# Patient Record
Sex: Female | Born: 1950
Health system: Southern US, Community
[De-identification: ages and names within clinical notes are randomized; demographics above are authoritative.]

## PROBLEM LIST (undated history)

## (undated) DIAGNOSIS — G8929 Other chronic pain: Secondary | ICD-10-CM

## (undated) DIAGNOSIS — R011 Cardiac murmur, unspecified: Secondary | ICD-10-CM

## (undated) DIAGNOSIS — S32599A Other specified fracture of unspecified pubis, initial encounter for closed fracture: Secondary | ICD-10-CM

## (undated) DIAGNOSIS — M3500 Sicca syndrome, unspecified: Secondary | ICD-10-CM

## (undated) DIAGNOSIS — M545 Low back pain, unspecified: Secondary | ICD-10-CM

## (undated) DIAGNOSIS — F329 Major depressive disorder, single episode, unspecified: Secondary | ICD-10-CM

## (undated) DIAGNOSIS — I499 Cardiac arrhythmia, unspecified: Secondary | ICD-10-CM

## (undated) DIAGNOSIS — F32A Depression, unspecified: Secondary | ICD-10-CM

## (undated) DIAGNOSIS — G2581 Restless legs syndrome: Secondary | ICD-10-CM

## (undated) DIAGNOSIS — R059 Cough, unspecified: Secondary | ICD-10-CM

## (undated) DIAGNOSIS — I10 Essential (primary) hypertension: Secondary | ICD-10-CM

## (undated) DIAGNOSIS — R05 Cough: Secondary | ICD-10-CM

## (undated) DIAGNOSIS — R002 Palpitations: Secondary | ICD-10-CM

## (undated) DIAGNOSIS — R0600 Dyspnea, unspecified: Secondary | ICD-10-CM

## (undated) DIAGNOSIS — M1990 Unspecified osteoarthritis, unspecified site: Secondary | ICD-10-CM

## (undated) DIAGNOSIS — H2012 Chronic iridocyclitis, left eye: Secondary | ICD-10-CM

## (undated) DIAGNOSIS — K219 Gastro-esophageal reflux disease without esophagitis: Secondary | ICD-10-CM

## (undated) DIAGNOSIS — B029 Zoster without complications: Secondary | ICD-10-CM

## (undated) DIAGNOSIS — F0390 Unspecified dementia without behavioral disturbance: Secondary | ICD-10-CM

## (undated) DIAGNOSIS — F419 Anxiety disorder, unspecified: Secondary | ICD-10-CM

## (undated) DIAGNOSIS — J45909 Unspecified asthma, uncomplicated: Secondary | ICD-10-CM

## (undated) DIAGNOSIS — J449 Chronic obstructive pulmonary disease, unspecified: Secondary | ICD-10-CM

## (undated) DIAGNOSIS — N182 Chronic kidney disease, stage 2 (mild): Secondary | ICD-10-CM

## (undated) DIAGNOSIS — IMO0001 Reserved for inherently not codable concepts without codable children: Secondary | ICD-10-CM

## (undated) HISTORY — DX: Essential (primary) hypertension: I10

## (undated) HISTORY — DX: Cough, unspecified: R05.9

## (undated) HISTORY — PX: CARDIAC CATHETERIZATION: SHX172

## (undated) HISTORY — DX: Unspecified osteoarthritis, unspecified site: M19.90

## (undated) HISTORY — DX: Chronic iridocyclitis, left eye: H20.12

## (undated) HISTORY — DX: Low back pain, unspecified: M54.50

## (undated) HISTORY — DX: Palpitations: R00.2

## (undated) HISTORY — DX: Zoster without complications: B02.9

## (undated) HISTORY — DX: Low back pain: M54.5

## (undated) HISTORY — DX: Depression, unspecified: F32.A

## (undated) HISTORY — DX: Gastro-esophageal reflux disease without esophagitis: K21.9

## (undated) HISTORY — DX: Other chronic pain: G89.29

## (undated) HISTORY — DX: Unspecified dementia without behavioral disturbance: F03.90

## (undated) HISTORY — DX: Anxiety disorder, unspecified: F41.9

## (undated) HISTORY — DX: Unspecified asthma, uncomplicated: J45.909

## (undated) HISTORY — PX: BACK SURGERY: SHX140

## (undated) HISTORY — DX: Major depressive disorder, single episode, unspecified: F32.9

## (undated) HISTORY — DX: Chronic obstructive pulmonary disease, unspecified: J44.9

## (undated) HISTORY — DX: Restless legs syndrome: G25.81

## (undated) HISTORY — DX: Cough: R05

## (undated) HISTORY — PX: TUBAL LIGATION: SHX77

## (undated) HISTORY — DX: Sjogren syndrome, unspecified: M35.00

---

## 1999-06-04 ENCOUNTER — Encounter: Payer: Self-pay | Admitting: Family Medicine

## 1999-06-04 ENCOUNTER — Encounter: Admission: RE | Admit: 1999-06-04 | Discharge: 1999-06-04 | Payer: Self-pay | Admitting: Family Medicine

## 1999-06-24 ENCOUNTER — Encounter: Payer: Self-pay | Admitting: Neurosurgery

## 1999-06-24 ENCOUNTER — Ambulatory Visit (HOSPITAL_COMMUNITY): Admission: RE | Admit: 1999-06-24 | Discharge: 1999-06-24 | Payer: Self-pay | Admitting: Neurosurgery

## 1999-07-08 ENCOUNTER — Ambulatory Visit (HOSPITAL_COMMUNITY): Admission: RE | Admit: 1999-07-08 | Discharge: 1999-07-08 | Payer: Self-pay | Admitting: Neurosurgery

## 1999-07-08 ENCOUNTER — Encounter: Payer: Self-pay | Admitting: Neurosurgery

## 1999-07-28 ENCOUNTER — Ambulatory Visit (HOSPITAL_COMMUNITY): Admission: RE | Admit: 1999-07-28 | Discharge: 1999-07-28 | Payer: Self-pay | Admitting: Neurosurgery

## 1999-07-28 ENCOUNTER — Encounter: Payer: Self-pay | Admitting: Neurosurgery

## 1999-08-09 ENCOUNTER — Encounter: Payer: Self-pay | Admitting: Neurosurgery

## 1999-08-11 ENCOUNTER — Inpatient Hospital Stay (HOSPITAL_COMMUNITY): Admission: RE | Admit: 1999-08-11 | Discharge: 1999-08-15 | Payer: Self-pay | Admitting: Neurosurgery

## 1999-08-11 ENCOUNTER — Encounter: Payer: Self-pay | Admitting: Neurosurgery

## 1999-08-20 ENCOUNTER — Encounter: Admission: RE | Admit: 1999-08-20 | Discharge: 1999-08-20 | Payer: Self-pay | Admitting: Neurosurgery

## 1999-08-20 ENCOUNTER — Encounter: Payer: Self-pay | Admitting: Neurosurgery

## 1999-10-04 ENCOUNTER — Encounter: Admission: RE | Admit: 1999-10-04 | Discharge: 1999-10-04 | Payer: Self-pay | Admitting: Neurosurgery

## 1999-10-04 ENCOUNTER — Encounter: Payer: Self-pay | Admitting: Neurosurgery

## 1999-10-06 ENCOUNTER — Other Ambulatory Visit: Admission: RE | Admit: 1999-10-06 | Discharge: 1999-10-06 | Payer: Self-pay | Admitting: *Deleted

## 1999-11-26 ENCOUNTER — Encounter (INDEPENDENT_AMBULATORY_CARE_PROVIDER_SITE_OTHER): Payer: Self-pay

## 1999-11-26 ENCOUNTER — Other Ambulatory Visit: Admission: RE | Admit: 1999-11-26 | Discharge: 1999-11-26 | Payer: Self-pay | Admitting: Obstetrics and Gynecology

## 2000-01-12 ENCOUNTER — Encounter: Payer: Self-pay | Admitting: Neurosurgery

## 2000-01-12 ENCOUNTER — Encounter: Admission: RE | Admit: 2000-01-12 | Discharge: 2000-01-12 | Payer: Self-pay | Admitting: Neurosurgery

## 2000-03-16 ENCOUNTER — Ambulatory Visit (HOSPITAL_COMMUNITY): Admission: RE | Admit: 2000-03-16 | Discharge: 2000-03-16 | Payer: Self-pay | Admitting: Neurosurgery

## 2000-03-16 ENCOUNTER — Encounter: Payer: Self-pay | Admitting: Neurosurgery

## 2000-04-04 ENCOUNTER — Inpatient Hospital Stay (HOSPITAL_COMMUNITY): Admission: RE | Admit: 2000-04-04 | Discharge: 2000-04-08 | Payer: Self-pay | Admitting: Neurosurgery

## 2000-04-04 ENCOUNTER — Encounter: Payer: Self-pay | Admitting: Neurosurgery

## 2000-04-28 ENCOUNTER — Encounter: Admission: RE | Admit: 2000-04-28 | Discharge: 2000-04-28 | Payer: Self-pay | Admitting: Neurosurgery

## 2000-04-28 ENCOUNTER — Encounter: Payer: Self-pay | Admitting: Neurosurgery

## 2000-07-20 ENCOUNTER — Encounter: Payer: Self-pay | Admitting: Neurosurgery

## 2000-07-20 ENCOUNTER — Encounter: Admission: RE | Admit: 2000-07-20 | Discharge: 2000-07-20 | Payer: Self-pay | Admitting: Neurosurgery

## 2000-08-23 ENCOUNTER — Encounter: Payer: Self-pay | Admitting: Neurosurgery

## 2000-08-23 ENCOUNTER — Encounter: Admission: RE | Admit: 2000-08-23 | Discharge: 2000-08-23 | Payer: Self-pay | Admitting: Neurosurgery

## 2001-01-03 ENCOUNTER — Ambulatory Visit (HOSPITAL_COMMUNITY): Admission: RE | Admit: 2001-01-03 | Discharge: 2001-01-03 | Payer: Self-pay | Admitting: Gastroenterology

## 2001-06-29 ENCOUNTER — Other Ambulatory Visit: Admission: RE | Admit: 2001-06-29 | Discharge: 2001-06-29 | Payer: Self-pay | Admitting: Obstetrics and Gynecology

## 2003-01-30 ENCOUNTER — Other Ambulatory Visit: Admission: RE | Admit: 2003-01-30 | Discharge: 2003-01-30 | Payer: Self-pay | Admitting: Obstetrics and Gynecology

## 2003-07-30 ENCOUNTER — Encounter: Admission: RE | Admit: 2003-07-30 | Discharge: 2003-07-30 | Payer: Self-pay | Admitting: Obstetrics and Gynecology

## 2004-06-09 ENCOUNTER — Encounter: Admission: RE | Admit: 2004-06-09 | Discharge: 2004-06-09 | Payer: Self-pay | Admitting: Neurosurgery

## 2004-07-15 ENCOUNTER — Encounter: Admission: RE | Admit: 2004-07-15 | Discharge: 2004-07-15 | Payer: Self-pay | Admitting: Neurosurgery

## 2004-07-28 ENCOUNTER — Encounter: Admission: RE | Admit: 2004-07-28 | Discharge: 2004-07-28 | Payer: Self-pay | Admitting: Neurosurgery

## 2004-09-15 ENCOUNTER — Inpatient Hospital Stay (HOSPITAL_BASED_OUTPATIENT_CLINIC_OR_DEPARTMENT_OTHER): Admission: RE | Admit: 2004-09-15 | Discharge: 2004-09-15 | Payer: Self-pay | Admitting: Cardiovascular Disease

## 2005-04-08 ENCOUNTER — Ambulatory Visit: Payer: Self-pay | Admitting: Pulmonary Disease

## 2005-04-27 ENCOUNTER — Ambulatory Visit: Admission: RE | Admit: 2005-04-27 | Discharge: 2005-04-27 | Payer: Self-pay | Admitting: Pulmonary Disease

## 2005-04-27 LAB — PULMONARY FUNCTION TEST

## 2005-06-24 ENCOUNTER — Ambulatory Visit: Payer: Self-pay | Admitting: Pulmonary Disease

## 2005-09-22 ENCOUNTER — Ambulatory Visit: Payer: Self-pay | Admitting: Internal Medicine

## 2005-10-21 ENCOUNTER — Ambulatory Visit: Payer: Self-pay | Admitting: Internal Medicine

## 2005-11-22 ENCOUNTER — Encounter: Admission: RE | Admit: 2005-11-22 | Discharge: 2005-11-22 | Payer: Self-pay | Admitting: Neurosurgery

## 2006-06-26 ENCOUNTER — Encounter: Admission: RE | Admit: 2006-06-26 | Discharge: 2006-06-26 | Payer: Self-pay | Admitting: Obstetrics and Gynecology

## 2006-08-17 ENCOUNTER — Encounter: Admission: RE | Admit: 2006-08-17 | Discharge: 2006-08-17 | Payer: Self-pay | Admitting: Neurosurgery

## 2006-08-23 ENCOUNTER — Encounter: Admission: RE | Admit: 2006-08-23 | Discharge: 2006-08-23 | Payer: Self-pay | Admitting: Neurosurgery

## 2007-06-27 ENCOUNTER — Encounter: Admission: RE | Admit: 2007-06-27 | Discharge: 2007-06-27 | Payer: Self-pay | Admitting: Neurosurgery

## 2007-07-07 ENCOUNTER — Emergency Department (HOSPITAL_COMMUNITY): Admission: EM | Admit: 2007-07-07 | Discharge: 2007-07-07 | Payer: Self-pay | Admitting: Emergency Medicine

## 2007-07-19 ENCOUNTER — Encounter: Admission: RE | Admit: 2007-07-19 | Discharge: 2007-07-19 | Payer: Self-pay | Admitting: Obstetrics and Gynecology

## 2007-08-02 ENCOUNTER — Telehealth (INDEPENDENT_AMBULATORY_CARE_PROVIDER_SITE_OTHER): Payer: Self-pay | Admitting: *Deleted

## 2007-08-06 ENCOUNTER — Ambulatory Visit: Payer: Self-pay | Admitting: Internal Medicine

## 2007-08-06 DIAGNOSIS — R059 Cough, unspecified: Secondary | ICD-10-CM | POA: Insufficient documentation

## 2007-08-06 DIAGNOSIS — I10 Essential (primary) hypertension: Secondary | ICD-10-CM | POA: Insufficient documentation

## 2007-08-06 DIAGNOSIS — R05 Cough: Secondary | ICD-10-CM

## 2007-08-13 ENCOUNTER — Ambulatory Visit: Payer: Self-pay | Admitting: Internal Medicine

## 2007-10-26 ENCOUNTER — Encounter: Admission: RE | Admit: 2007-10-26 | Discharge: 2007-10-26 | Payer: Self-pay | Admitting: Neurosurgery

## 2009-03-30 ENCOUNTER — Encounter: Admission: RE | Admit: 2009-03-30 | Discharge: 2009-03-30 | Payer: Self-pay | Admitting: Neurosurgery

## 2009-06-08 ENCOUNTER — Encounter: Admission: RE | Admit: 2009-06-08 | Discharge: 2009-06-08 | Payer: Self-pay | Admitting: Family Medicine

## 2009-10-15 DIAGNOSIS — IMO0002 Reserved for concepts with insufficient information to code with codable children: Secondary | ICD-10-CM | POA: Insufficient documentation

## 2009-10-15 DIAGNOSIS — Z981 Arthrodesis status: Secondary | ICD-10-CM | POA: Insufficient documentation

## 2009-10-15 DIAGNOSIS — M4120 Other idiopathic scoliosis, site unspecified: Secondary | ICD-10-CM | POA: Insufficient documentation

## 2010-02-03 ENCOUNTER — Encounter: Admission: RE | Admit: 2010-02-03 | Discharge: 2010-02-03 | Payer: Self-pay | Admitting: Family Medicine

## 2010-05-05 ENCOUNTER — Encounter
Admission: RE | Admit: 2010-05-05 | Discharge: 2010-05-05 | Payer: Self-pay | Source: Home / Self Care | Attending: Neurosurgery | Admitting: Neurosurgery

## 2010-05-14 ENCOUNTER — Encounter
Admission: RE | Admit: 2010-05-14 | Discharge: 2010-05-14 | Payer: Self-pay | Source: Home / Self Care | Attending: Neurosurgery | Admitting: Neurosurgery

## 2010-06-02 ENCOUNTER — Encounter
Admission: RE | Admit: 2010-06-02 | Discharge: 2010-06-02 | Payer: Self-pay | Source: Home / Self Care | Attending: Neurosurgery | Admitting: Neurosurgery

## 2010-06-12 ENCOUNTER — Encounter: Payer: Self-pay | Admitting: Obstetrics and Gynecology

## 2010-06-13 ENCOUNTER — Encounter: Payer: Self-pay | Admitting: Neurosurgery

## 2010-10-08 NOTE — Discharge Summary (Signed)
[place]. [facility]  Patient:    Toni Myers, Toni Myers                         MRN: [id number] Adm. Date:  [id number] Disc. Date: 04/08/00 Attending:  Emeterio Reeve                           Discharge Summary  ADMISSION DIAGNOSES: 1. Chronic back pain. 2. Radiculopathy.  FINAL DIAGNOSES: 1. Chronic back pain. 2. Radiculopathy.  CLINICAL HISTORY:  The patient was admitted by Dr. Trey Sailors because of back and leg pain.  This patient had fusion at the level of 5-1 a year ago. Because of continuation of back pain, it was found that she had a ______ Surgery was advised.  LABORATORY DATA:  Normal.  COURSE IN HOSPITAL:  The patient was taken to surgery, and Dr. Channing Mutters proceeded with a fusion at the level of 5-1 using pedicle screw.  The patient right now is doing much better, although she has some weakness of the left foot. Nevertheless, she is ready to go home.  CONDITION UPON DISCHARGE:  Improvement.  DISCHARGE MEDICATIONS:  Percocet and diazepam.  DIET:  Regular.  ACTIVITY:  She is not to drive.  FOLLOWUP:  She is call Dr. Channing Mutters for followup appointment next week. DD:  04/08/00 TD:  04/08/00 Job: [id number]

## 2010-10-08 NOTE — H&P (Signed)
Toni Myers, Toni Myers                  ACCOUNT NO.:  [id number]   MEDICAL RECORD NO.:  [id number]          PATIENT TYPE:  AMB   LOCATION:                               FACILITY:  [facility]   PHYSICIAN:  Vesta Mixer, M.D. DATE OF BIRTH:  02-Jun-1950   DATE OF ADMISSION:  DATE OF DISCHARGE:                                HISTORY & PHYSICAL   HISTORY OF PRESENT ILLNESS:  Ms.  Myers is a middle aged female with  history of chest pains.  She has had several Cardiolite studies in the past  which have revealed anterior attenuation.  It was thought originally to be  due to breast artifact.  She has continued to have some intermittent  episodes of chest pain, and she is now referred for heart catheterization.   Ms.  Myers has had Cardiolite studies over the past year or so.  She has  had attenuation that has appeared to be primarily a fixed defect.  She  continues to have episodes of palpitations and fatigue.  She has episodes of  chest tightness with any sort of exertion.  She also has been diagnosed as  having the early stages of COPD, although, she has never smoked.  She denies  any syncope or presyncope.  She denies any PND or orthopnea.  She does not  get any regular aerobic exercise.   CURRENT MEDICATIONS:  1.  Fosamax once a week.  2.  Toprol XL 50 mg daily.  3.  Celexa 20 mg daily.  4.  Calcium once daily.  5.  Rhinocort two sprays a day.   ALLERGIES:  None.   PAST MEDICAL HISTORY:  1.  History of chest pains.  2.  Early COPD.  3.  Mild aortic sclerosis.   SOCIAL HISTORY:  The patient does not smoke.  She is retired from [employer]  [employer].  She does not drink alcohol.   FAMILY HISTORY:  Father died at age 82 due to cirrhosis.  Her mother is 36  years old and is relatively healthy.   REVIEW OF SYSTEMS:  Reviewed and is essentially negative.   PHYSICAL EXAMINATION:  GENERAL APPEARANCE:  She is a middle age female in no  acute distress.  She is alert and oriented x3.   Mood and affect are normal.  VITAL SIGNS:  Weight is 145, blood pressure 110/80, heart rate 60.  HEENT:  Carotids 2+.  No bruits or JVD.  No thyromegaly.  LUNGS:  Clear to auscultation.  HEART:  Regular rate, S1, S2.  She has a very soft systolic outflow murmur.  ABDOMEN:  Good bowel sounds and is nontender.  EXTREMITIES:  No cyanosis, clubbing or edema.  NEUROLOGICAL:  Nonfocal.   LABORATORY DATA:  Normal pro time.  Her chem-7 reveals a potassium of 5.2,  creatinine 0.7, otherwise, unremarkable.  CBC reveals hemoglobin of 13.4  with platelets 289,000.   PLAN:  We have discussed the risks, benefits and options of heart  catheterization with Ms. Toni Myers.  She understands and agrees to proceed.       ___________________________________________  Vesta Mixer, M.D.    PJN/MEDQ  D:  09/13/2004  T:  09/13/2004  Job:  [id number]   cc:   Lonie Peak

## 2010-10-08 NOTE — Op Note (Signed)
[place]. [facility]  Patient:    Toni Myers, Toni Myers                         MRN: [id number] Proc. Date: 08/11/99 Adm. Date:  [id number] Disc. Date: [id number] Attending:  Emeterio Reeve                           Operative Report  PREOPERATIVE DIAGNOSIS:  Spondylosis at L5-S1.  POSTOPERATIVE DIAGNOSIS:  Spondylosis with herniated disk at L5-S1.  SURGEON:  Payton Doughty, M.D.  ANESTHESIA:  General endotracheal.  PREP:  Shave and prepped, scrubbed with alcohol wipe.  COMPLICATIONS:  None.  BODY OF TEXT:  This is a 60 year old lady, who has had back and left leg pain ever since a fall.  She was taken to the operating room, smoothly anesthetized, intubated and placed prone on the operating table.  Following shave, prepped and draped in usual sterile fashion.  Skin was infiltrated with 1% lidocaine with 1:400,000 epinephrine.  Skin was incised from the bottom of L4 to the top of S1. The lamina of L5 and S1 and then L5-S1 facet joints were exposed bilaterally in the subperiosteal plane.  Intraoperative x-ray confirmed correctness level.  The pars interarticularis, lamina and inferior facet of L5 and the superior facet of S1 ere removed bilaterally using the high-speed drill and the bone saved for bone grafting.  The ligamentum flavum was removed and the L5 and S1 nerve roots carefully dissected free.  This exposed the 5-1 disk space.  On the left side, there was an obvious disk herniation with disruption of the annulus and fragment protruding, but not completely extruded from the disk space.  The right side, there was some small annular tear, but no extruded disk material.  Diskectomy was carried out completely.  A 14 x 21 mm Ray threaded fusion cages were then placed bilaterally.  Intraoperative x-ray showed good placement of cages and there was a good tight fit upon placement.  They were packed with bone graft harvested from  facet joints and  intraoperative x-ray showed good placement of cages.  After packing, they were capped.  The wound was irrigated and hemostasis assured. The fascia was reapproximated with 0 Vicryl in interrupted fashion, subcutaneous tissue was reapproximated with 0 Vicryl in interrupted fashion, the subcuticular tissues were reapproximated with 0 Vicryl interrupted fashion.  The skin was closed with 3-0 nylon running lock fashion.  Betadine, Telfa dressing was applied and made occlusive with OpSite.  The patient returned recovery room in good condition.  DD:  08/11/99 TD:  08/11/99 Job: [id number]

## 2010-10-08 NOTE — Procedures (Signed)
[facility]  Patient:    Toni Myers, Toni Myers                           MRN: [id number] Proc. Date: 01/03/01 Attending:  Verlin Grills, M.D. CC:         Helene Kelp, M.D.   Procedure Report  PROCEDURE:  Flexible proctosigmoidoscopy.  INDICATION:  [name] (date of birth January 03, 1951) is a 60 year old female who is due for her first surveillance colonoscopy with polypectomy to prevent colon cancer.  Unfortunately, [name] was able to consume only one-half of her Fleets Phospho-Soda colonic prep for the examination.  The colon was inadequately prepped for a complete colonoscopy.  A surveillance flexible proctosigmoidoscopy was performed.  ENDOSCOPIST:  Verlin Grills, M.D.  PREMEDICATION:  Versed 15 mg, Demerol 50 mg.  ENDOSCOPE:  Olympus pediatric colonoscope.  DESCRIPTION OF PROCEDURE:  After obtaining informed consent, [name] was placed in the left lateral decubitus position. Anal inspection was normal.  Digital rectal exam was normal.  The Olympus pediatric colonoscope was introduced into the rectum and advanced to approximately 70 cm from the anal verge.  There was a large amount of solid stool filling the left colon.  Endoscopic appearance of the rectum was normal. Endoscopic appearance of the mucosa which I could visualize in the left colon with the endoscope advanced to 70 cm from the anal verge was normal.  I did not identify any endoscopic evidence of colorectal neoplasia.  ASSESSMENT:  Normal flexible proctosigmoidoscopy performed to 70 cm.  There was a large amount of solid stool filling the left colon.  A complete colonoscopy was not performed. DD:  01/03/01 TD:  01/03/01 Job: [id number]

## 2010-10-08 NOTE — Op Note (Signed)
[place]. [facility]  Patient:    Toni Myers, Toni Myers                         MRN: [id number] Proc. Date: 04/04/00 Adm. Date:  [id number] Attending:  Emeterio Reeve                           Operative Report  PREOPERATIVE DIAGNOSIS:   Nonunion at L5-S1.  POSTOPERATIVE DIAGNOSIS:   Nonunion at L5-S1.  OPERATION PERFORMED:  L5-S1 pedicle screw augmentation and fixation.  ______ neurosurgery.  ANESTHESIA:  General endotracheal.  PREP:  ______ prep with the alcohol wipe.  COMPLICATIONS:  None.  INDICATIONS FOR PROCEDURE:  A 60 year old right handed white lady had an effusion in March and has a nonunion. Taken to the operating room ______ each side intubated, placed prone on the operating table. Following shave, prep and drape in the usual sterile fashion, the skin was infiltrated with 1% lidocaine with 1:400,000 epinephrine. Her old skin incision was reopened and extended approximately a centimeter at each end. The L4 spinous process and the L1 spinous process and the L5 spinous process were identified. The lamina of 5 and the 4-5 facet joint were identified as well as the remains of the superior articular facet of S1. The sacral ______ and transverses processes of L5 were identified. Using the standard landmarks, pedicle screws were placed in the L5 and S1 pedicles. Intraoperative x-rays were taken and the right sided S1 screw had to be redirected and this was accomplished without difficulty. Once good placement of the screws was ascertained, the connecting rods were placed over top of them. Osteoblast bone matrix was then placed laterally across the transverse processes of L5 and S1 following decortication with a high speed drill. The rods were connected and locked down. Final film showed good placement of rod and screws. The wound was irrigated, hemostasis assured. The fascia was reapproximated with #0 Vicryl in an interrupted fashion, the subcutaneous  tissue was reapproximated with #0 Vicryl in an interrupted fashion. The subcuticular tissue was reapproximated with 3-0 Vicryl in interrupted fashion. The skin was closed with 3-0 nylon in a running locked fashion. A Betadine dressing was applied ______ Op-Site. The patient returned to the recovery room in good condition. DD:  04/04/00 TD:  04/04/00 Job: [id number]

## 2010-10-08 NOTE — Discharge Summary (Signed)
. [facility]  Patient:    Toni Myers, Toni Myers                         MRN: [id number] Adm. Date:  [id number] Disc. Date: [id number] Attending:  Emeterio Reeve                           Discharge Summary  ADMISSION DIAGNOSIS:  L5-S1 spondylosis with lumbar radiculopathy.  DISCHARGE DIAGNOSIS:  L5-S1 spondylosis with lumbar radiculopathy.  HISTORY OF PRESENT ILLNESS: Toni Myers is a 60 year old woman who fell in December 2000 and developed burning low back pain and right lower extremity radiculopathy.  She had a central disk herniation at L5-S1.  She had epidural steroid injections without relief of her pain.  She underwent discography at L5-S1 which was concordant and negative at L4-5, and she was admitted to the hospital on a same-day-as-procedure basis by Dr. Trey Sailors for lumbar fusion at L5-S1 for spondylosis.  HOSPITAL COURSE: Toni Myers underwent uncomplicated laminectomy and Ray cage fusion at L5-S1 on August 11, 1999.  She did well with surgery.  She had postoperative urinary retention which required catheterization.  She had full strength in her lower extremities on August 13, 1999.  The Foley catheter was discontinued and the patient was able to void well as of August 14, 1999.  She was slowly mobilized by August 15, 1999 and doing well in terms of low back pain and lumbar radicular complaints.  She had full strength on confrontational testing and her incision appeared to be healing well.  The patient was discharged home on August 15, 1999 in stable and satisfactory condition, having tolerated the operation and hospitalization well.  DISCHARGE MEDICATIONS:  1. Vicodin 5/100 1 to 2 q.4h as-needed for pain.  2. Flexeril 10 mg up to t.i.d. as-needed for spasm.  3. She was instructed to restart her regular home medications, which include:     a. Paxil 10 mg q.d.     b. Premarin 1.25 mg q.d.  4. Use stool softener as-needed.  DISCHARGE INSTRUCTIONS:   She is to wear her brace when up.  No lifting, bending, twisting, or driving.  Okay to shower, not to soak her incision.  FOLLOW-UP:  She is to follow up in one week with Dr. Channing Mutters for suture removal, call Monday for an appointment. DD:  08/15/99 TD:  08/16/99 Job: [id number]

## 2010-10-08 NOTE — Cardiovascular Report (Signed)
Toni Myers, Toni Myers                  ACCOUNT NO.:  [id number]   MEDICAL RECORD NO.:  [id number]          PATIENT TYPE:  OIB   LOCATION:  6501                         FACILITY:  [facility]   PHYSICIAN:  Vesta Mixer, M.D. DATE OF BIRTH:  1951-03-22   DATE OF PROCEDURE:  09/15/2004  DATE OF DISCHARGE:                              CARDIAC CATHETERIZATION   This is a 60 year old female with a history of chest pains. She also has  mild mitral regurgitation. She has had a Cardiolite study in the past which  revealed anterior apical ischemia. She is referred for heart catheterization  because of her further episodes of chest pressure.   PROCEDURE:  Left heart catheterization with coronary angiography.   The right femoral artery was easily cannulated using the modified Seldinger  technique.   HEMODYNAMICS:  LV pressure was 144/23 with an aortic pressure of 146/86.   ANGIOGRAPHY:  Left main is smooth and normal.   The left anterior descending artery is relatively smooth and normal. The  first diagonal vessel is a fairly large vessel and is normal.   The ramus intermediate branch is a moderate to large vessel. It is smooth  and normal throughout its course.   The left circumflex artery is a relatively small vessel because of the large  ramus intermediate branch. It is smooth and normal throughout its course.  The first obtuse marginal artery is normal. The circumflex ended in a small  posterolateral branch which is normal.   The right coronary artery is large and dominant. It is smooth and normal  throughout its course.   The left ventriculogram was performed in a 30 RAO position. It reveals  normal left ventricular systolic function. There is mild mitral  regurgitation. The ejection fraction around 60-65%.   COMPLICATIONS:  None.   CONCLUSION:  1.  Smooth and normal coronary arteries.  2.  Normal left ventricular systolic function. The patient has been told to      take SB  prophylaxis prior to having dental procedures.     PJN/MEDQ  D:  09/15/2004  T:  09/15/2004  Job:  [id number]

## 2010-10-14 ENCOUNTER — Other Ambulatory Visit: Payer: Self-pay | Admitting: Neurosurgery

## 2010-10-14 DIAGNOSIS — M419 Scoliosis, unspecified: Secondary | ICD-10-CM

## 2010-10-16 ENCOUNTER — Ambulatory Visit
Admission: RE | Admit: 2010-10-16 | Discharge: 2010-10-16 | Disposition: A | Discharge status: Home / Self Care | Payer: No Typology Code available for payment source | Source: Ambulatory Visit | Attending: Neurosurgery | Admitting: Neurosurgery

## 2010-10-16 DIAGNOSIS — M419 Scoliosis, unspecified: Secondary | ICD-10-CM

## 2011-01-04 ENCOUNTER — Other Ambulatory Visit: Payer: Self-pay | Admitting: Neurosurgery

## 2011-01-04 DIAGNOSIS — M47812 Spondylosis without myelopathy or radiculopathy, cervical region: Secondary | ICD-10-CM

## 2011-01-13 ENCOUNTER — Other Ambulatory Visit: Payer: No Typology Code available for payment source

## 2011-01-15 ENCOUNTER — Other Ambulatory Visit: Payer: No Typology Code available for payment source

## 2011-01-17 ENCOUNTER — Ambulatory Visit
Admission: RE | Admit: 2011-01-17 | Discharge: 2011-01-17 | Disposition: A | Discharge status: Home / Self Care | Payer: PRIVATE HEALTH INSURANCE | Source: Ambulatory Visit | Attending: Neurosurgery | Admitting: Neurosurgery

## 2011-01-17 DIAGNOSIS — M47812 Spondylosis without myelopathy or radiculopathy, cervical region: Secondary | ICD-10-CM

## 2011-07-12 ENCOUNTER — Other Ambulatory Visit: Payer: Self-pay | Admitting: Obstetrics and Gynecology

## 2011-07-12 DIAGNOSIS — N644 Mastodynia: Secondary | ICD-10-CM

## 2011-07-20 ENCOUNTER — Ambulatory Visit
Admission: RE | Admit: 2011-07-20 | Discharge: 2011-07-20 | Disposition: A | Discharge status: Home / Self Care | Payer: Medicare Other | Source: Ambulatory Visit | Attending: Obstetrics and Gynecology | Admitting: Obstetrics and Gynecology

## 2011-07-20 DIAGNOSIS — N644 Mastodynia: Secondary | ICD-10-CM

## 2011-07-28 ENCOUNTER — Other Ambulatory Visit: Payer: Self-pay | Admitting: Neurosurgery

## 2011-07-28 DIAGNOSIS — M545 Low back pain: Secondary | ICD-10-CM

## 2011-07-30 ENCOUNTER — Ambulatory Visit
Admission: RE | Admit: 2011-07-30 | Discharge: 2011-07-30 | Disposition: A | Discharge status: Home / Self Care | Payer: Medicare Other | Source: Ambulatory Visit | Attending: Neurosurgery | Admitting: Neurosurgery

## 2011-07-30 DIAGNOSIS — M545 Low back pain: Secondary | ICD-10-CM

## 2011-08-11 ENCOUNTER — Other Ambulatory Visit: Payer: Self-pay | Admitting: Neurosurgery

## 2011-08-11 DIAGNOSIS — R102 Pelvic and perineal pain: Secondary | ICD-10-CM

## 2011-08-14 ENCOUNTER — Ambulatory Visit
Admission: RE | Admit: 2011-08-14 | Discharge: 2011-08-14 | Disposition: A | Discharge status: Home / Self Care | Payer: Medicare Other | Source: Ambulatory Visit | Attending: Neurosurgery | Admitting: Neurosurgery

## 2011-08-14 DIAGNOSIS — R102 Pelvic and perineal pain: Secondary | ICD-10-CM

## 2011-08-31 ENCOUNTER — Telehealth: Payer: Self-pay | Admitting: Obstetrics and Gynecology

## 2011-08-31 ENCOUNTER — Encounter: Payer: Self-pay | Admitting: Emergency Medicine

## 2011-08-31 ENCOUNTER — Encounter: Payer: Self-pay | Admitting: *Deleted

## 2011-08-31 DIAGNOSIS — J45901 Unspecified asthma with (acute) exacerbation: Secondary | ICD-10-CM | POA: Insufficient documentation

## 2011-08-31 DIAGNOSIS — M3500 Sicca syndrome, unspecified: Secondary | ICD-10-CM | POA: Insufficient documentation

## 2011-08-31 DIAGNOSIS — F329 Major depressive disorder, single episode, unspecified: Secondary | ICD-10-CM | POA: Insufficient documentation

## 2011-08-31 DIAGNOSIS — R002 Palpitations: Secondary | ICD-10-CM | POA: Insufficient documentation

## 2011-08-31 DIAGNOSIS — G2581 Restless legs syndrome: Secondary | ICD-10-CM | POA: Insufficient documentation

## 2011-08-31 DIAGNOSIS — F32A Depression, unspecified: Secondary | ICD-10-CM | POA: Insufficient documentation

## 2011-09-01 ENCOUNTER — Ambulatory Visit (INDEPENDENT_AMBULATORY_CARE_PROVIDER_SITE_OTHER): Payer: Medicare Other | Admitting: Emergency Medicine

## 2011-09-01 ENCOUNTER — Encounter: Payer: Self-pay | Admitting: Emergency Medicine

## 2011-09-01 VITALS — BP 102/78 | HR 71 | Temp 97.9°F | Ht 65.0 in | Wt 157.0 lb

## 2011-09-01 DIAGNOSIS — R05 Cough: Secondary | ICD-10-CM

## 2011-09-01 DIAGNOSIS — R059 Cough, unspecified: Secondary | ICD-10-CM

## 2011-09-01 MED ORDER — ESOMEPRAZOLE MAGNESIUM 40 MG PO CPDR: 40.0000 mg | DELAYED_RELEASE_CAPSULE | Freq: Two times a day (BID) | ORAL | Status: DC

## 2011-09-01 MED ORDER — FLUTICASONE PROPIONATE 50 MCG/ACT NA SUSP: 2.0000 | Freq: Two times a day (BID) | NASAL | Status: DC

## 2011-09-01 MED ORDER — LORATADINE 10 MG PO TABS: 10.0000 mg | ORAL_TABLET | Freq: Every day | ORAL | Status: DC

## 2011-09-01 NOTE — Patient Instructions (Signed)
Please increase your nexium to twice a day for the next 2 weeks, then go back to once daily Start loratadine 10mg  daily Start fluticasone nasal spray, 2 sprays each nostril twice a day Follow the cyclical cough recommendations Stop Spiriva and Symbicort Follow with Dr Delton Coombes in 3 -4 weeks to review.

## 2011-09-01 NOTE — Assessment & Plan Note (Signed)
Will treat GERD, allergies, stop inhaled meds, cyclical cough program. If unsuccessful then consider PFT and FOB. ROV 3-4 weeks

## 2011-09-01 NOTE — Progress Notes (Signed)
Subjective:    Patient ID: Toni Myers, female    DOB: August 24, 1950, 61 y.o.   MRN: [id number]  HPI 61 yo woman, never smoker, hx of Sjogren's, HTN, depression. Has a hx of recurrent cough usually worst in Fall/Spring. She was well until about 5 months ago, then after URI about 5 months ago she has had persistent cough, rarely productive. No real nasal gtt or congestion. She was started on Symbicort 71yrs ago, only using prn, then was started on Spiriva more recently - has only used a few times. Has used ProAir prn.    Review of Systems  Constitutional: Negative.  Negative for fever and unexpected weight change.  HENT: Negative.  Negative for ear pain, nosebleeds, congestion, sore throat, rhinorrhea, sneezing, trouble swallowing, dental problem, postnasal drip and sinus pressure.   Eyes: Negative.  Negative for redness and itching.  Respiratory: Positive for cough and shortness of breath. Negative for chest tightness and wheezing.   Cardiovascular: Negative for palpitations and leg swelling.  Gastrointestinal: Negative.  Negative for nausea and vomiting.  Genitourinary: Negative.  Negative for dysuria.  Musculoskeletal: Positive for arthralgias. Negative for joint swelling.  Skin: Negative.  Negative for rash.  Neurological: Negative.  Negative for headaches.  Hematological: Negative.  Does not bruise/bleed easily.  Psychiatric/Behavioral: Negative for dysphoric mood. The patient is nervous/anxious.    Past Medical History  Diagnosis Date  . Hypertension   . Cough   . Depression   . COPD (chronic obstructive pulmonary disease)   . RLS (restless legs syndrome)   . Chronic low back pain   . Migraine   . Palpitations   . Sjogrens syndrome   . Anxiety      Family History  Problem Relation Age of Onset  . Arthritis Mother      History   Social History  . Marital Status: Divorced    Spouse Name: N/A    Number of Children: N/A  . Years of Education: N/A   Occupational History    . Not on file.   Social History Main Topics  . Smoking status: Never Smoker   . Smokeless tobacco: Not on file  . Alcohol Use: Yes     2 glasses monthly  . Drug Use: No  . Sexually Active: Not on file   Other Topics Concern  . Not on file   Social History Narrative  . No narrative on file     No Known Allergies   Outpatient Prescriptions Prior to Visit  Medication Sig Dispense Refill  . esomeprazole (NEXIUM) 40 MG capsule Take 40 mg by mouth daily before breakfast.      . oxyCODONE-acetaminophen (PERCOCET) 5-325 MG per tablet Take 1 tablet by mouth every 4 (four) hours as needed.      Marland Kitchen albuterol (PROVENTIL HFA;VENTOLIN HFA) 108 (90 BASE) MCG/ACT inhaler Inhale 2 puffs into the lungs every 6 (six) hours as needed.      . budesonide-formoterol (SYMBICORT) 160-4.5 MCG/ACT inhaler Inhale 2 puffs into the lungs 2 (two) times daily.      . clonazePAM (KLONOPIN) 1 MG tablet Take 1 mg by mouth daily as needed.      Marland Kitchen escitalopram (LEXAPRO) 10 MG tablet Take 10 mg by mouth daily.      . metoprolol succinate (TOPROL-XL) 50 MG 24 hr tablet Take 50 mg by mouth daily. Take with or immediately following a meal.      . pregabalin (LYRICA) 75 MG capsule Take 75 mg by mouth  2 (two) times daily.      . temazepam (RESTORIL) 30 MG capsule Take 30 mg by mouth at bedtime as needed.      . traMADol (ULTRAM) 50 MG tablet Take 50 mg by mouth every 6 (six) hours as needed.             Objective:   Physical Exam  Gen: Pleasant, well-nourished, in no distress,  normal affect  ENT: No lesions,  mouth clear,  oropharynx clear, no postnasal drip  Neck: No JVD, no TMG, no carotid bruits  Lungs: No use of accessory muscles, no dullness to percussion, clear without rales or rhonchi  Cardiovascular: RRR, heart sounds normal, no murmur or gallops, no peripheral edema  Musculoskeletal: No deformities, no cyanosis or clubbing  Neuro: alert, non focal  Skin: Warm, no lesions or rashes      Assessment & Plan:  COUGH Will treat GERD, allergies, stop inhaled meds, cyclical cough program. If unsuccessful then consider PFT and FOB. ROV 3-4 weeks

## 2011-09-14 NOTE — Telephone Encounter (Signed)
Routed to laura/for SR to call Dr. Channing Mutters

## 2011-09-15 NOTE — Telephone Encounter (Signed)
LM for pt that Dr Estanislado Pandy has made several attempts to speak with Dr Channing Mutters.  Will try again and get back with her asap.  ld

## 2011-10-03 ENCOUNTER — Encounter: Payer: Self-pay | Admitting: Emergency Medicine

## 2011-10-03 ENCOUNTER — Ambulatory Visit (INDEPENDENT_AMBULATORY_CARE_PROVIDER_SITE_OTHER): Payer: Medicare Other | Admitting: Emergency Medicine

## 2011-10-03 VITALS — BP 126/84 | HR 106 | Temp 98.5°F | Ht 64.0 in | Wt 153.2 lb

## 2011-10-03 DIAGNOSIS — R05 Cough: Secondary | ICD-10-CM

## 2011-10-03 MED ORDER — HYDROCOD POLST-CHLORPHEN POLST 10-8 MG/5ML PO LQCR: 5.0000 mL | Freq: Two times a day (BID) | ORAL | Status: DC | PRN

## 2011-10-03 MED ORDER — PREDNISONE 50 MG PO TABS: 50.0000 mg | ORAL_TABLET | Freq: Every day | ORAL | Status: DC

## 2011-10-03 NOTE — Progress Notes (Signed)
  Subjective:    Patient ID: Toni Myers, female    DOB: 1951/02/02, 61 y.o.   MRN: [id number] HPI 61 yo woman, never smoker, hx of Sjogren's, HTN, depression. Has a hx of recurrent cough usually worst in Fall/Spring. She was well until about 5 months ago, then after URI about 5 months ago she has had persistent cough, rarely productive. No real nasal gtt or congestion. She was started on Symbicort 20yrs ago, only using prn, then was started on Spiriva more recently - has only used a few times. Has used ProAir prn.   ROV 10/03/11 -- Hx cough and allergies, presumed UA syndrome. We stopped BD's, started loratadine + fluticasone, increased nexium to bid, followed cyclical cough protocol. She was initially some better, never cough-free. Then last week started having much more trouble.        Objective:   Physical Exam Filed Vitals:   10/03/11 1617  BP: 126/84  Pulse: 106  Temp: 98.5 F (36.9 C)    Gen: Pleasant, well-nourished, in no distress,  normal affect, very frequent coughing w every breath  ENT: No lesions,  mouth clear,  oropharynx clear, no postnasal drip  Neck: No JVD, no TMG, no carotid bruits  Lungs: No use of accessory muscles, no dullness to percussion, clear without rales or rhonchi  Cardiovascular: RRR, heart sounds normal, no murmur or gallops, no peripheral edema  Musculoskeletal: No deformities, no cyanosis or clubbing  Neuro: alert, non focal  Skin: Warm, no lesions or rashes     Assessment & Plan:  COUGH Severe UA irritation, ? Brought on by an allergic flare (she had an insect bite). Trying to treat allergies, GERD. Will give Pred x 5 days to suppress, give tussionex, practice voice rest. If still coughing may need VCD speech Rx training. Will eval UA with FOB, probable PFT in the future.   Please continue your Nexium twice a day Continue your loratadine and fluticasone nasal spray Take prednisone for 5 days Use Tussionex 5cc every 12 hours for cough Work  hard on establishing a time to practice full voice rest. Refrain from talking, throat clearing.  Follow with Dr Delton Coombes in 3 -4 weeks. If you are still coughing we will consider bronchoscopy, breathing tests and/or possible referral to learn airway relaxation techniques.

## 2011-10-03 NOTE — Patient Instructions (Signed)
Please continue your Nexium twice a day Continue your loratadine and fluticasone nasal spray Take prednisone for 5 days Use Tussionex 5cc every 12 hours for cough Work hard on establishing a time to practice full voice rest. Refrain from talking, throat clearing.  Follow with Dr Delton Coombes in 3 -4 weeks. If you are still coughing we will consider bronchoscopy, breathing tests and/or possible referral to learn airway relaxation techniques.

## 2011-10-03 NOTE — Assessment & Plan Note (Signed)
Severe UA irritation, ? Brought on by an allergic flare (she had an insect bite). Trying to treat allergies, GERD. Will give Pred x 5 days to suppress, give tussionex, practice voice rest. If still coughing may need VCD speech Rx training. Will eval UA with FOB, probable PFT in the future.   Please continue your Nexium twice a day Continue your loratadine and fluticasone nasal spray Take prednisone for 5 days Use Tussionex 5cc every 12 hours for cough Work hard on establishing a time to practice full voice rest. Refrain from talking, throat clearing.  Follow with Dr Delton Coombes in 3 -4 weeks. If you are still coughing we will consider bronchoscopy, breathing tests and/or possible referral to learn airway relaxation techniques.

## 2011-10-06 ENCOUNTER — Telehealth: Payer: Self-pay | Admitting: Emergency Medicine

## 2011-10-06 NOTE — Telephone Encounter (Signed)
Spoke with patient- she is still having severe hoarseness and not getting better. Would like RB to schedule bronch now and follow up afterwards instead of waiting to follow up on 10-14-11 then deciding to schedule bronch. Pt states she will not be home shortly but it is okay to leave a detailed message on her phone or call her tomorrow with decision. Thanks.

## 2011-10-06 NOTE — Telephone Encounter (Signed)
I will need to address tomorrow, will call her then

## 2011-10-07 ENCOUNTER — Ambulatory Visit
Admission: RE | Admit: 2011-10-07 | Discharge: 2011-10-07 | Disposition: A | Discharge status: Home / Self Care | Payer: Medicare Other | Source: Ambulatory Visit | Attending: Family Medicine | Admitting: Family Medicine

## 2011-10-07 ENCOUNTER — Other Ambulatory Visit: Payer: Self-pay | Admitting: Family Medicine

## 2011-10-07 DIAGNOSIS — R05 Cough: Secondary | ICD-10-CM

## 2011-10-07 DIAGNOSIS — R0602 Shortness of breath: Secondary | ICD-10-CM
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
## 2011-10-07 NOTE — Telephone Encounter (Signed)
She has UA obstruction/VCD with several factors irritating her UA. I dont think she needs urgent bronchoscopy, but had planned to schedule a bronch based on her request 5/16. They should eval her, insure she is stable. We are happy to schedule bronchoscopy to eval her UA - this would be diagnostic not therapeutic

## 2011-10-07 NOTE — Telephone Encounter (Signed)
I reviewed the case with Rayfield Citizen, NP, who is taking care of pt at [facility]. She continues to have UA sx but now with greenish sputum, more cough. She apparently had a syncopal episode today, ? In setting paroxysm of cough. CXR was performed today - there was a LLL opacity on the lateral, not clear that this is any different from prior in 2011. She is going to be treated for bronchitis +/- PNA. I will see her on 5/24 as planned, discuss FOB with her at that time.

## 2011-10-07 NOTE — Telephone Encounter (Signed)
We should not do bronchoscopy until she has cleared PNA (if this is what she has). Doxy is fine.
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
## 2011-10-09 ENCOUNTER — Inpatient Hospital Stay (HOSPITAL_COMMUNITY): Payer: Medicare Other

## 2011-10-09 ENCOUNTER — Inpatient Hospital Stay (HOSPITAL_COMMUNITY)
Admission: EM | Admit: 2011-10-09 | Discharge: 2011-10-14 | DRG: 153 | Disposition: A | Discharge status: Home / Self Care | Payer: Medicare Other | Attending: Emergency Medicine | Admitting: Emergency Medicine

## 2011-10-09 ENCOUNTER — Encounter (HOSPITAL_COMMUNITY): Payer: Self-pay | Admitting: *Deleted

## 2011-10-09 DIAGNOSIS — J9819 Other pulmonary collapse: Secondary | ICD-10-CM | POA: Diagnosis present

## 2011-10-09 DIAGNOSIS — J449 Chronic obstructive pulmonary disease, unspecified: Secondary | ICD-10-CM | POA: Diagnosis present

## 2011-10-09 DIAGNOSIS — I10 Essential (primary) hypertension: Secondary | ICD-10-CM

## 2011-10-09 DIAGNOSIS — R002 Palpitations: Secondary | ICD-10-CM

## 2011-10-09 DIAGNOSIS — F411 Generalized anxiety disorder: Secondary | ICD-10-CM | POA: Diagnosis present

## 2011-10-09 DIAGNOSIS — R918 Other nonspecific abnormal finding of lung field: Secondary | ICD-10-CM

## 2011-10-09 DIAGNOSIS — R55 Syncope and collapse: Secondary | ICD-10-CM

## 2011-10-09 DIAGNOSIS — R0600 Dyspnea, unspecified: Secondary | ICD-10-CM

## 2011-10-09 DIAGNOSIS — G8929 Other chronic pain: Secondary | ICD-10-CM | POA: Diagnosis present

## 2011-10-09 DIAGNOSIS — J4489 Other specified chronic obstructive pulmonary disease: Secondary | ICD-10-CM | POA: Diagnosis present

## 2011-10-09 DIAGNOSIS — J189 Pneumonia, unspecified organism: Secondary | ICD-10-CM

## 2011-10-09 DIAGNOSIS — F32A Depression, unspecified: Secondary | ICD-10-CM

## 2011-10-09 DIAGNOSIS — R05 Cough: Secondary | ICD-10-CM

## 2011-10-09 DIAGNOSIS — F329 Major depressive disorder, single episode, unspecified: Secondary | ICD-10-CM

## 2011-10-09 DIAGNOSIS — J329 Chronic sinusitis, unspecified: Principal | ICD-10-CM | POA: Diagnosis present

## 2011-10-09 DIAGNOSIS — M3500 Sicca syndrome, unspecified: Secondary | ICD-10-CM

## 2011-10-09 DIAGNOSIS — F3289 Other specified depressive episodes: Secondary | ICD-10-CM | POA: Diagnosis present

## 2011-10-09 DIAGNOSIS — R059 Cough, unspecified: Secondary | ICD-10-CM

## 2011-10-09 DIAGNOSIS — R0989 Other specified symptoms and signs involving the circulatory and respiratory systems: Secondary | ICD-10-CM

## 2011-10-09 DIAGNOSIS — G43909 Migraine, unspecified, not intractable, without status migrainosus: Secondary | ICD-10-CM | POA: Diagnosis present

## 2011-10-09 DIAGNOSIS — G2581 Restless legs syndrome: Secondary | ICD-10-CM

## 2011-10-09 DIAGNOSIS — M4120 Other idiopathic scoliosis, site unspecified: Secondary | ICD-10-CM | POA: Diagnosis present

## 2011-10-09 LAB — BASIC METABOLIC PANEL
BUN: 10 mg/dL (ref 6–23)
CO2: 26 mEq/L (ref 19–32)
Calcium: 9.9 mg/dL (ref 8.4–10.5)
Chloride: 98 mEq/L (ref 96–112)
Creatinine, Ser: 0.54 mg/dL (ref 0.50–1.10)
GFR calc Af Amer: >90 mL/min (ref >90)
GFR calc non Af Amer: >90 mL/min (ref >90)
Glucose, Bld: 81 mg/dL (ref 70–99)
Potassium: 3.2 mEq/L — ABNORMAL LOW (ref 3.5–5.1)
Sodium: 140 mEq/L (ref 135–145)

## 2011-10-09 LAB — DIFFERENTIAL
Basophils Absolute: 0 10*3/uL (ref 0.0–0.1)
Basophils Relative: 0 % (ref 0–1)
Eosinophils Absolute: 0.2 10*3/uL (ref 0.0–0.7)
Eosinophils Relative: 2 % (ref 0–5)
Lymphocytes Relative: 31 % (ref 12–46)
Lymphs Abs: 2.7 10*3/uL (ref 0.7–4.0)
Monocytes Absolute: 0.9 10*3/uL (ref 0.1–1.0)
Monocytes Relative: 10 % (ref 3–12)
Neutro Abs: 5 10*3/uL (ref 1.7–7.7)
Neutrophils Relative %: 57 % (ref 43–77)

## 2011-10-09 LAB — CBC
HCT: 39.7 % (ref 36.0–46.0)
Hemoglobin: 13.5 g/dL (ref 12.0–15.0)
MCH: 31.1 pg (ref 26.0–34.0)
MCHC: 34 g/dL (ref 30.0–36.0)
MCV: 91.5 fL (ref 78.0–100.0)
Platelets: 444 10*3/uL — ABNORMAL HIGH (ref 150–400)
RBC: 4.34 MIL/uL (ref 3.87–5.11)
RDW: 12.9 % (ref 11.5–15.5)
WBC: 8.7 10*3/uL (ref 4.0–10.5)

## 2011-10-09 MED ORDER — SODIUM CHLORIDE 0.9 % IJ SOLN
3.0000 mL | Freq: Two times a day (BID) | INTRAMUSCULAR | Status: DC
  Administered 2011-10-09 – 2011-10-13 (×9): 3 mL via INTRAVENOUS

## 2011-10-09 MED ORDER — ALBUTEROL SULFATE (5 MG/ML) 0.5% IN NEBU
2.5000 mg | INHALATION_SOLUTION | Freq: Four times a day (QID) | RESPIRATORY_TRACT | Status: DC | PRN
  Administered 2011-10-09: 2.5 mg via RESPIRATORY_TRACT
  Filled 2011-10-09: qty 0.5

## 2011-10-09 MED ORDER — METHYLPREDNISOLONE SODIUM SUCC 125 MG IJ SOLR
125.0000 mg | Freq: Once | INTRAMUSCULAR | Status: AC
  Administered 2011-10-09: 125 mg via INTRAVENOUS
  Filled 2011-10-09 (×2): qty 2

## 2011-10-09 MED ORDER — CITALOPRAM HYDROBROMIDE 40 MG PO TABS
40.0000 mg | ORAL_TABLET | Freq: Every day | ORAL | Status: DC
  Administered 2011-10-09 – 2011-10-14 (×6): 40 mg via ORAL
  Filled 2011-10-09 (×6): qty 1

## 2011-10-09 MED ORDER — ENOXAPARIN SODIUM 40 MG/0.4ML ~~LOC~~ SOLN
40.0000 mg | SUBCUTANEOUS | Status: DC
  Administered 2011-10-09 – 2011-10-13 (×5): 40 mg via SUBCUTANEOUS
  Filled 2011-10-09 (×6): qty 0.4

## 2011-10-09 MED ORDER — HYDROCOD POLST-CHLORPHEN POLST 10-8 MG/5ML PO LQCR
5.0000 mL | Freq: Two times a day (BID) | ORAL | Status: DC
  Administered 2011-10-09 – 2011-10-14 (×9): 5 mL via ORAL
  Filled 2011-10-09 (×9): qty 5

## 2011-10-09 MED ORDER — SODIUM CHLORIDE 0.9 % IJ SOLN: 3.0000 mL | INTRAMUSCULAR | Status: DC | PRN

## 2011-10-09 MED ORDER — SODIUM CHLORIDE 0.9 % IV SOLN
Freq: Once | INTRAVENOUS | Status: AC
  Administered 2011-10-09: 11:00:00 via INTRAVENOUS

## 2011-10-09 MED ORDER — GABAPENTIN 300 MG PO CAPS
300.0000 mg | ORAL_CAPSULE | Freq: Three times a day (TID) | ORAL | Status: DC
  Administered 2011-10-12 – 2011-10-13 (×3): 300 mg via ORAL
  Filled 2011-10-09 (×16): qty 1

## 2011-10-09 MED ORDER — MOXIFLOXACIN HCL IN NACL 400 MG/250ML IV SOLN
400.0000 mg | Freq: Once | INTRAVENOUS | Status: AC
Start: 2011-10-09 — End: 2011-10-09
  Administered 2011-10-09: 400 mg via INTRAVENOUS
  Filled 2011-10-09: qty 250

## 2011-10-09 MED ORDER — MOXIFLOXACIN HCL IN NACL 400 MG/250ML IV SOLN
400.0000 mg | INTRAVENOUS | Status: DC
  Administered 2011-10-10 – 2011-10-13 (×4): 400 mg via INTRAVENOUS
  Filled 2011-10-09 (×4): qty 250

## 2011-10-09 MED ORDER — ALBUTEROL SULFATE (5 MG/ML) 0.5% IN NEBU
5.0000 mg | INHALATION_SOLUTION | Freq: Once | RESPIRATORY_TRACT | Status: AC
  Administered 2011-10-09: 5 mg via RESPIRATORY_TRACT
  Filled 2011-10-09: qty 1

## 2011-10-09 MED ORDER — OXYCODONE-ACETAMINOPHEN 5-325 MG PO TABS: 1.0000 | ORAL_TABLET | Freq: Every day | ORAL | Status: DC | PRN

## 2011-10-09 MED ORDER — ALBUTEROL SULFATE (5 MG/ML) 0.5% IN NEBU
5.0000 mg | INHALATION_SOLUTION | Freq: Once | RESPIRATORY_TRACT | Status: AC
  Administered 2011-10-09: 5 mg via RESPIRATORY_TRACT
  Filled 2011-10-09: qty 1
  Filled 2011-10-09: qty 0.5

## 2011-10-09 MED ORDER — CYCLOBENZAPRINE HCL 10 MG PO TABS
10.0000 mg | ORAL_TABLET | Freq: Every evening | ORAL | Status: DC | PRN
  Filled 2011-10-09: qty 39

## 2011-10-09 MED ORDER — HYDROCOD POLST-CHLORPHEN POLST 10-8 MG/5ML PO LQCR: 5.0000 mL | Freq: Two times a day (BID) | ORAL | Status: DC | PRN

## 2011-10-09 MED ORDER — FLUTICASONE PROPIONATE 50 MCG/ACT NA SUSP
2.0000 | Freq: Two times a day (BID) | NASAL | Status: DC
  Administered 2011-10-09 – 2011-10-14 (×10): 2 via NASAL
  Filled 2011-10-09: qty 16

## 2011-10-09 MED ORDER — PANTOPRAZOLE SODIUM 40 MG PO TBEC
40.0000 mg | DELAYED_RELEASE_TABLET | Freq: Two times a day (BID) | ORAL | Status: DC
  Administered 2011-10-09 – 2011-10-14 (×9): 40 mg via ORAL
  Filled 2011-10-09 (×8): qty 1

## 2011-10-09 MED ORDER — NEBIVOLOL HCL 5 MG PO TABS
5.0000 mg | ORAL_TABLET | Freq: Every day | ORAL | Status: DC
  Administered 2011-10-09 – 2011-10-14 (×6): 5 mg via ORAL
  Filled 2011-10-09 (×6): qty 1

## 2011-10-09 MED ORDER — IOHEXOL 300 MG/ML  SOLN
80.0000 mL | Freq: Once | INTRAMUSCULAR | Status: AC | PRN
  Administered 2011-10-09: 80 mL via INTRAVENOUS

## 2011-10-09 MED ORDER — IPRATROPIUM BROMIDE 0.02 % IN SOLN
0.5000 mg | Freq: Once | RESPIRATORY_TRACT | Status: AC
  Administered 2011-10-09: 0.5 mg via RESPIRATORY_TRACT
  Filled 2011-10-09: qty 2.5

## 2011-10-09 MED ORDER — SODIUM CHLORIDE 0.9 % IV SOLN: 250.0000 mL | INTRAVENOUS | Status: DC | PRN

## 2011-10-09 NOTE — ED Notes (Signed)
Reports being diagnosed with pneumonia on Friday, started on antibiotics with no relief. Having productive cough and sob. Was told by pcp to come here today for possible admission. spo2 99% on room air
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
## 2011-10-09 NOTE — ED Provider Notes (Signed)
History     CSN: [id number]  Arrival date & time 10/09/11  7829   First MD Initiated Contact with Patient 10/09/11 [phone]      Chief Complaint  Patient presents with  . Shortness of Breath    (Consider location/radiation/quality/duration/timing/severity/associated sxs/prior treatment) HPI Comments: Pt with h/o possible COPD, never smoked, seeing Dr. Delton Coombes with pulmonary.  She has had recurring bronchitis and pneumonia since fall of last year.  Most recently, has had fevers, chills, worsening cough and hoarse voice for several days, seen in clinic and had CXR done 3 days ago and started on steroids and doxycycline for pneumonia seen on CXR.  Was told by Dr. Delton Coombes that if no clinical improvement in the next 2 days, to come to the ED.  She doesn't feel any better, has felt lightheaded, faint, poor appetite, SOB with exertion and continues to cough.  No N/V/D.  Is using breathing treatments.    The history is provided by the patient and a relative.    Past Medical History  Diagnosis Date  . Hypertension   . Cough   . Depression   . COPD (chronic obstructive pulmonary disease)   . RLS (restless legs syndrome)   . Chronic low back pain   . Migraine   . Palpitations   . Sjogrens syndrome   . Anxiety     Past Surgical History  Procedure Date  . Back surgery     07/1998 and 03/1999  . Tubal ligation     Family History  Problem Relation Age of Onset  . Arthritis Mother     History  Substance Use Topics  . Smoking status: Never Smoker   . Smokeless tobacco: Never Used  . Alcohol Use: Yes     2 glasses monthly    OB History    Grav Para Term Preterm Abortions TAB SAB Ect Mult Living                  Review of Systems  Constitutional: Positive for fever, chills and fatigue.  HENT: Positive for voice change. Negative for congestion, sore throat and trouble swallowing.   Respiratory: Positive for cough, chest tightness and shortness of breath.   Cardiovascular: Negative  for leg swelling.  Neurological: Positive for weakness and light-headedness. Negative for syncope.  All other systems reviewed and are negative.    Allergies  Review of patient's allergies indicates no known allergies.  Home Medications   Current Outpatient Rx  Name Route Sig Dispense Refill  . ALBUTEROL SULFATE HFA 108 (90 BASE) MCG/ACT IN AERS Inhalation Inhale 2 puffs into the lungs every 6 (six) hours as needed.    Marland Kitchen BYSTOLIC 5 MG PO TABS  1 by mouth daily    . CITALOPRAM HYDROBROMIDE 40 MG PO TABS Oral Take 40 mg by mouth daily. 1 by mouth daily    . CYCLOBENZAPRINE HCL 10 MG PO TABS Oral Take 10 mg by mouth at bedtime as needed. For spasms    . ESOMEPRAZOLE MAGNESIUM 40 MG PO CPDR Oral Take 40 mg by mouth 2 (two) times daily.    Marland Kitchen FLUTICASONE PROPIONATE 50 MCG/ACT NA SUSP Nasal Place 2 sprays into the nose 2 (two) times daily. 16 g 12  . GABAPENTIN 300 MG PO CAPS Oral Take 300 mg by mouth 3 (three) times daily.    Marland Kitchen LORATADINE 10 MG PO TABS Oral Take 1 tablet (10 mg total) by mouth daily. 30 tablet 4  . OXYCODONE-ACETAMINOPHEN 5-325 MG  PO TABS Oral Take 1 tablet by mouth daily as needed. For pain    . EPIDRIN 325-65-100 MG PO CAPS Oral Take 1-2 capsules by mouth 2 (two) times daily as needed. For migraines    . HYDROCOD POLST-CPM POLST ER 10-8 MG/5ML PO LQCR Oral Take 5 mLs by mouth every 12 (twelve) hours as needed (cough). 140 mL 0  . PREDNISONE 50 MG PO TABS Oral Take 1 tablet (50 mg total) by mouth daily. 5 tablet 0    BP 131/81  Pulse 110  Temp(Src) 98.1 F (36.7 C) (Oral)  Resp 22  SpO2 98%  Physical Exam  Nursing note and vitals reviewed. Constitutional: She is oriented to person, place, and time. She appears well-developed and well-nourished. No distress.  HENT:  Head: Normocephalic and atraumatic.  Eyes: Pupils are equal, round, and reactive to light.  Neck: Normal range of motion. Neck supple.  Cardiovascular: Normal rate and regular rhythm.     Pulmonary/Chest: No accessory muscle usage. Tachypnea noted. No respiratory distress. She has no decreased breath sounds. She has wheezes.  Abdominal: Soft. She exhibits no distension. There is no tenderness.  Neurological: She is alert and oriented to person, place, and time.  Skin: Skin is warm and dry. No rash noted. She is not diaphoretic.  Psychiatric: She has a normal mood and affect.    ED Course  Procedures (including critical care time)  Labs Reviewed  CBC - Abnormal; Notable for the following:    Platelets 444 (*)    All other components within normal limits  BASIC METABOLIC PANEL - Abnormal; Notable for the following:    Potassium 3.2 (*)    All other components within normal limits  DIFFERENTIAL   Dg Chest 2 View  10/07/2011  *RADIOLOGY REPORT*  Clinical Data: Cough.  Congestion.  CHEST - 2 VIEW  Comparison: 02/03/2010.  Findings: Cardiopericardial silhouette is probably within normal limits.  There is there is severe dextroconvex thoracic scoliosis.  There is dense consolidation of the left lower lobe on both frontal and lateral views, consistent with pneumonia or aspiration pneumonitis.  This is new compared to the prior exam of 02/03/2010. No effusion is identified.  Follow-up to ensure radiographic clearing recommended.  Clearing is usually observed at 4-6 weeks.  IMPRESSION:  1.  Left lower lobe pneumonia. 2.  Severe dextroconvex thoracic scoliosis.  Original Report Authenticated By: Andreas Newport, M.D.   I reviewed the above myself.    1. Dyspnea   2. Community acquired pneumonia     12:06 PM Will get second neb, try to ambulate afterwards on RA. Will give IV steroids and IV avelox here.       ECG at time 14:12 shows sinus tachy of 101, normal axis, rsr` in lead V1 and V2, non specific anterior t wave abn, similar and slightly improved compared to ECG from 08/09/99.    2:25 PM With ambulation, pt's HR up to 140, RR up to 35, sats ok at 96%.  I spoke to  pulmonologist who will see pt in the ED.  He doesn't think pt needs admission for this but will evaluate and discuss with pt and family.  Pt is tachypeic now, will add some supplemental O2.  ECG shows no new ischemia, no arrythmia  MDM  Pt with low end but normal saturations from 99-93% on RA.  Pt with hoarse voice, but not hypotensive, no N/V, is able to eat and drink, take meds.  Will give nebs, check labs,  IV steroids and discuss with Dr. Delton Coombes.          Gavin Pound. Mckoy Bhakta, MD 10/09/11 1427

## 2011-10-09 NOTE — ED Notes (Signed)
Pt stayed at about 96% while walking but res went up to 33, and hr went up to 140.

## 2011-10-10 DIAGNOSIS — R55 Syncope and collapse: Secondary | ICD-10-CM

## 2011-10-10 MED ORDER — SALINE SPRAY 0.65 % NA SOLN: 1.0000 | NASAL | Status: DC | PRN

## 2011-10-10 MED ORDER — MONTELUKAST SODIUM 10 MG PO TABS
10.0000 mg | ORAL_TABLET | Freq: Every day | ORAL | Status: DC
  Administered 2011-10-10 – 2011-10-13 (×4): 10 mg via ORAL
  Filled 2011-10-10 (×5): qty 1

## 2011-10-10 MED ORDER — SALINE SPRAY 0.65 % NA SOLN
2.0000 | Freq: Three times a day (TID) | NASAL | Status: DC
  Administered 2011-10-10 – 2011-10-14 (×12): 2 via NASAL
  Filled 2011-10-10: qty 44

## 2011-10-10 NOTE — Progress Notes (Signed)
*  PRELIMINARY RESULTS* Vascular Ultrasound Carotid Duplex (Doppler) has been completed.  No evidence of internal carotid artery stenosis bilaterally. Bilateral antegrade vertebral artery flow.  10/10/2011 3:53 PM Elpidio Galea, RDMS, RDCS
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
## 2011-10-10 NOTE — Progress Notes (Signed)
Utilization review completed.  

## 2011-10-11 ENCOUNTER — Telehealth: Payer: Self-pay | Admitting: Emergency Medicine

## 2011-10-11 LAB — EXPECTORATED SPUTUM ASSESSMENT W GRAM STAIN, RFLX TO RESP C

## 2011-10-11 NOTE — Telephone Encounter (Signed)
Spoke with pt's daughter Marcelino Duster. She states that she is calling again for results of doppler and "all other tests" that we ordered. I advised that she needs to speak with the hospitalist about this, and get results from them. She states that she has asked already about this, and the nurses there tell her to call Dr Delton Coombes for these results. I advised that he is off, but the doc that is at [facility] should be able to take care of this for her. I paged MW since he is at [facility] and gave him the heads up. She is aware and states nothing further needed.

## 2011-10-11 NOTE — Telephone Encounter (Signed)
I spoke with Mrs. Henry Russel and she was wanting to know when the doctor was going to round on pt over in the hospital today to give pt her doppler results. I advised her she will need to speak with pt's nurse over in the hospital. She states she did and was told they were not sure. She stated she will try calling the hospital back again later to see if the doctor has rounder on her yet or not. Nothing further was needed
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
## 2011-10-12 ENCOUNTER — Inpatient Hospital Stay (HOSPITAL_COMMUNITY): Payer: Medicare Other

## 2011-10-12 LAB — BASIC METABOLIC PANEL
BUN: 12 mg/dL (ref 6–23)
CO2: 29 mEq/L (ref 19–32)
Calcium: 9.1 mg/dL (ref 8.4–10.5)
Glucose, Bld: 94 mg/dL (ref 70–99)
Sodium: 136 mEq/L (ref 135–145)

## 2011-10-12 LAB — CBC
MCH: 30.9 pg (ref 26.0–34.0)
MCV: 91.7 fL (ref 78.0–100.0)
Platelets: 383 10*3/uL (ref 150–400)
RBC: 3.98 MIL/uL (ref 3.87–5.11)
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
## 2011-10-13 MED ORDER — MOXIFLOXACIN HCL 400 MG PO TABS
400.0000 mg | ORAL_TABLET | Freq: Every day | ORAL | Status: DC
  Administered 2011-10-13 – 2011-10-14 (×2): 400 mg via ORAL
  Filled 2011-10-13 (×2): qty 1
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
## 2011-10-13 NOTE — Consult Note (Signed)
ENT CONSULT:  Reason for Consult:Chronic cough, Hoarseness, Sinusitis Referring Physician: [name] is an 61 y.o. female.  HPI: 5 month hx of cough with syncope. Aggressive tx for Inf., Pulm. isssues and GER. ? Pneumonia vs sinusitis. No sinus sx's x PND and cough. Current adm for LLL consolidation and cough.  Past Medical History  Diagnosis Date  . Hypertension   . Cough   . Depression   . COPD (chronic obstructive pulmonary disease)   . RLS (restless legs syndrome)   . Chronic low back pain   . Migraine   . Palpitations   . Sjogrens syndrome   . Anxiety     Past Surgical History  Procedure Date  . Back surgery     07/1998 and 03/1999  . Tubal ligation     Family History  Problem Relation Age of Onset  . Arthritis Mother     Social History:  reports that she has never smoked. She has never used smokeless tobacco. She reports that she drinks alcohol. She reports that she does not use illicit drugs.  Allergies: No Known Allergies  Medications: I have reviewed the patient's current medications.  Results for orders placed during the hospital encounter of 10/09/11 (from the past 48 hour(s))  BASIC METABOLIC PANEL     Status: Normal   Collection Time   10/12/11  5:05 AM      Component Value Range Comment   Sodium 136  135 - 145 (mEq/L)    Potassium 4.3  3.5 - 5.1 (mEq/L)    Chloride 99  96 - 112 (mEq/L)    CO2 29  19 - 32 (mEq/L)    Glucose, Bld 94  70 - 99 (mg/dL)    BUN 12  6 - 23 (mg/dL)    Creatinine, Ser 1.61  0.50 - 1.10 (mg/dL)    Calcium 9.1  8.4 - 10.5 (mg/dL)    GFR calc non Af Amer >90  >90 (mL/min)    GFR calc Af Amer >90  >90 (mL/min)   CBC     Status: Normal   Collection Time   10/12/11  5:05 AM      Component Value Range Comment   WBC 6.7  4.0 - 10.5 (K/uL)    RBC 3.98  3.87 - 5.11 (MIL/uL)    Hemoglobin 12.3  12.0 - 15.0 (g/dL)    HCT 09.6  04.5 - 40.9 (%)    MCV 91.7  78.0 - 100.0 (fL)    MCH 30.9  26.0 - 34.0 (pg)    MCHC 33.7   30.0 - 36.0 (g/dL)    RDW 81.1  91.4 - 78.2 (%)    Platelets 383  150 - 400 (K/uL)   MAGNESIUM     Status: Normal   Collection Time   10/12/11  5:05 AM      Component Value Range Comment   Magnesium 2.3  1.5 - 2.5 (mg/dL)     Dg Chest 2 View  9/56/2130  *RADIOLOGY REPORT*  Clinical Data: Congestion and atelectasis.  CHEST - 2 VIEW  Comparison: CT scan from 10/09/2011.  Chest x-ray from 10/07/2011.  Findings: Compared the previous chest x-ray, aeration at the left base has improved in the interval.  No edema or focal airspace consolidation on today's exam. Interstitial markings are diffusely coarsened with chronic features. The cardiopericardial silhouette is within normal limits for size. There is some residual peripheral patchy airspace disease on the left, associated with nonacute left-sided  rib fractures.  Prominent convex rightward thoracic scoliosis noted.  IMPRESSION: Improved lung aeration at the left base with some residual probable atelectasis as seen on recent CT scan.  Original Report Authenticated By: ERIC A. MANSELL, M.D.    ROS:@ROS @  Blood pressure 104/69, pulse 67, temperature 97.8 F (36.6 C), temperature source Oral, resp. rate 20, height 5\' 4"  (1.626 m), weight 69.4 kg (153 lb), SpO2 96.00%.  PHYSICAL EXAM: Nose - Left septal dev, no d/c, polyp or infection Mouth - mucous membranes moist, pharynx normal without lesions and No PND Neck - supple, no significant adenopathy  Flex Laryngoscopy: Nl VC's, no lesion or polyp. Mod secretions  Studies Reviewed:CT sinus 5/19 shows muc thickening in Rt sphenoid and Lt Frontal, No AFL or active appearing inf.  Assessment/Plan: Doubt active sinus inf as source of cough and acute sx. Larynx nl, VC's nl. Cont ABx and medical tx. Plan f/u as an Outpt with post tx CT scan of sinuses. Schedule f/u in ~3 wks for recheck.  Toni Myers 10/13/2011, 10:21 AM

## 2011-10-14 ENCOUNTER — Ambulatory Visit: Payer: Medicare Other | Admitting: Emergency Medicine

## 2011-10-14 MED ORDER — MOXIFLOXACIN HCL 400 MG PO TABS: 400.0000 mg | ORAL_TABLET | Freq: Every day | ORAL | Status: AC

## 2011-10-14 MED ORDER — HYDROCOD POLST-CHLORPHEN POLST 10-8 MG/5ML PO LQCR: 5.0000 mL | Freq: Two times a day (BID) | ORAL | Status: DC | PRN

## 2011-10-14 MED ORDER — MONTELUKAST SODIUM 10 MG PO TABS: 10.0000 mg | ORAL_TABLET | Freq: Every day | ORAL | Status: DC
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
## 2011-11-08 ENCOUNTER — Inpatient Hospital Stay: Payer: Medicare Other | Admitting: Adult Health

## 2011-11-15 ENCOUNTER — Ambulatory Visit (INDEPENDENT_AMBULATORY_CARE_PROVIDER_SITE_OTHER)
Admission: RE | Admit: 2011-11-15 | Discharge: 2011-11-15 | Disposition: A | Discharge status: Home / Self Care | Payer: Medicare Other | Source: Ambulatory Visit | Attending: Adult Health | Admitting: Adult Health

## 2011-11-15 ENCOUNTER — Ambulatory Visit (INDEPENDENT_AMBULATORY_CARE_PROVIDER_SITE_OTHER): Payer: Medicare Other | Admitting: Adult Health

## 2011-11-15 ENCOUNTER — Encounter: Payer: Self-pay | Admitting: Adult Health

## 2011-11-15 VITALS — BP 108/68 | HR 68 | Temp 99.1°F | Ht 64.0 in | Wt 150.8 lb

## 2011-11-15 DIAGNOSIS — J449 Chronic obstructive pulmonary disease, unspecified: Secondary | ICD-10-CM

## 2011-11-15 DIAGNOSIS — R05 Cough: Secondary | ICD-10-CM

## 2011-11-15 MED ORDER — HYDROCODONE-HOMATROPINE 5-1.5 MG/5ML PO SYRP: 5.0000 mL | ORAL_SOLUTION | Freq: Four times a day (QID) | ORAL | Status: AC | PRN
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
## 2011-11-15 NOTE — Patient Instructions (Addendum)
Begin Chlorphenaramine 4mg  in am and 2 At bedtime   Delsym 2 tsp Twice daily   Add Pepcid 20mg  At bedtime   GERD Diet  Try to use sips of water and sugarless candy to avoid cough  Avoid MINT products.  Hydromet 1-2 tsp every 4 hr as needed for cough - may make you sleepy.  GOAL IS TO NOT COUGH OR CLEAR THROAT- TRY TO GET AHEAD OF COUGH  follow up Dr. Delton Coombes  In 4 weeks with PFT -lung fxn test .  Please contact office for sooner follow up if symptoms do not improve or worsen or seek emergency care

## 2011-11-15 NOTE — Assessment & Plan Note (Signed)
Upper airway cough syndrome  CT Chest w/ scattered ?atx tx as if possible bronchitis  CT sinus w/out acute process.  Laryngoscopy per ENT  w/ no acute process  Will have her return for PFT  Tx for cyclical cough w/ prevention for Cough/LPR/PND  Follow up cxr for clearance of atx.  follow up Dr. Delton Coombes  In 4 weeks and As needed

## 2011-11-16 NOTE — Progress Notes (Signed)
Quick Note:  Spoke with pt and notified of results. Pt verbalized understanding. ______ 

## 2011-11-28 ENCOUNTER — Ambulatory Visit: Payer: Medicare Other | Admitting: Cardiovascular Disease

## 2012-01-09 ENCOUNTER — Ambulatory Visit: Payer: Medicare Other | Admitting: Cardiovascular Disease

## 2012-01-24 ENCOUNTER — Ambulatory Visit: Payer: Medicare Other | Admitting: Cardiovascular Disease

## 2012-05-30 ENCOUNTER — Encounter: Payer: Self-pay | Admitting: Emergency Medicine

## 2012-05-30 ENCOUNTER — Ambulatory Visit (INDEPENDENT_AMBULATORY_CARE_PROVIDER_SITE_OTHER): Payer: Medicare Other | Admitting: Emergency Medicine

## 2012-05-30 VITALS — BP 124/92 | HR 77 | Temp 98.7°F | Ht 64.0 in | Wt 143.6 lb

## 2012-05-30 DIAGNOSIS — R05 Cough: Secondary | ICD-10-CM

## 2012-05-30 NOTE — Assessment & Plan Note (Addendum)
Continues to have daily cough although fairly well controlled right now.  - continue current nasal regimen - continue nexium bid - get copy of PFT from [place] - no addition of BD's right now - rov in April

## 2012-05-30 NOTE — Patient Instructions (Addendum)
Please continue your medications as you are taking them We will get a copy of your PFT from October Follow with Dr Delton Coombes in April or sooner if you have any problems.
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
## 2012-07-17 ENCOUNTER — Ambulatory Visit: Payer: Self-pay | Admitting: Obstetrics and Gynecology

## 2012-07-18 ENCOUNTER — Ambulatory Visit: Payer: Medicare Other | Admitting: Obstetrics and Gynecology

## 2012-07-18 ENCOUNTER — Encounter: Payer: Self-pay | Admitting: Obstetrics and Gynecology

## 2012-07-18 VITALS — BP 100/64 | Ht 64.0 in | Wt 142.0 lb

## 2012-07-18 DIAGNOSIS — N9089 Other specified noninflammatory disorders of vulva and perineum: Secondary | ICD-10-CM

## 2012-07-18 LAB — POCT WET PREP (WET MOUNT)
Clue Cells Wet Prep Whiff POC: NEGATIVE
Trichomonas Wet Prep HPF POC: NEGATIVE
WBC, Wet Prep HPF POC: NEGATIVE
pH: 4.5

## 2012-07-18 NOTE — Progress Notes (Signed)
Subjective:    Toni Myers is a 62 y.o. female, G2P2000, who presents for vaginal itching x 1 month with no discharge. Pt used Neosporin around the area and it helped some but not much. Pt has been on antibiotics numerous times along with steroids. Itching is localized on the left with no discharge.   The following portions of the patient's history were reviewed and updated as appropriate: allergies, current medications, past family history.  Review of Systems Pertinent items are noted in HPI. Urinary:negative   Objective:    BP 100/64  Ht 5\' 4"  (1.626 m)  Wt 142 lb (64.411 kg)  BMI 24.36 kg/m2    Weight:  Wt Readings from Last 1 Encounters:  07/18/12 142 lb (64.411 kg)          BMI: Body mass index is 24.36 kg/(m^2).  General Appearance: Alert, appropriate appearance for age. No acute distress GYN exam: leukoplakia left  clitoridal  Hood with small ulceration  Wet prep:negative   Assessment:    vulvar leukoplakia    Plan:    Schedule vulvar biopsy Daily Probiotic Recommended Diflucan when taking antibiotic  Leukoplakia Discussed   Silverio Lay MD

## 2012-07-18 NOTE — Addendum Note (Signed)
Addended by: Darien Ramus on: 07/18/2012 01:07 PM   Modules accepted: Orders

## 2012-07-27 ENCOUNTER — Encounter: Payer: Medicare Other | Admitting: Obstetrics and Gynecology

## 2012-08-23 ENCOUNTER — Telehealth: Payer: Self-pay | Admitting: Emergency Medicine

## 2012-08-23 DIAGNOSIS — R05 Cough: Secondary | ICD-10-CM

## 2012-08-23 MED ORDER — LORATADINE 10 MG PO TABS: 10.0000 mg | ORAL_TABLET | Freq: Every day | ORAL | Status: DC

## 2012-08-23 NOTE — Telephone Encounter (Signed)
Let's try stopping singulair, continue loratadine. Have her call if not working.

## 2012-08-23 NOTE — Telephone Encounter (Signed)
I spoke with pt and she stated both Claritin and Singulair is on her list. She only wants a refill on one. She wants RB to decide which one is better for her and which one he prefers her to take. Please advise thanks

## 2012-08-23 NOTE — Telephone Encounter (Signed)
lmomtcb x1 for pt--rx has been sent for the loratadine

## 2012-08-24 NOTE — Telephone Encounter (Signed)
Spoke with patient, pt aware rx has been sent. Nothing further needed at this time.

## 2012-08-29 ENCOUNTER — Other Ambulatory Visit: Payer: Self-pay

## 2012-08-29 DIAGNOSIS — Z1231 Encounter for screening mammogram for malignant neoplasm of breast: Secondary | ICD-10-CM

## 2012-09-06 ENCOUNTER — Encounter: Payer: Self-pay | Admitting: Emergency Medicine

## 2012-09-06 ENCOUNTER — Ambulatory Visit (INDEPENDENT_AMBULATORY_CARE_PROVIDER_SITE_OTHER): Payer: Medicare Other | Admitting: Emergency Medicine

## 2012-09-06 VITALS — BP 120/76 | HR 63 | Temp 97.5°F | Ht 64.0 in | Wt 138.8 lb

## 2012-09-06 DIAGNOSIS — J449 Chronic obstructive pulmonary disease, unspecified: Secondary | ICD-10-CM

## 2012-09-06 DIAGNOSIS — R05 Cough: Secondary | ICD-10-CM

## 2012-09-06 MED ORDER — MONTELUKAST SODIUM 10 MG PO TABS: 10.0000 mg | ORAL_TABLET | Freq: Every day | ORAL | Status: DC

## 2012-09-06 NOTE — Assessment & Plan Note (Signed)
Add back singulair to loratadine continue nexium bid rov 3 mon

## 2012-09-06 NOTE — Patient Instructions (Addendum)
Please restart singulair every evening Continue your nexium, loratadine, symbicort Use Proventil 2 puffs as needed Follow with Dr Delton Coombes in 3 months or sooner if you have any problems.

## 2012-09-06 NOTE — Assessment & Plan Note (Signed)
symbicort 160 bid + SABA prn
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
## 2012-09-13 ENCOUNTER — Ambulatory Visit: Payer: Medicare Other | Admitting: Emergency Medicine

## 2012-10-03 ENCOUNTER — Ambulatory Visit: Payer: Medicare Other

## 2012-10-12 ENCOUNTER — Ambulatory Visit: Payer: Medicare Other

## 2012-11-14 ENCOUNTER — Ambulatory Visit: Payer: Medicare Other

## 2012-11-28 ENCOUNTER — Ambulatory Visit: Payer: Medicare Other

## 2012-12-07 ENCOUNTER — Ambulatory Visit: Payer: Medicare Other | Admitting: Emergency Medicine

## 2013-01-09 ENCOUNTER — Ambulatory Visit: Payer: Medicare Other | Admitting: Emergency Medicine

## 2013-01-11 ENCOUNTER — Encounter: Payer: Self-pay | Admitting: Emergency Medicine

## 2013-02-13 ENCOUNTER — Ambulatory Visit: Payer: Medicare Other | Admitting: Emergency Medicine

## 2013-03-26 ENCOUNTER — Ambulatory Visit: Payer: Medicare Other | Admitting: Emergency Medicine

## 2013-04-24 DIAGNOSIS — H43812 Vitreous degeneration, left eye: Secondary | ICD-10-CM | POA: Insufficient documentation

## 2013-04-24 DIAGNOSIS — H53009 Unspecified amblyopia, unspecified eye: Secondary | ICD-10-CM | POA: Insufficient documentation

## 2013-04-24 DIAGNOSIS — IMO0002 Reserved for concepts with insufficient information to code with codable children: Secondary | ICD-10-CM | POA: Insufficient documentation

## 2013-04-24 DIAGNOSIS — H5210 Myopia, unspecified eye: Secondary | ICD-10-CM | POA: Insufficient documentation

## 2013-04-30 ENCOUNTER — Ambulatory Visit: Payer: Medicare Other | Admitting: Emergency Medicine

## 2013-06-25 ENCOUNTER — Other Ambulatory Visit: Payer: Self-pay | Admitting: Neurosurgery

## 2013-06-25 DIAGNOSIS — M545 Low back pain, unspecified: Secondary | ICD-10-CM

## 2013-06-28 ENCOUNTER — Ambulatory Visit
Admission: RE | Admit: 2013-06-28 | Discharge: 2013-06-28 | Disposition: A | Discharge status: Home / Self Care | Payer: Medicare Other | Source: Ambulatory Visit | Attending: Neurosurgery | Admitting: Neurosurgery

## 2013-06-28 DIAGNOSIS — M545 Low back pain, unspecified: Secondary | ICD-10-CM

## 2013-07-19 ENCOUNTER — Observation Stay (HOSPITAL_COMMUNITY)
Admission: EM | Admit: 2013-07-19 | Discharge: 2013-07-23 | Disposition: A | Discharge status: Skilled Nursing Facility | Payer: Medicare Other | Attending: Internal Medicine | Admitting: Internal Medicine

## 2013-07-19 ENCOUNTER — Emergency Department (HOSPITAL_COMMUNITY): Payer: Medicare Other

## 2013-07-19 ENCOUNTER — Encounter (HOSPITAL_COMMUNITY): Payer: Self-pay | Admitting: Emergency Medicine

## 2013-07-19 ENCOUNTER — Other Ambulatory Visit: Payer: Self-pay | Admitting: Neurosurgery

## 2013-07-19 DIAGNOSIS — M3500 Sicca syndrome, unspecified: Secondary | ICD-10-CM | POA: Diagnosis not present

## 2013-07-19 DIAGNOSIS — S6390XA Sprain of unspecified part of unspecified wrist and hand, initial encounter: Secondary | ICD-10-CM | POA: Insufficient documentation

## 2013-07-19 DIAGNOSIS — M25551 Pain in right hip: Secondary | ICD-10-CM

## 2013-07-19 DIAGNOSIS — H209 Unspecified iridocyclitis: Secondary | ICD-10-CM

## 2013-07-19 DIAGNOSIS — F0390 Unspecified dementia without behavioral disturbance: Secondary | ICD-10-CM | POA: Insufficient documentation

## 2013-07-19 DIAGNOSIS — G2581 Restless legs syndrome: Secondary | ICD-10-CM | POA: Diagnosis not present

## 2013-07-19 DIAGNOSIS — W010XXA Fall on same level from slipping, tripping and stumbling without subsequent striking against object, initial encounter: Secondary | ICD-10-CM | POA: Insufficient documentation

## 2013-07-19 DIAGNOSIS — S32509A Unspecified fracture of unspecified pubis, initial encounter for closed fracture: Principal | ICD-10-CM | POA: Insufficient documentation

## 2013-07-19 DIAGNOSIS — M25539 Pain in unspecified wrist: Secondary | ICD-10-CM | POA: Insufficient documentation

## 2013-07-19 DIAGNOSIS — F3289 Other specified depressive episodes: Secondary | ICD-10-CM | POA: Insufficient documentation

## 2013-07-19 DIAGNOSIS — E876 Hypokalemia: Secondary | ICD-10-CM | POA: Insufficient documentation

## 2013-07-19 DIAGNOSIS — Y92009 Unspecified place in unspecified non-institutional (private) residence as the place of occurrence of the external cause: Secondary | ICD-10-CM | POA: Insufficient documentation

## 2013-07-19 DIAGNOSIS — R059 Cough, unspecified: Secondary | ICD-10-CM

## 2013-07-19 DIAGNOSIS — M545 Low back pain, unspecified: Secondary | ICD-10-CM | POA: Insufficient documentation

## 2013-07-19 DIAGNOSIS — R918 Other nonspecific abnormal finding of lung field: Secondary | ICD-10-CM

## 2013-07-19 DIAGNOSIS — F329 Major depressive disorder, single episode, unspecified: Secondary | ICD-10-CM | POA: Diagnosis not present

## 2013-07-19 DIAGNOSIS — R05 Cough: Secondary | ICD-10-CM

## 2013-07-19 DIAGNOSIS — Z79899 Other long term (current) drug therapy: Secondary | ICD-10-CM | POA: Diagnosis not present

## 2013-07-19 DIAGNOSIS — S32599A Other specified fracture of unspecified pubis, initial encounter for closed fracture: Secondary | ICD-10-CM

## 2013-07-19 DIAGNOSIS — IMO0001 Reserved for inherently not codable concepts without codable children: Secondary | ICD-10-CM

## 2013-07-19 DIAGNOSIS — IMO0002 Reserved for concepts with insufficient information to code with codable children: Secondary | ICD-10-CM

## 2013-07-19 DIAGNOSIS — M069 Rheumatoid arthritis, unspecified: Secondary | ICD-10-CM

## 2013-07-19 DIAGNOSIS — I10 Essential (primary) hypertension: Secondary | ICD-10-CM

## 2013-07-19 DIAGNOSIS — J449 Chronic obstructive pulmonary disease, unspecified: Secondary | ICD-10-CM

## 2013-07-19 DIAGNOSIS — R55 Syncope and collapse: Secondary | ICD-10-CM

## 2013-07-19 DIAGNOSIS — F32A Depression, unspecified: Secondary | ICD-10-CM

## 2013-07-19 DIAGNOSIS — J441 Chronic obstructive pulmonary disease with (acute) exacerbation: Secondary | ICD-10-CM

## 2013-07-19 DIAGNOSIS — R002 Palpitations: Secondary | ICD-10-CM

## 2013-07-19 HISTORY — DX: Other specified fracture of unspecified pubis, initial encounter for closed fracture: S32.599A

## 2013-07-19 HISTORY — DX: Reserved for inherently not codable concepts without codable children: IMO0001

## 2013-07-19 LAB — TYPE AND SCREEN
ABO/RH(D): A POS
Antibody Screen: NEGATIVE

## 2013-07-19 LAB — CBC WITH DIFFERENTIAL/PLATELET
BASOS PCT: 0 % (ref 0–1)
Basophils Absolute: 0 10*3/uL (ref 0.0–0.1)
EOS ABS: 0.1 10*3/uL (ref 0.0–0.7)
EOS PCT: 1 % (ref 0–5)
HCT: 35.2 % — ABNORMAL LOW (ref 36.0–46.0)
HEMOGLOBIN: 12.1 g/dL (ref 12.0–15.0)
Lymphocytes Relative: 10 % — ABNORMAL LOW (ref 12–46)
Lymphs Abs: 0.9 10*3/uL (ref 0.7–4.0)
MCH: 32 pg (ref 26.0–34.0)
MCHC: 34.4 g/dL (ref 30.0–36.0)
MCV: 93.1 fL (ref 78.0–100.0)
MONOS PCT: 5 % (ref 3–12)
Monocytes Absolute: 0.4 10*3/uL (ref 0.1–1.0)
NEUTROS PCT: 84 % — AB (ref 43–77)
Neutro Abs: 7 10*3/uL (ref 1.7–7.7)
PLATELETS: 258 10*3/uL (ref 150–400)
RBC: 3.78 MIL/uL — ABNORMAL LOW (ref 3.87–5.11)
RDW: 14.6 % (ref 11.5–15.5)
WBC: 8.4 10*3/uL (ref 4.0–10.5)

## 2013-07-19 LAB — BASIC METABOLIC PANEL
BUN: 14 mg/dL (ref 6–23)
CALCIUM: 9.5 mg/dL (ref 8.4–10.5)
CO2: 26 meq/L (ref 19–32)
Chloride: 98 mEq/L (ref 96–112)
Creatinine, Ser: 0.66 mg/dL (ref 0.50–1.10)
GFR calc Af Amer: >90 mL/min (ref >90)
GLUCOSE: 120 mg/dL — AB (ref 70–99)
Potassium: 3.6 mEq/L — ABNORMAL LOW (ref 3.7–5.3)
Sodium: 137 mEq/L (ref 137–147)

## 2013-07-19 LAB — PROTIME-INR
INR: 0.99 (ref 0.00–1.49)
Prothrombin Time: 12.9 seconds (ref 11.6–15.2)

## 2013-07-19 MED ORDER — DOCUSATE SODIUM 100 MG PO CAPS: 100.0000 mg | ORAL_CAPSULE | Freq: Every day | ORAL | Status: DC | PRN

## 2013-07-19 MED ORDER — ACETAMINOPHEN 650 MG RE SUPP: 650.0000 mg | Freq: Four times a day (QID) | RECTAL | Status: DC | PRN

## 2013-07-19 MED ORDER — ONDANSETRON HCL 4 MG/2ML IJ SOLN: 4.0000 mg | Freq: Four times a day (QID) | INTRAMUSCULAR | Status: DC | PRN

## 2013-07-19 MED ORDER — ACETAMINOPHEN 325 MG PO TABS: 650.0000 mg | ORAL_TABLET | Freq: Four times a day (QID) | ORAL | Status: DC | PRN

## 2013-07-19 MED ORDER — ONDANSETRON HCL 4 MG/2ML IJ SOLN
4.0000 mg | Freq: Once | INTRAMUSCULAR | Status: AC
  Administered 2013-07-19: 4 mg via INTRAVENOUS
  Filled 2013-07-19: qty 2

## 2013-07-19 MED ORDER — ONDANSETRON HCL 4 MG PO TABS: 4.0000 mg | ORAL_TABLET | Freq: Four times a day (QID) | ORAL | Status: DC | PRN

## 2013-07-19 MED ORDER — FENTANYL CITRATE 0.05 MG/ML IJ SOLN
50.0000 ug | INTRAMUSCULAR | Status: DC | PRN
  Administered 2013-07-19: 50 ug via INTRAVENOUS
  Filled 2013-07-19: qty 2

## 2013-07-19 MED ORDER — FENTANYL CITRATE 0.05 MG/ML IJ SOLN: 12.5000 ug | INTRAMUSCULAR | Status: DC | PRN

## 2013-07-19 MED ORDER — HYDROMORPHONE HCL PF 2 MG/ML IJ SOLN: 2.0000 mg | INTRAMUSCULAR | Status: DC | PRN

## 2013-07-19 MED ORDER — POTASSIUM CHLORIDE CRYS ER 20 MEQ PO TBCR
40.0000 meq | EXTENDED_RELEASE_TABLET | Freq: Once | ORAL | Status: AC
  Administered 2013-07-20: 40 meq via ORAL
  Filled 2013-07-19: qty 2

## 2013-07-19 MED ORDER — METHOCARBAMOL 500 MG PO TABS
500.0000 mg | ORAL_TABLET | Freq: Four times a day (QID) | ORAL | Status: DC | PRN
  Administered 2013-07-20 – 2013-07-23 (×6): 500 mg via ORAL
  Filled 2013-07-19 (×5): qty 1

## 2013-07-19 MED ORDER — KETOROLAC TROMETHAMINE 15 MG/ML IJ SOLN: 15.0000 mg | Freq: Four times a day (QID) | INTRAMUSCULAR | Status: DC | PRN

## 2013-07-19 MED ORDER — SODIUM CHLORIDE 0.9 % IV SOLN
1000.0000 mL | INTRAVENOUS | Status: DC
  Administered 2013-07-19 – 2013-07-20 (×2): 1000 mL via INTRAVENOUS

## 2013-07-19 MED ORDER — ENOXAPARIN SODIUM 40 MG/0.4ML ~~LOC~~ SOLN
40.0000 mg | SUBCUTANEOUS | Status: DC
  Administered 2013-07-20 – 2013-07-23 (×4): 40 mg via SUBCUTANEOUS
  Filled 2013-07-19 (×4): qty 0.4

## 2013-07-19 NOTE — ED Notes (Signed)
Bed: [facility] Expected date:  Expected time:  Means of arrival:  Comments: ems- hip pain, immobilized

## 2013-07-19 NOTE — ED Notes (Signed)
Per EMS, Pt tripped over grandson and fell on her R side. No LOC, no neck pain. Pt c/o back pain but from previous back surgery. C/o pain in R hip, pain 10/10 to palpation and movement. burning sensation with sharp pulses shooting down leg. A&Ox4. NAD noted.

## 2013-07-19 NOTE — ED Provider Notes (Signed)
CSN: [id number]     Arrival date & time 07/19/13  1902 History   First MD Initiated Contact with Patient 07/19/13 1906     Chief Complaint  Patient presents with  . Hip Pain    HPI Patient presents to the emergency room with complaints of pain in her right hip.  Pt was walking and tripped over her grandson.  She landed on her right side.  She has severe pain in her right hip now and was unable to stand.  She also feels a burning sensation running down her leg.    Pt now has pain moving her right hip.  She also has difficulty moving her foot on the right side.  She has chronic back trouble and this does seem to be worse now.  No head injury.  No LOC.  NO neck pain. Past Medical History  Diagnosis Date  . Hypertension   . Cough   . Depression   . COPD (chronic obstructive pulmonary disease)   . RLS (restless legs syndrome)   . Chronic low back pain   . Migraine   . Palpitations   . Sjogrens syndrome   . Anxiety   . Shingles    Past Surgical History  Procedure Laterality Date  . Back surgery      07/1998 and 03/1999  . Tubal ligation     Family History  Problem Relation Age of Onset  . Arthritis Mother    History  Substance Use Topics  . Smoking status: Never Smoker   . Smokeless tobacco: Never Used  . Alcohol Use: Yes     Comment: 2 glasses monthly   OB History   Grav Para Term Preterm Abortions TAB SAB Ect Mult Living   2 2 2             Review of Systems  All other systems reviewed and are negative.      Allergies  Morphine and related  Home Medications   Current Outpatient Rx  Name  Route  Sig  Dispense  Refill  . albuterol (PROAIR HFA) 108 (90 BASE) MCG/ACT inhaler   Inhalation   Inhale 2 puffs into the lungs every 6 (six) hours as needed.         Marland Kitchen APAP-Isometheptene-Dichloral (EPIDRIN) 325-65-100 MG CAPS   Oral   Take 1-2 capsules by mouth 2 (two) times daily as needed. For migraines         . budesonide-formoterol (SYMBICORT) 160-4.5 MCG/ACT  inhaler   Inhalation   Inhale 2 puffs into the lungs 2 (two) times daily.         Marland Kitchen BYSTOLIC 5 MG tablet   Oral   Take 5 mg by mouth daily. 1 by mouth daily         . calcium citrate (CALCITRATE - DOSED IN MG ELEMENTAL CALCIUM) 950 MG tablet   Oral   Take 200 mg of elemental calcium by mouth daily.         . cyclobenzaprine (FLEXERIL) 10 MG tablet   Oral   Take 10 mg by mouth at bedtime as needed. For spasms         . donepezil (ARICEPT) 10 MG tablet   Oral   Take 10 mg by mouth at bedtime.         Marland Kitchen escitalopram (LEXAPRO) 5 MG tablet   Oral   Take 5 mg by mouth daily.         Marland Kitchen esomeprazole (NEXIUM) 40 MG capsule  Oral   Take 40 mg by mouth 2 (two) times daily.         Marland Kitchen FOLIC ACID PO   Oral   Take 1 tablet by mouth daily.         . methotrexate 25 MG/ML injection   Subcutaneous   Inject 50 mg into the skin once a week.          Marland Kitchen oxyCODONE-acetaminophen (PERCOCET/ROXICET) 5-325 MG per tablet   Oral   Take 1 tablet by mouth every 8 (eight) hours as needed for severe pain.          Marland Kitchen PRESCRIPTION MEDICATION   Both Eyes   Place 1 drop into both eyes 4 (four) times daily. Eye drop made from pt's own blood          BP 117/80  Pulse 77  Temp(Src) 98 F (36.7 C) (Oral)  Resp 17  SpO2 96% Physical Exam  Nursing note and vitals reviewed. Constitutional: She appears well-developed and well-nourished. No distress.  HENT:  Head: Normocephalic and atraumatic.  Right Ear: External ear normal.  Left Ear: External ear normal.  Eyes: Conjunctivae are normal. Right eye exhibits no discharge. Left eye exhibits no discharge. No scleral icterus.  Neck: Neck supple. No tracheal deviation present.  Cardiovascular: Normal rate, regular rhythm and intact distal pulses.   Pulmonary/Chest: Effort normal and breath sounds normal. No stridor. No respiratory distress. She has no wheezes. She has no rales.  Abdominal: Soft. Bowel sounds are normal. She exhibits  no distension. There is no tenderness. There is no rebound and no guarding.  Musculoskeletal: She exhibits no edema.       Right hip: She exhibits decreased range of motion, tenderness and bony tenderness. She exhibits no swelling.       Lumbar back: She exhibits tenderness and bony tenderness. She exhibits no swelling and no edema.       Right upper leg: She exhibits no bony tenderness and no swelling.  Pt is more comfortable with her right hip flexed  Neurological: She is alert. She has normal strength. No cranial nerve deficit (no facial droop, extraocular movements intact, no slurred speech) or sensory deficit. She exhibits normal muscle tone. She displays no seizure activity. Coordination normal.  Skin: Skin is warm and dry. No rash noted.  Psychiatric: She has a normal mood and affect.    ED Course  Procedures (including critical care time) Labs Review Labs Reviewed  BASIC METABOLIC PANEL - Abnormal; Notable for the following:    Potassium 3.6 (*)    Glucose, Bld 120 (*)    All other components within normal limits  CBC WITH DIFFERENTIAL - Abnormal; Notable for the following:    RBC 3.78 (*)    HCT 35.2 (*)    Neutrophils Relative % 84 (*)    Lymphocytes Relative 10 (*)    All other components within normal limits  PROTIME-INR  TYPE AND SCREEN   Imaging Review Dg Lumbar Spine 2-3 Views  07/19/2013   CLINICAL DATA:  Fall, lumbar pain  EXAM: LUMBAR SPINE - 2-3 VIEW  COMPARISON:  Lumbar spine CT dated 06/28/2013  FINDINGS: S-shaped thoracolumbar scoliosis.  Postsurgical changes at L5-S1.  No evidence of fracture or dislocation. Vertebral body heights are maintained.  IMPRESSION: No fracture or dislocation is seen.  Postsurgical changes at L5-S1.  S-shaped thoracolumbar scoliosis.   Electronically Signed   By: Julian Hy M.D.   On: 07/19/2013 20:22   Dg Pelvis 1-2  Views  07/19/2013   CLINICAL DATA:  Fall, right femur/lumbar pain, prior lumbar surgery  EXAM: PELVIS - 1-2 VIEW   COMPARISON:  None.  FINDINGS: Minimally displaced fracture involving the right superior pubic ramus.  Suspected nondisplaced fracture involving the right inferior pubic ramus.  Bilateral hip joint spaces are symmetric.  Postsurgical changes at L5-S1.  IMPRESSION: Right superior and inferior pubic rami fractures, as above.   Electronically Signed   By: Julian Hy M.D.   On: 07/19/2013 20:08   Dg Femur Right  07/19/2013   CLINICAL DATA:  Fall, right femur pain  EXAM: RIGHT FEMUR - 2 VIEW  COMPARISON:  None.  FINDINGS: Right pelvic ring fractures, as described on pelvic radiograph.  Right femur appears intact.  Right hip joint space is preserved.  IMPRESSION: No evidence of fracture or dislocation of the right femur.  Right pelvic ring fractures, as described on pelvic radiograph.   Electronically Signed   By: Julian Hy M.D.   On: 07/19/2013 20:09    D/w Dr Maureen Ralphs.  No operative intervention is required.  Weight bearing as tolerated.  MDM   Final diagnoses:  Closed fracture of multiple pubic rami    Patient was given pain medications IV. She is unable to walk still because of the severe pain. Patient may require rehabilitation. I will consult with the medical service for admission for IV pain management and physical therapy.     Kathalene Frames, MD 07/19/13 2117

## 2013-07-19 NOTE — ED Notes (Signed)
Called for report, RN unavailable.

## 2013-07-20 ENCOUNTER — Observation Stay (HOSPITAL_COMMUNITY): Payer: Medicare Other

## 2013-07-20 ENCOUNTER — Encounter (HOSPITAL_COMMUNITY): Payer: Self-pay | Admitting: *Deleted

## 2013-07-20 DIAGNOSIS — J449 Chronic obstructive pulmonary disease, unspecified: Secondary | ICD-10-CM

## 2013-07-20 LAB — ABO/RH: ABO/RH(D): A POS

## 2013-07-20 MED ORDER — ISOMETHEPTENE-APAP-DICHLORAL 65-325-100 MG PO CAPS: 1.0000 | ORAL_CAPSULE | Freq: Two times a day (BID) | ORAL | Status: DC | PRN

## 2013-07-20 MED ORDER — LIP MEDEX EX OINT
TOPICAL_OINTMENT | CUTANEOUS | Status: AC
  Administered 2013-07-21: 01:00:00
  Filled 2013-07-20: qty 7

## 2013-07-20 MED ORDER — ESCITALOPRAM OXALATE 5 MG PO TABS
5.0000 mg | ORAL_TABLET | Freq: Every day | ORAL | Status: DC
  Administered 2013-07-20 – 2013-07-23 (×4): 5 mg via ORAL
  Filled 2013-07-20 (×4): qty 1

## 2013-07-20 MED ORDER — DONEPEZIL HCL 10 MG PO TABS
10.0000 mg | ORAL_TABLET | Freq: Every day | ORAL | Status: DC
  Administered 2013-07-20 – 2013-07-22 (×4): 10 mg via ORAL
  Filled 2013-07-20 (×5): qty 1

## 2013-07-20 MED ORDER — FOLIC ACID 1 MG PO TABS
1.0000 mg | ORAL_TABLET | Freq: Every day | ORAL | Status: DC
  Administered 2013-07-20 – 2013-07-23 (×4): 1 mg via ORAL
  Filled 2013-07-20 (×4): qty 1

## 2013-07-20 MED ORDER — NON FORMULARY: 2.0000 [drp] | Freq: Two times a day (BID) | Status: DC

## 2013-07-20 MED ORDER — BUDESONIDE-FORMOTEROL FUMARATE 160-4.5 MCG/ACT IN AERO
2.0000 | INHALATION_SPRAY | Freq: Two times a day (BID) | RESPIRATORY_TRACT | Status: DC
  Filled 2013-07-20: qty 6

## 2013-07-20 MED ORDER — NEBIVOLOL HCL 5 MG PO TABS
5.0000 mg | ORAL_TABLET | Freq: Every day | ORAL | Status: DC
  Administered 2013-07-20 – 2013-07-23 (×4): 5 mg via ORAL
  Filled 2013-07-20 (×4): qty 1

## 2013-07-20 MED ORDER — NONFORMULARY OR COMPOUNDED ITEM
2.0000 [drp] | Freq: Two times a day (BID) | Status: DC
  Administered 2013-07-20: 1 [drp] via OPHTHALMIC
  Administered 2013-07-21: 2 [drp] via OPHTHALMIC
  Administered 2013-07-21: 1 [drp] via OPHTHALMIC

## 2013-07-20 MED ORDER — OXYCODONE-ACETAMINOPHEN 5-325 MG PO TABS
2.0000 | ORAL_TABLET | ORAL | Status: DC | PRN
  Administered 2013-07-20 – 2013-07-22 (×10): 2 via ORAL
  Filled 2013-07-20 (×11): qty 2

## 2013-07-20 MED ORDER — CALCIUM CITRATE 950 (200 CA) MG PO TABS
200.0000 mg | ORAL_TABLET | Freq: Every day | ORAL | Status: DC
  Administered 2013-07-20 – 2013-07-23 (×4): 200 mg via ORAL
  Filled 2013-07-20 (×5): qty 1

## 2013-07-20 MED ORDER — ALBUTEROL SULFATE (2.5 MG/3ML) 0.083% IN NEBU: 3.0000 mL | INHALATION_SOLUTION | Freq: Four times a day (QID) | RESPIRATORY_TRACT | Status: DC | PRN

## 2013-07-20 NOTE — Progress Notes (Addendum)
[facility] PROGRESS NOTE  Toni Myers:[id number] DOB: 1950-06-01 DOA: 07/19/2013 PCP: Toni Myers  Assessment/Plan  Closed fractures of right superior and inferior pubic rami, numbness of the right leg resolving slowly -  Continue percocet with fentanyl as needed for breakthrough pain -  WBAT per Toni Myers -  PT/OT evals -  Up with assistance  Weakness of right hand grip strength, pain in fourth finger.  I wonder if she has stretched, strained, or torn some of her flexor tendons when trying to catch herself on the counter -  X-ray of right hand, wrist, and fourth finger to rule out occult fracture -  OT -  F/u Hand Surgeon in 1 week, particularly if not improving  Hypertension, BP controlled.  Continue nebivolol  Depression stable.  Continue Cymbalta   Dementia, stable. Continue aricept  COPD (chronic obstructive pulmonary disease), stable. Continue symbicort and albuterol prn  Hypokalemia  Given 1 dose of vitamin K  Uveitis -  Followed by Toni Myers at [place] and needs new eye drop refill.    RA, continue prednisone and MTX  Diet:  Low salt Access:  PIV IVF:  off Proph:  lovenox  Code Status: full Family Communication: patient alone Disposition Plan: pending PT/OT evaluation, likely to rehab   Consultants:  Orthopedics, Toni Myers by phone about hand  Orthopedics, Toni Myers by phone about hip fractures  Procedures:  XR lumbar spine, pelvis, and right femur  XR right hand, wrist, and ring finger pending  Antibiotics:  none   HPI/Subjective:  Still severe pain in right leg, particularly when moving.  Right leg remains numb.  She noticed now that she cannot grip with her right hand and she is right-handed.  Pain in fourth finger right hand.    Objective: Filed Vitals:   07/19/13 2245 07/20/13 0047 07/20/13 0530 07/20/13 1417  BP: 125/75  120/72 129/80  Pulse: 90  76 67  Temp: 98.6 F (37 C)  98.2 F (36.8 C) 98.3  F (36.8 C)  TempSrc: Oral  Oral Oral  Resp: 18  18 16   Height:  5\' 4"  (1.626 m)    Weight:  66.2 kg (145 lb 15.1 oz)    SpO2: 96%  98% 96%    Intake/Output Summary (Last 24 hours) at 07/20/13 1431 Last data filed at 07/20/13 1231  Gross per 24 hour  Intake    240 ml  Output    650 ml  Net   -410 ml   Filed Weights   07/20/13 0047  Weight: 66.2 kg (145 lb 15.1 oz)    Exam:   General:  CF, No acute distress  HEENT:  NCAT, MMM  Cardiovascular:  RRR, nl S1, S2 no mrg, 2+ pulses, warm extremities  Respiratory:  CTAB, no increased WOB  Abdomen:   NABS, soft, NT/ND  MSK:   Normal tone and bulk, no LEE, right leg ROM limited by pain, FROM of left leg, right shoulder, elbow, wrist.    Neuro:  Right shoulder, elbow, and wrist, strength 5/5, FROM.  Normal supination/pronation of right hand, finger extrension and intrinsic muscles appear to be intact, however, when she tries to form a fist or grip, she can only close her fist partway.  Looks like she is holding an object in her right hand.  Sensation diminished over right leg to heel.  Has sensation between first and second toes.    Data Reviewed: Basic Metabolic Panel:  Recent Labs Lab 07/19/13 2000  NA 137  K 3.6*  CL 98  CO2 26  GLUCOSE 120*  BUN 14  CREATININE 0.66  CALCIUM 9.5   Liver Function Tests: No results found for this basename: AST, ALT, ALKPHOS, BILITOT, PROT, ALBUMIN,  in the last 168 hours No results found for this basename: LIPASE, AMYLASE,  in the last 168 hours No results found for this basename: AMMONIA,  in the last 168 hours CBC:  Recent Labs Lab 07/19/13 2000  WBC 8.4  NEUTROABS 7.0  HGB 12.1  HCT 35.2*  MCV 93.1  PLT 258   Cardiac Enzymes: No results found for this basename: CKTOTAL, CKMB, CKMBINDEX, TROPONINI,  in the last 168 hours BNP (last 3 results) No results found for this basename: PROBNP,  in the last 8760 hours CBG: No results found for this basename: GLUCAP,  in the  last 168 hours  No results found for this or any previous visit (from the past 240 hour(s)).   Studies: Dg Lumbar Spine 2-3 Views  07/19/2013   CLINICAL DATA:  Fall, lumbar pain  EXAM: LUMBAR SPINE - 2-3 VIEW  COMPARISON:  Lumbar spine CT dated 06/28/2013  FINDINGS: S-shaped thoracolumbar scoliosis.  Postsurgical changes at L5-S1.  No evidence of fracture or dislocation. Vertebral body heights are maintained.  IMPRESSION: No fracture or dislocation is seen.  Postsurgical changes at L5-S1.  S-shaped thoracolumbar scoliosis.   Electronically Signed   By: Toni Myers M.D.   On: 07/19/2013 20:22   Dg Pelvis 1-2 Views  07/19/2013   CLINICAL DATA:  Fall, right femur/lumbar pain, prior lumbar surgery  EXAM: PELVIS - 1-2 VIEW  COMPARISON:  None.  FINDINGS: Minimally displaced fracture involving the right superior pubic ramus.  Suspected nondisplaced fracture involving the right inferior pubic ramus.  Bilateral hip joint spaces are symmetric.  Postsurgical changes at L5-S1.  IMPRESSION: Right superior and inferior pubic rami fractures, as above.   Electronically Signed   By: Toni Myers M.D.   On: 07/19/2013 20:08   Dg Femur Right  07/19/2013   CLINICAL DATA:  Fall, right femur pain  EXAM: RIGHT FEMUR - 2 VIEW  COMPARISON:  None.  FINDINGS: Right pelvic ring fractures, as described on pelvic radiograph.  Right femur appears intact.  Right hip joint space is preserved.  IMPRESSION: No evidence of fracture or dislocation of the right femur.  Right pelvic ring fractures, as described on pelvic radiograph.   Electronically Signed   By: Toni Myers M.D.   On: 07/19/2013 20:09    Scheduled Meds: . budesonide-formoterol  2 puff Inhalation BID  . calcium citrate  200 mg of elemental calcium Oral Daily  . donepezil  10 mg Oral QHS  . enoxaparin (LOVENOX) injection  40 mg Subcutaneous Q24H  . escitalopram  5 mg Oral Daily  . folic acid  1 mg Oral Daily  . nebivolol  5 mg Oral Daily    Continuous Infusions:   Principal Problem:   Closed fracture of multiple pubic rami Active Problems:   HYPERTENSION   Depression   COPD (chronic obstructive pulmonary disease)    Time spent: 30 min    Toni Myers, [facility] Pager [phone]. If 7PM-7AM, please contact night-coverage at [email or web address], password [facility] 07/20/2013, 2:31 PM  LOS: 1 day

## 2013-07-20 NOTE — H&P (Signed)
[facility] History and Physical  LESSLIE MOSSA UYQ:[id number] DOB: September 16, 1950 DOA: 07/19/2013  Referring physician: Dr. Tomi Bamberger PCP: Cyndi Bender, PA-C   Chief Complaint:  Follow with pain over right hip  HPI:  63 year old female with history of hypertension, asthma, depression who presented to the ED after a mechanical fall when she tripped over her grandson at home and landed on her right hip. She denies sustaining any other injuries or loss of consciousness. Patient denies headache, dizziness, fever, chills, nausea , vomiting, chest pain, palpitations, SOB, abdominal pain, bowel or urinary symptoms. Denies change in weight or appetite.  Course in the ED  patient's vitals were stable. Blood work was unremarkable except for mild hypokalemia. X-ray of the pelvis showed to be right superior and inferior pubic rami which was minimally displaced. Orthopedics was reconsulted by ED recommended nonsurgical management with pain control, PT and weightbearing as tolerated. [facility] to [facility] floor.    Review of Systems:  Constitutional: Denies fever, chills, diaphoresis, appetite change and fatigue.  HEENT: Denies  congestion, sore throat, rhinorrhea,    Respiratory: Denies SOB, DOE, cough, chest tightness,  and wheezing.   Cardiovascular: Denies chest pain, palpitations and leg swelling.  Gastrointestinal: Denies nausea, vomiting, abdominal pain, diarrhea, constipation, blood in stool and abdominal distention.  Genitourinary: Denies dysuria, urgency, frequency, hematuria, flank pain and difficulty urinating.  Musculoskeletal: Denies myalgias, back pain, joint swelling, arthralgias and gait problem.  Skin: Denies pallor, rash and wound.  Neurological: Denies dizziness, seizures, syncope, weakness, light-headedness, numbness and headaches.  Psychiatric/Behavioral: Denies  confusion, nervousness, sleep disturbance and agitation   Past Medical History   Diagnosis Date  . Hypertension   . Cough   . Depression   . COPD (chronic obstructive pulmonary disease)   . RLS (restless legs syndrome)   . Chronic low back pain   . Migraine   . Palpitations   . Sjogrens syndrome   . Anxiety   . Shingles    Past Surgical History  Procedure Laterality Date  . Back surgery      07/1998 and 03/1999  . Tubal ligation     Social History:  reports that she has never smoked. She has never used smokeless tobacco. She reports that she drinks alcohol. She reports that she does not use illicit drugs.  Allergies  Allergen Reactions  . Morphine And Related Nausea And Vomiting    "makes me sick"    Family History  Problem Relation Age of Onset  . Arthritis Mother     Prior to Admission medications   Medication Sig Start Date End Date Taking? Authorizing Provider  albuterol (PROAIR HFA) 108 (90 BASE) MCG/ACT inhaler Inhale 2 puffs into the lungs every 6 (six) hours as needed.   Yes Historical Provider, MD  APAP-Isometheptene-Dichloral (EPIDRIN) 325-65-100 MG CAPS Take 1-2 capsules by mouth 2 (two) times daily as needed. For migraines   Yes Historical Provider, MD  budesonide-formoterol (SYMBICORT) 160-4.5 MCG/ACT inhaler Inhale 2 puffs into the lungs 2 (two) times daily.   Yes Historical Provider, MD  BYSTOLIC 5 MG tablet Take 5 mg by mouth daily. 1 by mouth daily 08/10/11  Yes Historical Provider, MD  calcium citrate (CALCITRATE - DOSED IN MG ELEMENTAL CALCIUM) 950 MG tablet Take 200 mg of elemental calcium by mouth daily.   Yes Historical Provider, MD  cyclobenzaprine (FLEXERIL) 10 MG tablet Take 10 mg by mouth at bedtime as needed. For spasms 08/25/11  Yes Historical Provider, MD  donepezil (  ARICEPT) 10 MG tablet Take 10 mg by mouth at bedtime.   Yes Historical Provider, MD  escitalopram (LEXAPRO) 5 MG tablet Take 5 mg by mouth daily.   Yes Historical Provider, MD  esomeprazole (NEXIUM) 40 MG capsule Take 40 mg by mouth 2 (two) times daily. 09/01/11  07/19/13 Yes Collene Gobble, MD  FOLIC ACID PO Take 1 tablet by mouth daily.   Yes Historical Provider, MD  methotrexate 25 MG/ML injection Inject 50 mg into the skin once a week.  07/05/13  Yes Historical Provider, MD  oxyCODONE-acetaminophen (PERCOCET/ROXICET) 5-325 MG per tablet Take 1 tablet by mouth every 8 (eight) hours as needed for severe pain.  05/27/13  Yes Historical Provider, MD  PRESCRIPTION MEDICATION Place 1 drop into both eyes 4 (four) times daily. Eye drop made from pt's own blood   Yes Historical Provider, MD     Physical Exam:  Filed Vitals:   07/19/13 1907 07/19/13 1917 07/19/13 2153 07/19/13 2245  BP:  117/80 133/74 125/75  Pulse:  77 88 90  Temp:  98 F (36.7 C)  98.6 F (37 C)  TempSrc:  Oral  Oral  Resp:  17 18 18   SpO2: 94% 96% 94% 96%    Constitutional: Vital signs reviewed.  Patient is an elderly female no acute distress. HEENT: no pallor, no icterus, moist oral mucosa, Cardiovascular: RRR, S1 normal, S2 normal, no MRG Chest: CTAB, no wheezes, rales, or rhonchi Abdominal: Soft. Non-tender, non-distended, bowel sounds are normal, no masses, organomegaly, or guarding present.  Ext: warm, tender to palpation over right hip with limited range of motion.  Neurological: A&O x3, non focal  Labs on Admission:  Basic Metabolic Panel:  Recent Labs Lab 07/19/13 2000  NA 137  K 3.6*  CL 98  CO2 26  GLUCOSE 120*  BUN 14  CREATININE 0.66  CALCIUM 9.5   Liver Function Tests: No results found for this basename: AST, ALT, ALKPHOS, BILITOT, PROT, ALBUMIN,  in the last 168 hours No results found for this basename: LIPASE, AMYLASE,  in the last 168 hours No results found for this basename: AMMONIA,  in the last 168 hours CBC:  Recent Labs Lab 07/19/13 2000  WBC 8.4  NEUTROABS 7.0  HGB 12.1  HCT 35.2*  MCV 93.1  PLT 258   Cardiac Enzymes: No results found for this basename: CKTOTAL, CKMB, CKMBINDEX, TROPONINI,  in the last 168 hours BNP: No components  found with this basename: POCBNP,  CBG: No results found for this basename: GLUCAP,  in the last 168 hours  Radiological Exams on Admission: Dg Lumbar Spine 2-3 Views  07/19/2013   CLINICAL DATA:  Fall, lumbar pain  EXAM: LUMBAR SPINE - 2-3 VIEW  COMPARISON:  Lumbar spine CT dated 06/28/2013  FINDINGS: S-shaped thoracolumbar scoliosis.  Postsurgical changes at L5-S1.  No evidence of fracture or dislocation. Vertebral body heights are maintained.  IMPRESSION: No fracture or dislocation is seen.  Postsurgical changes at L5-S1.  S-shaped thoracolumbar scoliosis.   Electronically Signed   By: Julian Hy M.D.   On: 07/19/2013 20:22   Dg Pelvis 1-2 Views  07/19/2013   CLINICAL DATA:  Fall, right femur/lumbar pain, prior lumbar surgery  EXAM: PELVIS - 1-2 VIEW  COMPARISON:  None.  FINDINGS: Minimally displaced fracture involving the right superior pubic ramus.  Suspected nondisplaced fracture involving the right inferior pubic ramus.  Bilateral hip joint spaces are symmetric.  Postsurgical changes at L5-S1.  IMPRESSION: Right superior and inferior pubic  rami fractures, as above.   Electronically Signed   By: Julian Hy M.D.   On: 07/19/2013 20:08   Dg Femur Right  07/19/2013   CLINICAL DATA:  Fall, right femur pain  EXAM: RIGHT FEMUR - 2 VIEW  COMPARISON:  None.  FINDINGS: Right pelvic ring fractures, as described on pelvic radiograph.  Right femur appears intact.  Right hip joint space is preserved.  IMPRESSION: No evidence of fracture or dislocation of the right femur.  Right pelvic ring fractures, as described on pelvic radiograph.   Electronically Signed   By: Julian Hy M.D.   On: 07/19/2013 20:09      Assessment/Plan  Principal Problem:   Closed fracture of multiple pubic rami Admit to MedSurg on observation Pain control with when necessary Percocet every 4 hrs, when necessary Dilaudid every 4 hours for severe pain and IV Robaxin for muscle spasms. Add bowel regimen -PT  evaluation -Orthopedics ( Dr Elmyra Ricks) consulted by ED physician who recommended nonsurgical management, PT and weightbearing as tolerated   Active Problems: Hypertension Resume home medications    Depression Continue Cymbalta    COPD (chronic obstructive pulmonary disease) Stable. Resume home inhalers.   Hypokalemia Replenished  Diet: Low-sodium  DVT prophylaxis: sq lovenox   Code Status:full code Family Communication: discussed with patient Disposition Plan: Pending PT eval  Kodi Steil, [place] [facility] Pager [phone]  Total time spent on admission : 50 minutes  If 7PM-7AM, please contact night-coverage [email or web address] Password [facility] 07/20/2013, 12:02 AM

## 2013-07-21 MED ORDER — POLYETHYLENE GLYCOL 3350 17 G PO PACK
17.0000 g | PACK | Freq: Two times a day (BID) | ORAL | Status: DC | PRN
  Administered 2013-07-21: 17 g via ORAL

## 2013-07-21 NOTE — Care Management Utilization Note (Signed)
UR complete    Graelyn Bihl,MSN,RN [phone] 

## 2013-07-21 NOTE — Progress Notes (Signed)
[facility] PROGRESS NOTE  Toni Myers QIW:[id number] DOB: 12/10/1950 DOA: 07/19/2013 PCP: Toni Myers  Assessment/Plan  Closed fractures of right superior and inferior pubic rami, numbness of the right leg resolving slowly -  Continue percocet with fentanyl as needed for breakthrough pain -  WBAT per Dr. Wynelle Link -  PT/OT evals:  Recommend CIR -  Up with assistance  Weakness of right hand grip strength, pain in fourth finger.  I wonder if she has stretched, strained, or torn some of her flexor tendons when trying to catch herself on the counter -  X-ray of right hand, wrist, and fourth finger to rule out occult fracture:  neg -  OT pending -  F/u Hand Surgeon in 1 week, particularly if not improving  Hypertension, BP controlled.  Continue nebivolol  Depression stable.  Continue Cymbalta   Dementia, stable. Continue aricept  COPD (chronic obstructive pulmonary disease), stable. Continue symbicort and albuterol prn  Hypokalemia  Given 1 dose of vitamin K  Uveitis -  Followed by Drs. Toni Myers and Toni Myers at [place] and needs new eye drop refill.    RA, continue prednisone and MTX  Diet:  Low salt Access:  PIV IVF:  off Proph:  lovenox  Code Status: full Family Communication: patient alone Disposition Plan: pending PT/OT evaluation, to rehab on Monday   Consultants:  Orthopedics, Dr. Amedeo Plenty by phone about hand  Orthopedics, Dr. Wynelle Link by phone about hip fractures  Procedures:  XR lumbar spine, pelvis, and right femur  XR right hand, wrist, and ring finger pending  Antibiotics:  none   HPI/Subjective:  Decreased pain and numbness right leg.  Able to move right hand fingers more today  Objective: Filed Vitals:   07/20/13 1417 07/20/13 2145 07/21/13 0530 07/21/13 1400  BP: 129/80 136/85 113/73 104/72  Pulse: 67 97 64 70  Temp: 98.3 F (36.8 C) 98.6 F (37 C) 98.2 F (36.8 C) 98.6 F (37 C)  TempSrc: Oral Oral Oral Oral  Resp: 16 16 16 16    Height:      Weight:      SpO2: 96% 91% 93% 93%    Intake/Output Summary (Last 24 hours) at 07/21/13 2018 Last data filed at 07/21/13 2000  Gross per 24 hour  Intake   1062 ml  Output   1650 ml  Net   -588 ml   Filed Weights   07/20/13 0047  Weight: 66.2 kg (145 lb 15.1 oz)    Exam:   General:  CF, No acute distress  HEENT:  NCAT, MMM  Cardiovascular:  RRR, nl S1, S2 no mrg, 2+ pulses, warm extremities  Respiratory:  CTAB, no increased WOB  Abdomen:   NABS, soft, NT/ND  MSK:   Normal tone and bulk, no LEE, right leg ROM limited by pain, FROM of left leg, right shoulder, elbow, wrist.    Neuro:  Right shoulder, elbow, and wrist, strength 5/5, FROM.  Normal supination/pronation of right hand, finger extrension and intrinsic muscles appear to be intact, however, when she tries to form a fist or grip, she can only close her fist partway.      Data Reviewed: Basic Metabolic Panel:  Recent Labs Lab 07/19/13 2000  NA 137  K 3.6*  CL 98  CO2 26  GLUCOSE 120*  BUN 14  CREATININE 0.66  CALCIUM 9.5   Liver Function Tests: No results found for this basename: AST, ALT, ALKPHOS, BILITOT, PROT, ALBUMIN,  in the last 168 hours No results found  for this basename: LIPASE, AMYLASE,  in the last 168 hours No results found for this basename: AMMONIA,  in the last 168 hours CBC:  Recent Labs Lab 07/19/13 2000  WBC 8.4  NEUTROABS 7.0  HGB 12.1  HCT 35.2*  MCV 93.1  PLT 258   Cardiac Enzymes: No results found for this basename: CKTOTAL, CKMB, CKMBINDEX, TROPONINI,  in the last 168 hours BNP (last 3 results) No results found for this basename: PROBNP,  in the last 8760 hours CBG: No results found for this basename: GLUCAP,  in the last 168 hours  No results found for this or any previous visit (from the past 240 hour(s)).   Studies: Dg Wrist Complete Right  07/20/2013   CLINICAL DATA:  [place] yesterday.  Right wrist and hand pain.  EXAM: RIGHT WRIST - COMPLETE 3+  VIEW  COMPARISON:  None.  FINDINGS: No fracture or dislocation. There are degenerative changes at the scaphoid, trapezium and trapezoid articulation. There is a well corticated bone fragment adjacent to the ulnar styloid which may be an accessory ossification center or an old fracture  The bones are diffusely demineralized. The soft tissues are unremarkable.  IMPRESSION: No acute fracture or dislocation.   Electronically Signed   By: Lajean Manes M.D.   On: 07/20/2013 15:36   Dg Hand Complete Right  07/20/2013   CLINICAL DATA:  [place] yesterday.  Hand and wrist pain.  EXAM: RIGHT HAND - COMPLETE 3+ VIEW  COMPARISON:  None.  FINDINGS: No fracture. No dislocation. There are osteoarthritic changes involving multiple interphalangeal joints most prominent at the index finger DIP joint. Bones are demineralized. Soft tissues are unremarkable.  IMPRESSION: No acute fracture or dislocation.   Electronically Signed   By: Lajean Manes M.D.   On: 07/20/2013 15:40    Scheduled Meds: . Autologous Serum Eye Drops  2 drop Both Eyes BID  . budesonide-formoterol  2 puff Inhalation BID  . calcium citrate  200 mg of elemental calcium Oral Daily  . donepezil  10 mg Oral QHS  . enoxaparin (LOVENOX) injection  40 mg Subcutaneous Q24H  . escitalopram  5 mg Oral Daily  . folic acid  1 mg Oral Daily  . nebivolol  5 mg Oral Daily   Continuous Infusions:   Principal Problem:   Closed fracture of multiple pubic rami Active Problems:   HYPERTENSION   Depression   COPD (chronic obstructive pulmonary disease)    Time spent: 30 min    [name], [facility] Pager [phone]. If 7PM-7AM, please contact night-coverage at [email or web address], password [facility] 07/21/2013, 8:18 PM  LOS: 2 days

## 2013-07-21 NOTE — Progress Notes (Signed)
07/21/13 1500  PT Visit Information  Last PT Received On 07/21/13  Assistance Needed +1  History of Present Illness pt sustained fall at home after tripping over her grandson;X-ray of the pelvis showed to be right superior and inferior pubic rami which was minimally displaced. Orthopedics was reconsulted by ED recommended nonsurgical management with pain control, PT and weightbearing as tolerated. [facility] for [facility] to medical floor.  PT Time Calculation  PT Start Time 1531  PT Stop Time 1545  PT Time Calculation (min) 14 min  Subjective Data  Patient Stated Goal return to PLOF  Precautions  Precautions Fall  Restrictions  Other Position/Activity Restrictions WBAT  Cognition  Arousal/Alertness Awake/alert  Behavior During Therapy WFL for tasks assessed/performed  Overall Cognitive Status Within Functional Limits for tasks assessed  Bed Mobility  Overal bed mobility +2 for physical assistance;Needs Assistance  Bed Mobility Sit to Supine  Sit to supine +2 for physical assistance;Max assist  General bed mobility comments bed  pad used to scoot laterally, assist with UB and LEs  Transfers  Overall transfer level Needs assistance  Equipment used Rolling walker (2 wheeled)  Transfers Sit to/from [employer] Transfers  Sit to Stand +2 physical assistance;Mod assist  Stand pivot transfers +2 physical assistance;Max assist  General transfer comment +2 for wt shift, assist with RLE  Encouraged ankle pumps; still with c/o some numbness RLE, ankle movement intact  PT - End of Session  Equipment Utilized During Treatment Gait belt  Activity Tolerance Patient limited by pain  Patient left in bed;with call bell/phone within reach;with family/visitor present  Nurse Communication Mobility status  PT - Assessment/Plan  PT Plan Current plan remains appropriate  PT Frequency Min 4X/week  Follow Up Recommendations SNF (vs CIR pt request)  PT equipment Rolling  walker with 5" wheels  PT Goal Progression  Progress towards PT goals Progressing toward goals  Acute Rehab PT Goals  PT Goal Formulation With patient  Time For Goal Achievement 07/28/13  Potential to Achieve Goals Good  PT General Charges  $$ ACUTE PT VISIT 1 Procedure  PT Treatments  $Therapeutic Activity 8-22 mins

## 2013-07-21 NOTE — Evaluation (Signed)
Physical Therapy Evaluation Patient Details Name: Toni Myers MRN: [id number] DOB: 1951-04-08 Today's Date: 07/21/2013 Time: 1220-1252 PT Time Calculation (min): 32 min  PT Assessment / Plan / Recommendation History of Present Illness   pt sustained fall at home after tripping over her grandson;X-ray of the pelvis showed to be right superior and inferior pubic rami which was minimally displaced. Orthopedics was reconsulted by ED recommended nonsurgical management with pain control, PT and weightbearing as tolerated. [facility] for [facility] to medical floor.  Clinical Impression  Pt will benefit from PT to address deficits below; Pt is requesting CIR, have briefly discussed the process, pt may need longer term rehab at SNF level    PT Assessment  Patient needs continued PT services    Follow Up Recommendations  SNF (pt is requesting CIR)    Does the patient have the potential to tolerate intense rehabilitation      Barriers to Discharge        Equipment Recommendations  Rolling walker with 5" wheels    Recommendations for Other Services     Frequency Min 4X/week    Precautions / Restrictions Precautions Precautions: Fall Restrictions Other Position/Activity Restrictions: WBAT   Pertinent Vitals/Pain 10/10 pain      Mobility  Bed Mobility Overal bed mobility: +2 for physical assistance;Needs Assistance Bed Mobility: Supine to Sit Supine to sit: Total assist;+2 for physical assistance General bed mobility comments: bed  pad used to scoot laterally, assist with UB and LEs Transfers Overall transfer level: Needs assistance Equipment used: Rolling walker (2 wheeled) Transfers: Sit to/from [employer] Transfers Sit to Stand: +2 physical assistance;Max assist Stand pivot transfers: +2 physical assistance;Max assist General transfer comment: +2 for wt shift and balance; cues for technique throughout; environment manipulated to allow transfer to chair  due to mobility limited due to pain    Exercises     PT Diagnosis: Difficulty walking;Acute pain  PT Problem List: Decreased activity tolerance;Decreased balance;Decreased mobility;Decreased knowledge of use of DME;Decreased knowledge of precautions;Decreased range of motion;Pain PT Treatment Interventions: DME instruction;Gait training;Functional mobility training;Therapeutic activities;Therapeutic exercise;Patient/family education     PT Goals(Current goals can be found in the care plan section) Acute Rehab PT Goals Patient Stated Goal: return to PLOF PT Goal Formulation: With patient Time For Goal Achievement: 07/28/13 Potential to Achieve Goals: Good  Visit Information  Last PT Received On: 07/21/13 History of Present Illness:  pt sustained fall at home after tripping over her grandson;X-ray of the pelvis showed to be right superior and inferior pubic rami which was minimally displaced. Orthopedics was reconsulted by ED recommended nonsurgical management with pain control, PT and weightbearing as tolerated. [facility] for [facility] to medical floor.       Prior Friend expects to be discharged to:: Inpatient rehab (pt requests)    Cognition  Cognition Arousal/Alertness: Awake/alert Behavior During Therapy: WFL for tasks assessed/performed Overall Cognitive Status: Within Functional Limits for tasks assessed    Extremity/Trunk Assessment Upper Extremity Assessment Upper Extremity Assessment: RUE deficits/detail RUE Deficits / Details: right hand limited AROM, mildly edematous; defer further assessment to OT/PT at later session Lower Extremity Assessment Lower Extremity Assessment: RLE deficits/detail;LLE deficits/detail RLE Deficits / Details: pt reports numbness to light touch distal RLE/heel RLE: Unable to fully assess due to pain LLE Deficits / Details: AAROM WFL LLE: Unable to fully assess due to pain   Balance  Balance Overall balance assessment: History of Falls;Needs assistance Sitting-balance support:  Feet supported;Bilateral upper extremity supported Sitting balance-Leahy Scale: Poor Standing balance-Leahy Scale: Zero  End of Session PT - End of Session Equipment Utilized During Treatment: Gait belt Activity Tolerance: Patient limited by pain Patient left: in chair;with call bell/phone within reach Nurse Communication: Mobility status  GP Functional Assessment Tool Used: clinical judgement Functional Limitation: Mobility: Walking and moving around Mobility: Walking and Moving Around Current Status (B3532): At least 80 percent but less than 100 percent impaired, limited or restricted Mobility: Walking and Moving Around Goal Status [phone]): At least 60 percent but less than 80 percent impaired, limited or restricted   [facility] 07/21/2013, 1:18 PM

## 2013-07-21 NOTE — Progress Notes (Signed)
Clinical Social Work Department BRIEF PSYCHOSOCIAL ASSESSMENT 07/21/2013  Patient:  Toni Myers, Toni Myers     Account Number:  [id number]     Admit date:  07/19/2013  Clinical Social Worker:  Levie Heritage  Date/Time:  07/21/2013 03:03 PM  Referred by:  Physician  Date Referred:  07/21/2013 Referred for  SNF Placement   Other Referral:   Interview type:  Patient Other interview type:    PSYCHOSOCIAL DATA Living Status:  FAMILY Admitted from facility:   Level of care:   Primary support name:  Dolphus Jenny Primary support relationship to patient:  CHILD, ADULT Degree of support available:   strong    CURRENT CONCERNS Current Concerns  Post-Acute Placement   Other Concerns:    SOCIAL WORK ASSESSMENT / PLAN Met with Pt and family at bedside.    Pt stated that she is aware that PT recommended SNF.  Pt is hopeful for CIR and stated that this is her first choice. CSW explained the need for a backup plan, as sometimes CIR isn't an option.  Pt was agreeable and asked that CSW send her information to [facility] and [employer].    CSW provided Pt with a SNF list and answered all SNF questions.    CSW thanked Pt and her family for their time.   Assessment/plan status:  Psychosocial Support/Ongoing Assessment of Needs Other assessment/ plan:   Information/referral to community resources:   SNF list    PATIENT'S/FAMILY'S RESPONSE TO PLAN OF CARE: Pt was calm, cooperative and pleasant.    Pt is agreeable to some form of rehab and really wants CIR. She will settle for SNF if CIR isn't an option.  She stated that she is fine with either place.    Pt coping well with her current medical condition and has good family support.    Pt and family thanked CSW for time and assistance.   Bernita Raisin, [place] [facility] [phone]

## 2013-07-21 NOTE — Progress Notes (Signed)
Clinical Social Work Department CLINICAL SOCIAL WORK PLACEMENT NOTE 07/21/2013  Patient:  Toni Myers, Toni Myers  Account Number:  [id number] Admit date:  07/19/2013  Clinical Social Worker:  Levie Heritage  Date/time:  07/21/2013 03:02 PM  Clinical Social Work is seeking post-discharge placement for this patient at the following level of care:   SKILLED NURSING   (*CSW will update this form in Epic as items are completed)   07/21/2013  Patient/family provided with [place] Department of Clinical Social Work's list of facilities offering this level of care within the geographic area requested by the patient (or if unable, by the patient's family).  07/21/2013  Patient/family informed of their freedom to choose among providers that offer the needed level of care, that participate in Medicare, Medicaid or managed care program needed by the patient, have an available bed and are willing to accept the patient.  07/21/2013  Patient/family informed of [facility]' ownership interest in [facility], as well as of the fact that they are under no obligation to receive care at this facility.  PASARR submitted to EDS on 07/21/2013 PASARR number received from EDS on 07/21/2013  FL2 transmitted to all facilities in geographic area requested by pt/family on  07/21/2013 FL2 transmitted to all facilities within larger geographic area on   Patient informed that his/her managed care company has contracts with or will negotiate with  certain facilities, including the following:     Patient/family informed of bed offers received:   Patient chooses bed at  Physician recommends and patient chooses bed at    Patient to be transferred to  on   Patient to be transferred to facility by   The following physician request were entered in Epic:   Additional Comments:  Bernita Raisin, [place] Work [phone]

## 2013-07-22 DIAGNOSIS — W19XXXA Unspecified fall, initial encounter: Secondary | ICD-10-CM

## 2013-07-22 DIAGNOSIS — H209 Unspecified iridocyclitis: Secondary | ICD-10-CM

## 2013-07-22 DIAGNOSIS — J441 Chronic obstructive pulmonary disease with (acute) exacerbation: Secondary | ICD-10-CM

## 2013-07-22 DIAGNOSIS — S329XXA Fracture of unspecified parts of lumbosacral spine and pelvis, initial encounter for closed fracture: Secondary | ICD-10-CM

## 2013-07-22 DIAGNOSIS — S6390XA Sprain of unspecified part of unspecified wrist and hand, initial encounter: Secondary | ICD-10-CM

## 2013-07-22 DIAGNOSIS — IMO0002 Reserved for concepts with insufficient information to code with codable children: Secondary | ICD-10-CM

## 2013-07-22 DIAGNOSIS — M069 Rheumatoid arthritis, unspecified: Secondary | ICD-10-CM

## 2013-07-22 MED ORDER — CYCLOBENZAPRINE HCL 10 MG PO TABS: 10.0000 mg | ORAL_TABLET | Freq: Every evening | ORAL | Status: DC | PRN

## 2013-07-22 MED ORDER — NONFORMULARY OR COMPOUNDED ITEM
1.0000 [drp] | Freq: Two times a day (BID) | Status: DC
  Administered 2013-07-22 – 2013-07-23 (×3): 1 [drp] via OPHTHALMIC

## 2013-07-22 MED ORDER — DSS 100 MG PO CAPS: 100.0000 mg | ORAL_CAPSULE | Freq: Every day | ORAL | Status: DC | PRN

## 2013-07-22 MED ORDER — NONFORMULARY OR COMPOUNDED ITEM: 1.0000 [drp] | Freq: Two times a day (BID) | Status: DC

## 2013-07-22 MED ORDER — OXYCODONE-ACETAMINOPHEN 5-325 MG PO TABS: 1.0000 | ORAL_TABLET | Freq: Four times a day (QID) | ORAL | Status: DC | PRN

## 2013-07-22 MED ORDER — POLYETHYLENE GLYCOL 3350 17 G PO PACK: 17.0000 g | PACK | Freq: Two times a day (BID) | ORAL | Status: DC | PRN

## 2013-07-22 MED ORDER — PREDNISONE 50 MG PO TABS
60.0000 mg | ORAL_TABLET | Freq: Every day | ORAL | Status: DC
  Administered 2013-07-22 – 2013-07-23 (×2): 60 mg via ORAL
  Filled 2013-07-22 (×3): qty 1

## 2013-07-22 MED ORDER — PREDNISONE 10 MG PO TABS: ORAL_TABLET | ORAL | Status: DC

## 2013-07-22 MED ORDER — DOXYCYCLINE HYCLATE 100 MG PO TABS
100.0000 mg | ORAL_TABLET | Freq: Two times a day (BID) | ORAL | Status: DC
  Administered 2013-07-22 – 2013-07-23 (×3): 100 mg via ORAL
  Filled 2013-07-22 (×4): qty 1

## 2013-07-22 MED ORDER — METHOCARBAMOL 500 MG PO TABS: 500.0000 mg | ORAL_TABLET | Freq: Four times a day (QID) | ORAL | Status: DC | PRN

## 2013-07-22 MED ORDER — METHOTREXATE SODIUM CHEMO INJECTION 25 MG/ML: 12.5000 mg | INTRAMUSCULAR | Status: DC

## 2013-07-22 MED ORDER — IPRATROPIUM-ALBUTEROL 0.5-2.5 (3) MG/3ML IN SOLN
3.0000 mL | Freq: Three times a day (TID) | RESPIRATORY_TRACT | Status: DC
  Administered 2013-07-22 – 2013-07-23 (×4): 3 mL via RESPIRATORY_TRACT
  Filled 2013-07-22 (×4): qty 3

## 2013-07-22 MED ORDER — DOXYCYCLINE HYCLATE 100 MG PO TABS: 100.0000 mg | ORAL_TABLET | Freq: Two times a day (BID) | ORAL | Status: DC

## 2013-07-22 NOTE — Evaluation (Signed)
Occupational Therapy Evaluation Patient Details Name: Toni Myers MRN: [id number] DOB: [birth year] Today's Date: 07/22/2013 Time: 1230-1309 OT Time Calculation (min): 39 min  OT Assessment / Plan / Recommendation History of present illness  pt sustained fall at home after tripping over her grandson;X-ray of the pelvis showed to be right superior and inferior pubic rami which was minimally displaced. Orthopedics was reconsulted by ED recommended nonsurgical management with pain control, PT and weightbearing as tolerated. [facility] to medical floor.   Clinical Impression   Pt presents to OT with decreased I with ADL activity s/p fall . Pt will benefit from skilled OT to increase I with ADL activity and return to PLOF    OT Assessment  Patient needs continued OT Services    Follow Up Recommendations  CIR       Equipment Recommendations  None recommended by OT       Frequency  Min 2X/week    Precautions / Restrictions Precautions Precautions: Fall Restrictions Other Position/Activity Restrictions: WBAT       ADL       OT Diagnosis: Generalized weakness;Acute pain  OT Problem List: Decreased strength;Decreased activity tolerance;Impaired balance (sitting and/or standing) OT Treatment Interventions: Self-care/ADL training;Patient/family education;DME and/or AE instruction   OT Goals(Current goals can be found in the care plan section) Acute Rehab OT Goals Patient Stated Goal: return to PLOF OT Goal Formulation: With patient  Visit Information  Last OT Received On: 07/22/13 Assistance Needed: +2 History of Present Illness:  pt sustained fall at home after tripping over her grandson;X-ray of the pelvis showed to be right superior and inferior pubic rami which was minimally displaced. Orthopedics was reconsulted by ED recommended nonsurgical management with pain control, PT and weightbearing as tolerated. [facility] for admission  to medical floor.       Prior [place] expects to be discharged to:: Inpatient rehab (pt requests)         Vision/Perception Vision - History Patient Visual Report: No change from baseline Vision - Assessment Eye Alignment: Within Functional Limits   Cognition  Cognition Arousal/Alertness: Awake/alert Behavior During Therapy: WFL for tasks assessed/performed Overall Cognitive Status: Within Functional Limits for tasks assessed    Extremity/Trunk Assessment Upper Extremity Assessment Upper Extremity Assessment: Overall WFL for tasks assessed     Mobility Bed Mobility Overal bed mobility: +2 for physical assistance;Needs Assistance Bed Mobility: Supine to Sit Supine to sit: +2 for physical assistance;Mod assist General bed mobility comments: bed  pad used to scoot laterally, assist with UB and LEs Transfers Overall transfer level: Needs assistance Equipment used: Rolling walker (2 wheeled) Sit to Stand: +2 physical assistance;Mod assist Stand pivot transfers: +2 physical assistance;Max assist General transfer comment: +2 for wt shift, assist with RLE     Exercise General Exercises - Lower Extremity Ankle Circles/Pumps: AROM;Both;10 reps;Supine Quad Sets: AROM;Both;5 reps;Seated Gluteal Sets: AROM;Both;5 reps;Seated   Balance Balance Sitting-balance support: Bilateral upper extremity supported Sitting balance-Leahy Scale: Fair Standing balance support: Bilateral upper extremity supported Standing balance-Leahy Scale: Poor   End of Session OT - End of Session Activity Tolerance: Patient tolerated treatment well Patient left: in chair;with call bell/phone within reach Nurse Communication: Mobility status  GO     Betsy Pries 07/22/2013, 1:48 PM

## 2013-07-22 NOTE — Progress Notes (Signed)
CSW assisting with d/c planning. CIR is working with [facility] for authorization. If [employer] does not approve CIR admission, pt will consider SNF placement. [facility] has been contacted at pt's request. SNF may be able to assist . Awaiting return call. CSW will continue to follow to assist with d/c planning.  Werner Lean LCSW [phone]

## 2013-07-22 NOTE — Progress Notes (Signed)
CSW assisting with d/c planning. [facility] is able to offer [facility] bed on Tuesday. Pt is in agreement with this plan. CSW will assist with d/c planning to SNF in am  Werner Lean LCSW [phone]

## 2013-07-22 NOTE — Progress Notes (Signed)
Physical Therapy Treatment Patient Details Name: Toni Myers MRN: [id number] DOB: January 30, 1951 Today's Date: 07/22/2013 Time: 1000-1017 PT Time Calculation (min): 17 min  PT Assessment / Plan / Recommendation  History of Present Illness  pt sustained fall at home after tripping over her grandson;X-ray of the pelvis showed to be right superior and inferior pubic rami which was minimally displaced. Orthopedics was reconsulted by ED recommended nonsurgical management with pain control, PT and weightbearing as tolerated. [facility] for [facility] to medical floor.   PT Comments   *Good progress with mobility. She walked 10' with assist to advance RLE. Instructed pt in exercises.  **  Follow Up Recommendations  SNF (vs CIR pt request)     Does the patient have the potential to tolerate intense rehabilitation     Barriers to Discharge        Equipment Recommendations  Rolling walker with 5" wheels    Recommendations for Other Services    Frequency Min 4X/week   Progress towards PT Goals Progress towards PT goals: Progressing toward goals  Plan Current plan remains appropriate    Precautions / Restrictions Precautions Precautions: Fall Restrictions Weight Bearing Restrictions: No Other Position/Activity Restrictions: WBAT   Pertinent Vitals/Pain *"98/10" R pelvis with walking Premedicated Ice applied**    Mobility  Bed Mobility Overal bed mobility: +2 for physical assistance;Needs Assistance Bed Mobility: Supine to Sit Supine to sit: +2 for physical assistance;Mod assist General bed mobility comments: bed  pad used to scoot laterally, assist with UB and LEs Transfers Overall transfer level: Needs assistance Equipment used: Rolling walker (2 wheeled) Sit to Stand: +2 physical assistance;Mod assist General transfer comment: +2 for wt shift, assist with RLE Ambulation/Gait Ambulation/Gait assistance: +2 physical assistance;Mod assist Ambulation Distance (Feet):  10 Feet Assistive device: Rolling walker (2 wheeled) Gait Pattern/deviations: Step-to pattern;Decreased step length - right;Decreased step length - left;Decreased stance time - right;Antalgic;Trunk flexed Gait velocity interpretation: Below normal speed for age/gender General Gait Details: Max assist to advance RLE, VCs for sequencing, distance limited by pain/fatigue    Exercises General Exercises - Lower Extremity Ankle Circles/Pumps: AROM;Both;10 reps;Supine Quad Sets: AROM;Both;5 reps;Seated Gluteal Sets: AROM;Both;5 reps;Seated   PT Diagnosis:    PT Problem List:   PT Treatment Interventions:     PT Goals (current goals can now be found in the care plan section) Acute Rehab PT Goals Patient Stated Goal: return to PLOF PT Goal Formulation: With patient Time For Goal Achievement: 07/28/13 Potential to Achieve Goals: Good  Visit Information  Last PT Received On: 07/22/13 Assistance Needed: +2 History of Present Illness:  pt sustained fall at home after tripping over her grandson;X-ray of the pelvis showed to be right superior and inferior pubic rami which was minimally displaced. Orthopedics was reconsulted by ED recommended nonsurgical management with pain control, PT and weightbearing as tolerated. [facility] for [facility] to medical floor.    Subjective Data  Patient Stated Goal: return to PLOF   Cognition  Cognition Arousal/Alertness: Awake/alert Behavior During Therapy: WFL for tasks assessed/performed Overall Cognitive Status: Within Functional Limits for tasks assessed    Balance  Balance Sitting-balance support: Bilateral upper extremity supported Sitting balance-Leahy Scale: Fair Standing balance support: Bilateral upper extremity supported Standing balance-Leahy Scale: Poor  End of Session PT - End of Session Equipment Utilized During Treatment: Gait belt Activity Tolerance: Patient limited by pain Patient left: with call bell/phone within  reach;in chair Nurse Communication: Mobility status   GP  Blondell Reveal Kistler 07/22/2013, 10:23 AM [phone]

## 2013-07-22 NOTE — Progress Notes (Signed)
Rehab Admissions Coordinator Note:  Patient was screened by Retta Diones for appropriateness for an Inpatient Acute Rehab Consult.  At this time, an inpatient rehab consult has already been ordered and is pending completion.  Retta Diones 07/22/2013, 8:42 AM  I can be reached at [phone].

## 2013-07-22 NOTE — Discharge Summary (Signed)
Physician Discharge Summary  Toni Myers [id number] DOB: [birth year] DOA: 07/19/2013  PCP: Fae Pippin  Admit date: 07/19/2013 Discharge date: 07/22/2013  Recommendations for Outpatient Follow-up:  1. Follow up with [facility] Op[facility]mology Dr. Kathlen Mody or Clement Husbands at already scheduled appointment 2. Follow up with Dr. Wynelle Link within 2 weeks of discharge please. 3. If hand function is not returning, please schedule follow up appointment with Dr. Amedeo Plenty 4. Ongoing PT/OT 5. Continue prednisone taper:  5 tabs daily on 3/3, 4 tabs on 3/4, 3 tabs on 3/5, 2 tabs on 3/6, 1 tab on 3/7, then stop.   6. Continue doxycycline through 3/6, then stop 7. Methotrexate 12.5mg  is administered via injection every Wed  Discharge Diagnoses:  Principal Problem:   Closed fracture of multiple pubic rami Active Problems:   HYPERTENSION   Depression   RLS (restless legs syndrome)   Sjogren's disease   Strain of tendons of finger of right hand   COPD with acute exacerbation   Uveitis   Rheumatoid arthritis   Discharge Condition: stable, improved  Diet recommendation: low salt  Wt Readings from Last 3 Encounters:  07/20/13 66.2 kg (145 lb 15.1 oz)  09/06/12 62.959 kg (138 lb 12.8 oz)  07/18/12 64.411 kg (142 lb)    History of present illness:  63 year old female with history of hypertension, asthma, depression who presented to the ED after a mechanical fall when she tripped over her grandson at home and landed on her right hip. She denies sustaining any other injuries or loss of consciousness.  Patient denies headache, dizziness, fever, chills, nausea , vomiting, chest pain, palpitations, SOB, abdominal pain, bowel or urinary symptoms. Denies change in weight or appetite.  Course in the ED  patient's vitals were stable. Blood work was unremarkable except for mild hypokalemia. X-ray of the pelvis showed to be right superior and inferior pubic rami which was minimally displaced. Orthopedics was  reconsulted by ED recommended nonsurgical management with pain control, PT and weightbearing as tolerated. [facility] for [facility] to medical floor.   Hospital Course:   Closed fractures of right superior and inferior pubic rami, numbness of the right leg resolving slowly  - Continue percocet with fentanyl as needed for breakthrough pain  - WBAT per Dr. Wynelle Link  - PT/OT evals: Recommend CIR or SNF as alternative - Up with assistance   Weakness of right hand grip strength, pain in fourth finger. Likely stretched, strained her flexor tendons when trying to catch herself on the counter  - X-ray of right hand, wrist, and fourth finger to rule out occult fracture: neg  - Continue OT - F/u Hand Surgeon in 1 week, particularly if not improving   Hypertension, BP controlled. Continue nebivolol  Depression stable. Continue Cymbalta  Dementia, stable. Continue aricept  COPD (chronic obstructive pulmonary disease) with acute exacerbation, likely due to being sedentary the last few days.  Continued symbicort and started Joey Lierman prednisone taper with doxycycline.  Duonebs prn while inpatient and may use albuterol MDI at discharge. Hypokalemia Given 1 dose of potassium  Uveitis Followed by Drs. Kathlen Mody and [place] at [place] and needs new eye drop refill.  RA, continue MTX  Consultants:  Orthopedics, Dr. Amedeo Plenty by phone about hand  Orthopedics, Dr. Wynelle Link by phone about hip fractures Procedures:  XR lumbar spine, pelvis, and right femur  XR right hand, wrist, and ring finger pending Antibiotics:  Doxycycline 3/2 (5 day course)   Discharge Exam: Filed Vitals:   07/22/13 1339  BP:  110/74  Pulse: 72  Temp: 98.3 F (36.8 C)  Resp: 18   Filed Vitals:   07/21/13 2100 07/22/13 0420 07/22/13 1339 07/22/13 1447  BP: 163/75 107/69 110/74   Pulse: 75 66 72   Temp: 98.7 F (37.1 C) 98.4 F (36.9 C) 98.3 F (36.8 C)   TempSrc: Oral Oral Oral   Resp: 16 20 18    Height:       Weight:      SpO2: 96% 98% 96% 93%    General: CF, No acute distress  HEENT: NCAT, MMM  Cardiovascular: RRR, nl S1, S2 no mrg, 2+ pulses, warm extremities  Respiratory: Rhonchorous, no focal rales, full exp wheeze, no increased WOB  Abdomen: NABS, soft, NT/ND  MSK: Normal tone and bulk, no LEE, right leg ROM limited by pain, FROM of left leg, right shoulder, elbow, wrist.  Neuro: Right shoulder, elbow, and wrist, strength 5/5, FROM. Normal supination/pronation of right hand, finger extrension and intrinsic muscles appear to be intact.  Can form loose grip today    Discharge Instructions      Discharge Orders   Future Orders Complete By Expires   Call MD for:  difficulty breathing, headache or visual disturbances  As directed    Call MD for:  extreme fatigue  As directed    Call MD for:  hives  As directed    Call MD for:  persistant dizziness or light-headedness  As directed    Call MD for:  persistant nausea and vomiting  As directed    Call MD for:  severe uncontrolled pain  As directed    Call MD for:  temperature >100.4  As directed    Diet - low sodium heart healthy  As directed    Increase activity slowly  As directed    Weight bearing as tolerated  As directed    Questions:     Laterality:     Extremity:         Medication List         budesonide-formoterol 160-4.5 MCG/ACT inhaler  Commonly known as:  SYMBICORT  Inhale 2 puffs into the lungs 2 (two) times daily.     BYSTOLIC 5 MG tablet  Generic drug:  nebivolol  Take 5 mg by mouth daily. 1 by mouth daily     calcium citrate 950 MG tablet  Commonly known as:  CALCITRATE - dosed in mg elemental calcium  Take 200 mg of elemental calcium by mouth daily.     cyclobenzaprine 10 MG tablet  Commonly known as:  FLEXERIL  Take 1 tablet (10 mg total) by mouth at bedtime as needed. For spasms     donepezil 10 MG tablet  Commonly known as:  ARICEPT  Take 10 mg by mouth at bedtime.     doxycycline 100 MG tablet   Commonly known as:  VIBRA-TABS  Take 1 tablet (100 mg total) by mouth every 12 (twelve) hours.     DSS 100 MG Caps  Take 100 mg by mouth daily as needed for mild constipation.     EPIDRIN 325-65-100 MG Caps  Take 1-2 capsules by mouth 2 (two) times daily as needed. For migraines     escitalopram 5 MG tablet  Commonly known as:  LEXAPRO  Take 5 mg by mouth daily.     esomeprazole 40 MG capsule  Commonly known as:  NEXIUM  Take 40 mg by mouth 2 (two) times daily.     FOLIC ACID PO  Take 1 tablet by mouth daily.     methocarbamol 500 MG tablet  Commonly known as:  ROBAXIN  Take 1 tablet (500 mg total) by mouth every 6 (six) hours as needed for muscle spasms.     methotrexate 25 MG/ML injection  Inject 0.5 mLs (12.5 mg total) into the skin once a week.     oxyCODONE-acetaminophen 5-325 MG per tablet  Commonly known as:  PERCOCET/ROXICET  Take 1-2 tablets by mouth every 6 (six) hours as needed for severe pain.     polyethylene glycol packet  Commonly known as:  MIRALAX / GLYCOLAX  Take 17 g by mouth 2 (two) times daily as needed for moderate constipation.     predniSONE 10 MG tablet  Commonly known as:  DELTASONE  Take 5 tabs x 1 day, 4 tabs x 1 day, 3 tabs x 1 day, 2 tabs x 1 day, 1 tab x 1 day, then stop.     PRESCRIPTION MEDICATION  Place 1 drop into both eyes 4 (four) times daily. Eye drop made from pt's own blood     PROAIR HFA 108 (90 BASE) MCG/ACT inhaler  Generic drug:  albuterol  Inhale 2 puffs into the lungs every 6 (six) hours as needed.       Follow-up Information   Follow up with Grant Fontana, MD.   Specialty:  Ophthalmology   Contact information:   [address] [place] [address] [phone]       Follow up with Loanne Drilling, MD In 1 month.   Specialty:  Orthopedic Surgery   Contact information:   [address] [place] [address] [phone]       Follow up with Karen Chafe, MD In 1 week.    Specialty:  Orthopedic Surgery   Contact information:   [address] [place] [address] [phone]       The results of significant diagnostics from this hospitalization (including imaging, microbiology, ancillary and laboratory) are listed below for reference.    Significant Diagnostic Studies: Dg Lumbar Spine 2-3 Views  07/19/2013   CLINICAL DATA:  Fall, lumbar pain  EXAM: LUMBAR SPINE - 2-3 VIEW  COMPARISON:  Lumbar spine CT dated 06/28/2013  FINDINGS: S-shaped thoracolumbar scoliosis.  Postsurgical changes at L5-S1.  No evidence of fracture or dislocation. Vertebral body heights are maintained.  IMPRESSION: No fracture or dislocation is seen.  Postsurgical changes at L5-S1.  S-shaped thoracolumbar scoliosis.   Electronically Signed   By: Charline Bills M.D.   On: 07/19/2013 20:22   Dg Pelvis 1-2 Views  07/19/2013   CLINICAL DATA:  Fall, right femur/lumbar pain, prior lumbar surgery  EXAM: PELVIS - 1-2 VIEW  COMPARISON:  None.  FINDINGS: Minimally displaced fracture involving the right superior pubic ramus.  Suspected nondisplaced fracture involving the right inferior pubic ramus.  Bilateral hip joint spaces are symmetric.  Postsurgical changes at L5-S1.  IMPRESSION: Right superior and inferior pubic rami fractures, as above.   Electronically Signed   By: Charline Bills M.D.   On: 07/19/2013 20:08   Dg Wrist Complete Right  07/20/2013   CLINICAL DATA:  [place] yesterday.  Right wrist and hand pain.  EXAM: RIGHT WRIST - COMPLETE 3+ VIEW  COMPARISON:  None.  FINDINGS: No fracture or dislocation. There are degenerative changes at the scaphoid, trapezium and trapezoid articulation. There is a well corticated bone fragment adjacent to the ulnar styloid which may be an accessory ossification center  or an old fracture  The bones are diffusely demineralized. The soft tissues are unremarkable.  IMPRESSION: No acute fracture or dislocation.   Electronically Signed   By: Lajean Manes M.D.   On: 07/20/2013 15:36   Dg Femur Right  07/19/2013   CLINICAL DATA:  Fall, right femur pain  EXAM: RIGHT FEMUR - 2 VIEW  COMPARISON:  None.  FINDINGS: Right pelvic ring fractures, as described on pelvic radiograph.  Right femur appears intact.  Right hip joint space is preserved.  IMPRESSION: No evidence of fracture or dislocation of the right femur.  Right pelvic ring fractures, as described on pelvic radiograph.   Electronically Signed   By: Julian Hy M.D.   On: 07/19/2013 20:09   Ct Lumbar Spine Wo Contrast  06/28/2013   CLINICAL DATA:  Increasing chronic low back pain. Bilateral hip pain and left leg pain.  EXAM: CT LUMBAR SPINE WITHOUT CONTRAST  TECHNIQUE: Multidetector CT imaging of the lumbar spine was performed without intravenous contrast administration. Multiplanar CT image reconstructions were also generated.  COMPARISON:  Lumbar MRI dated 07/30/2011 and radiographs dated 08/24/2010  FINDINGS: The patient has a fairly severe lumbar scoliosis with convexity to the left centered at L3. Prior interbody and posterior fusion at L5-S1.  T12-L1:  Normal discs.  Mild left facet arthritis.  L1-2:  Normal disc.  Mild left facet arthritis.  L2-3:  Normal disc.  Normal facet joints.  L3-4: Small broad-based disc bulge with no neural impingement. Moderate right facet arthritis. No neural impingement.  L4-5: Tiny disc bulge into the left neural foramen with no neural impingement. Moderately severe right and mild left facet arthritis. No neural impingement.  L5-S1: Solid interbody fusion. No loosening of the pedicle screws. Posterior rods are intact.  IMPRESSION: 1. Fairly severe lumbar scoliosis.  This appears stable. 2. Postsurgical changes at R9-X5 with no complicating features. 3. Minimal degenerative disc disease at L3-4 and L4-5, stable. 4. Normal neural impingement.  No spinal or foraminal stenosis.   Electronically Signed   By: Rozetta Nunnery M.D.   On: 06/28/2013 12:53   Dg Hand  Complete Right  07/20/2013   CLINICAL DATA:  [place] yesterday.  Hand and wrist pain.  EXAM: RIGHT HAND - COMPLETE 3+ VIEW  COMPARISON:  None.  FINDINGS: No fracture. No dislocation. There are osteoarthritic changes involving multiple interphalangeal joints most prominent at the index finger DIP joint. Bones are demineralized. Soft tissues are unremarkable.  IMPRESSION: No acute fracture or dislocation.   Electronically Signed   By: Lajean Manes M.D.   On: 07/20/2013 15:40    Microbiology: No results found for this or any previous visit (from the past 240 hour(s)).   Labs: Basic Metabolic Panel:  Recent Labs Lab 07/19/13 2000  NA 137  K 3.6*  CL 98  CO2 26  GLUCOSE 120*  BUN 14  CREATININE 0.66  CALCIUM 9.5   Liver Function Tests: No results found for this basename: AST, ALT, ALKPHOS, BILITOT, PROT, ALBUMIN,  in the last 168 hours No results found for this basename: LIPASE, AMYLASE,  in the last 168 hours No results found for this basename: AMMONIA,  in the last 168 hours CBC:  Recent Labs Lab 07/19/13 2000  WBC 8.4  NEUTROABS 7.0  HGB 12.1  HCT 35.2*  MCV 93.1  PLT 258   Cardiac Enzymes: No results found for this basename: CKTOTAL, CKMB, CKMBINDEX, TROPONINI,  in the last 168 hours BNP: BNP (last 3 results) No  results found for this basename: PROBNP,  in the last 8760 hours CBG: No results found for this basename: GLUCAP,  in the last 168 hours  Time coordinating discharge: 45 minutes  Signed:  Sophiya Morello  [facility] 07/22/2013, 3:19 PM

## 2013-07-22 NOTE — Consult Note (Signed)
Physical Medicine and Rehabilitation Consult  Reason for Consult: Right pelvic fracture and right wrist strain.  Referring Physician: Dr. Alyson Ingles.    HPI: Toni Myers is a 63 y.o. female with history of hypertension, asthma, thoracolumbar scoliosis with chronic LBP, depression who presented to the ED after a mechanical fall when she tripped over her grandson at home and landed on her right hip. She denied sustaining any other injuries or loss of consciousness and X-ray of the pelvis showed to be right superior and inferior pubic rami--minimally displaced. Dr. Maureen Ralphs contacted and recommended WBAT. Patient admitted for pain management. Therapies initiated yesterday and patient limited by pain right hip as well as pain right hand with question of wrist strain. MD, PT, patient requesting CIR.    Review of Systems  Constitutional: Negative for fever and chills.  Eyes: Negative for double vision.  Cardiovascular: Negative for chest pain and palpitations.  Musculoskeletal: Positive for back pain and joint pain.  Neurological: Positive for sensory change and focal weakness. Negative for loss of consciousness.  Endo/Heme/Allergies: Positive for polydipsia.  Psychiatric/Behavioral: Negative for depression and suicidal ideas.    Past Medical History  Diagnosis Date  . Hypertension   . Cough   . Depression   . COPD (chronic obstructive pulmonary disease)   . RLS (restless legs syndrome)   . Chronic low back pain   . Migraine   . Palpitations   . Sjogrens syndrome   . Anxiety   . Shingles    Past Surgical History  Procedure Laterality Date  . Back surgery      07/1998 and 03/1999  . Tubal ligation     Family History  Problem Relation Age of Onset  . Arthritis Mother    Social History:  Lives with son. Per reports that she has never smoked. She has never used smokeless tobacco. Per  reports that she drinks alcohol. She reports that she does not use illicit  drugs.   Allergies  Allergen Reactions  . Morphine And Related Nausea And Vomiting    "makes me sick"    Medications Prior to Admission  Medication Sig Dispense Refill  . albuterol (PROAIR HFA) 108 (90 BASE) MCG/ACT inhaler Inhale 2 puffs into the lungs every 6 (six) hours as needed.      Marland Kitchen APAP-Isometheptene-Dichloral (EPIDRIN) 325-65-100 MG CAPS Take 1-2 capsules by mouth 2 (two) times daily as needed. For migraines      . budesonide-formoterol (SYMBICORT) 160-4.5 MCG/ACT inhaler Inhale 2 puffs into the lungs 2 (two) times daily.      Marland Kitchen BYSTOLIC 5 MG tablet Take 5 mg by mouth daily. 1 by mouth daily      . calcium citrate (CALCITRATE - DOSED IN MG ELEMENTAL CALCIUM) 950 MG tablet Take 200 mg of elemental calcium by mouth daily.      . cyclobenzaprine (FLEXERIL) 10 MG tablet Take 10 mg by mouth at bedtime as needed. For spasms      . donepezil (ARICEPT) 10 MG tablet Take 10 mg by mouth at bedtime.      Marland Kitchen escitalopram (LEXAPRO) 5 MG tablet Take 5 mg by mouth daily.      Marland Kitchen esomeprazole (NEXIUM) 40 MG capsule Take 40 mg by mouth 2 (two) times daily.      Marland Kitchen FOLIC ACID PO Take 1 tablet by mouth daily.      . methotrexate 25 MG/ML injection Inject 50 mg into the skin once a week.       Marland Kitchen  oxyCODONE-acetaminophen (PERCOCET/ROXICET) 5-325 MG per tablet Take 1 tablet by mouth every 8 (eight) hours as needed for severe pain.       Marland Kitchen PRESCRIPTION MEDICATION Place 1 drop into both eyes 4 (four) times daily. Eye drop made from pt's own blood        Home: Home Living Family/patient expects to be discharged to:: Inpatient rehab (pt requests) Living Arrangements: Children (son)  Functional History:   Functional Status:  Mobility:          ADL:    Cognition: Cognition Overall Cognitive Status: Within Functional Limits for tasks assessed Cognition Arousal/Alertness: Awake/alert Behavior During Therapy: WFL for tasks assessed/performed Overall Cognitive Status: Within Functional Limits  for tasks assessed  Blood pressure 107/69, pulse 66, temperature 98.4 F (36.9 C), temperature source Oral, resp. rate 20, height 5\' 4"  (1.626 m), weight 66.2 kg (145 lb 15.1 oz), SpO2 98.00%. Physical Exam  Constitutional: She is oriented to person, place, and time. She appears well-developed and well-nourished.  HENT:  Head: Normocephalic and atraumatic.  Eyes: EOM are normal. Pupils are equal, round, and reactive to light.  Neck: No JVD present. No tracheal deviation present. No thyromegaly present.  Cardiovascular: Normal rate and regular rhythm.   Respiratory: No respiratory distress. She has no wheezes. She has no rales.  Lymphadenopathy:    She has no cervical adenopathy.  Neurological: She is alert and oriented to person, place, and time. She has normal reflexes. No cranial nerve deficit.  UE sstrength 5/5. RLE--could not lift right leg. Knee ext 2+. ADF 4-, APF 2+, LLE grossly 3+ HF, KE, and 3 to 3+/5 at ankle. Decreased sensation over the posterior thigh to the heel on RLE.   Psychiatric: She has a normal mood and affect. Her behavior is normal. Judgment and thought content normal.    No results found for this or any previous visit (from the past 24 hour(s)). Dg Wrist Complete Right  07/20/2013   CLINICAL DATA:  [place] yesterday.  Right wrist and hand pain.  EXAM: RIGHT WRIST - COMPLETE 3+ VIEW  COMPARISON:  None.  FINDINGS: No fracture or dislocation. There are degenerative changes at the scaphoid, trapezium and trapezoid articulation. There is a well corticated bone fragment adjacent to the ulnar styloid which may be an accessory ossification center or an old fracture  The bones are diffusely demineralized. The soft tissues are unremarkable.  IMPRESSION: No acute fracture or dislocation.   Electronically Signed   By: Lajean Manes M.D.   On: 07/20/2013 15:36   Dg Hand Complete Right  07/20/2013   CLINICAL DATA:  [place] yesterday.  Hand and wrist pain.  EXAM: RIGHT HAND - COMPLETE 3+  VIEW  COMPARISON:  None.  FINDINGS: No fracture. No dislocation. There are osteoarthritic changes involving multiple interphalangeal joints most prominent at the index finger DIP joint. Bones are demineralized. Soft tissues are unremarkable.  IMPRESSION: No acute fracture or dislocation.   Electronically Signed   By: Lajean Manes M.D.   On: 07/20/2013 15:40    Assessment/Plan: Diagnosis: Right Sup-Inf- Pubic Rami fx, likely right sciatic nerve injury also with lower ext sensory loss and APF>ADF weakness noted 1. Does the need for close, 24 hr/day medical supervision in concert with the patient's rehab needs make it unreasonable for this patient to be served in a less intensive setting? Yes 2. Co-Morbidities requiring supervision/potential complications: htn, copd, depression, pain mgt 3. Due to bladder management, bowel management, safety, skin/wound care, disease management, medication administration, pain  management and patient education, does the patient require 24 hr/day rehab nursing? Yes 4. Does the patient require coordinated care of a physician, rehab nurse, PT (1-2 hrs/day, 5 days/week) and OT (1-2 hrs/day, 5 days/week) to address physical and functional deficits in the context of the above medical diagnosis(es)? Yes Addressing deficits in the following areas: balance, endurance, locomotion, strength, transferring, bowel/bladder control, bathing, dressing, feeding, grooming and toileting 5. Can the patient actively participate in an intensive therapy program of at least 3 hrs of therapy per day at least 5 days per week? Yes 6. The potential for patient to make measurable gains while on inpatient rehab is excellent 7. Anticipated functional outcomes upon discharge from inpatient rehab are mod I with PT, mod I with OT, n/a with SLP. 8. Estimated rehab length of stay to reach the above functional goals is: 9-12 days 9. Does the patient have adequate social supports to accommodate these discharge  functional goals? Yes 10. Anticipated D/C setting: Home 11. Anticipated post D/C treatments: [place] therapy 12. Overall Rehab/Functional Prognosis: excellent  RECOMMENDATIONS: This patient's condition is appropriate for continued rehabilitative care in the following setting: CIR Patient has agreed to participate in recommended program. Yes Note that insurance prior authorization may be required for reimbursement for recommended care.  Comment: Rehab Admissions Coordinator to follow up.  Thanks,  Meredith Staggers, MD, Mellody Drown     07/22/2013

## 2013-07-22 NOTE — Progress Notes (Signed)
Rehab admissions - Evaluated for possible admission.  I spoke with the on site insurance reviewer for [place].  Based on current diagnosis, we cannot get authorization for acute inpatient rehab admission.  Options will be SNF or HH therapies for follow up.  Call me for questions.  #[phone]

## 2013-07-23 LAB — COMPREHENSIVE METABOLIC PANEL
ALBUMIN: 3.4 g/dL — AB (ref 3.5–5.2)
ALT: 17 U/L (ref 0–35)
AST: 28 U/L (ref 0–37)
Alkaline Phosphatase: 90 U/L (ref 39–117)
BUN: 17 mg/dL (ref 6–23)
CO2: 24 mEq/L (ref 19–32)
Calcium: 9.5 mg/dL (ref 8.4–10.5)
Chloride: 100 mEq/L (ref 96–112)
Creatinine, Ser: 0.51 mg/dL (ref 0.50–1.10)
GFR calc Af Amer: >90 mL/min (ref >90)
GFR calc non Af Amer: >90 mL/min (ref >90)
Glucose, Bld: 103 mg/dL — ABNORMAL HIGH (ref 70–99)
POTASSIUM: 4.3 meq/L (ref 3.7–5.3)
SODIUM: 138 meq/L (ref 137–147)
TOTAL PROTEIN: 7.1 g/dL (ref 6.0–8.3)
Total Bilirubin: 0.7 mg/dL (ref 0.3–1.2)

## 2013-07-23 NOTE — Progress Notes (Addendum)
Patient seen and examined.  No complaints, hand movement improved.  Exam stable.  Right hand with loose fist.  Right leg still limited by pain.  Several toes with erythema/darkening at tips, feet overall warm and well-perfused, 2+ pulses.  Stable for discharge to SNF.  Asked that she speak to her primary care doctor about her toes >> may need ABIs, lower extremity duplex.  No family hx or personal history of vascular disease and pulses 2+.  Continue fungal powder and antibiotics.

## 2013-07-23 NOTE — Progress Notes (Signed)
   CARE MANAGEMENT NOTE 07/23/2013  Patient:  Toni Myers, Toni Myers   Account Number:  [id number]  Date Initiated:  07/21/2013  Documentation initiated by:  DAVIS,TYMEEKA  Subjective/Objective Assessment:   63 yo female admitted with  Closed fracture of multiple pubic rami     Action/Plan:   Anticipated DC Date:  07/23/2013   Anticipated DC Plan:  SKILLED NURSING FACILITY  In-house referral  Clinical Social Worker      DC Planning Services  CM consult      Choice offered to / List presented to:             Status of service:  Completed, signed off Medicare Important Message given?   (If response is "NO", the following Medicare IM given date fields will be blank) Date Medicare IM given:   Date Additional Medicare IM given:    Discharge Disposition:  [facility]  Per UR Regulation:  Reviewed for med. necessity/level of care/duration of stay  If discussed at [place] of Stay Meetings, dates discussed:    Comments:  07/23/2013 1537 No NCM needs identified. Jonnie Finner RN CCM Case Mgmt phone [phone]  07/21/13 [place] [phone] UR complete

## 2013-07-24 ENCOUNTER — Encounter: Payer: Self-pay | Admitting: *Deleted

## 2013-07-24 ENCOUNTER — Encounter: Payer: Self-pay | Admitting: Adult Health

## 2013-07-24 ENCOUNTER — Other Ambulatory Visit: Payer: Self-pay | Admitting: *Deleted

## 2013-07-24 MED ORDER — EPIDRIN 325-65-100 MG PO CAPS: ORAL_CAPSULE | ORAL | Status: DC

## 2013-07-24 NOTE — Telephone Encounter (Signed)
[facility] 

## 2013-07-24 NOTE — Progress Notes (Signed)
Clinical Social Work Department CLINICAL SOCIAL WORK PLACEMENT NOTE 07/24/2013  Patient:  Toni Myers, Toni Myers  Account Number:  [id number] Admit date:  07/19/2013  Clinical Social Worker:  Levie Heritage  Date/time:  07/21/2013 03:02 PM  Clinical Social Work is seeking post-discharge placement for this patient at the following level of care:   SKILLED NURSING   (*CSW will update this form in Epic as items are completed)   07/21/2013  Patient/family provided with [facility] Department of Clinical Social Work's list of facilities offering this level of care within the geographic area requested by the patient (or if unable, by the patient's family).  07/21/2013  Patient/family informed of their freedom to choose among providers that offer the needed level of care, that participate in Medicare, Medicaid or managed care program needed by the patient, have an available bed and are willing to accept the patient.  07/21/2013  Patient/family informed of [facility]' ownership interest in [facility], as well as of the fact that they are under no obligation to receive care at this facility.  PASARR submitted to EDS on 07/21/2013 PASARR number received from EDS on 07/21/2013  FL2 transmitted to all facilities in geographic area requested by pt/family on  07/21/2013 FL2 transmitted to all facilities within larger geographic area on   Patient informed that his/her managed care company has contracts with or will negotiate with  certain facilities, including the following:     Patient/family informed of bed offers received:  07/22/2013 Patient chooses bed at [facility] Physician recommends and patient chooses bed at    Patient to be transferred to [facility] on  07/23/2013 Patient to be transferred to facility by [facility]  The following physician request were entered in Epic:   Additional Comments:  Werner Lean LCSW [phone]

## 2013-07-27 ENCOUNTER — Other Ambulatory Visit: Payer: Medicare Other

## 2013-08-01 ENCOUNTER — Non-Acute Institutional Stay (SKILLED_NURSING_FACILITY): Payer: Medicare Other | Admitting: Internal Medicine

## 2013-08-01 ENCOUNTER — Encounter: Payer: Self-pay | Admitting: Internal Medicine

## 2013-08-01 DIAGNOSIS — S32599A Other specified fracture of unspecified pubis, initial encounter for closed fracture: Secondary | ICD-10-CM

## 2013-08-01 DIAGNOSIS — F3289 Other specified depressive episodes: Secondary | ICD-10-CM

## 2013-08-01 DIAGNOSIS — J4489 Other specified chronic obstructive pulmonary disease: Secondary | ICD-10-CM

## 2013-08-01 DIAGNOSIS — H2012 Chronic iridocyclitis, left eye: Secondary | ICD-10-CM | POA: Insufficient documentation

## 2013-08-01 DIAGNOSIS — S32509A Unspecified fracture of unspecified pubis, initial encounter for closed fracture: Secondary | ICD-10-CM

## 2013-08-01 DIAGNOSIS — F0390 Unspecified dementia without behavioral disturbance: Secondary | ICD-10-CM

## 2013-08-01 DIAGNOSIS — H209 Unspecified iridocyclitis: Secondary | ICD-10-CM

## 2013-08-01 DIAGNOSIS — F32A Depression, unspecified: Secondary | ICD-10-CM

## 2013-08-01 DIAGNOSIS — K219 Gastro-esophageal reflux disease without esophagitis: Secondary | ICD-10-CM

## 2013-08-01 DIAGNOSIS — M069 Rheumatoid arthritis, unspecified: Secondary | ICD-10-CM

## 2013-08-01 DIAGNOSIS — I10 Essential (primary) hypertension: Secondary | ICD-10-CM

## 2013-08-01 DIAGNOSIS — J449 Chronic obstructive pulmonary disease, unspecified: Secondary | ICD-10-CM

## 2013-08-01 DIAGNOSIS — F329 Major depressive disorder, single episode, unspecified: Secondary | ICD-10-CM

## 2013-08-01 NOTE — Assessment & Plan Note (Signed)
With an acute exacerbation in hospital tx with nebs and short prednisone taper and doxycycline

## 2013-08-01 NOTE — Assessment & Plan Note (Signed)
See above;methotrexate weekly

## 2013-08-01 NOTE — Progress Notes (Signed)
MRN: [id number] Name: Toni Myers  Sex: female Age: 63 y.o. DOB: [birth year]  [place] #: [facility] Facility/Room: 509 Level Of Care: SNF Provider: Inocencio Homes D Emergency Contacts: Extended Emergency Contact Information Primary Emergency Contact: Reed,Chris Address: [address]          [place], [place] [address] [place] Phone: [phone] Relation: Son Secondary Emergency Contact: Cain,Michelle Address: [place], [place] Phone: [phone] Relation: Daughter  Code Status: FULL  Allergies: Morphine and related  Chief Complaint  Patient presents with  . nursing home admission    HPI: Patient is 63 y.o. female who had a mechanical fall, fx r inf and sup pubic rami , resulting in R leg problems. Pt here for OT/PT.  Past Medical History  Diagnosis Date  . Hypertension   . Cough   . Depression   . COPD (chronic obstructive pulmonary disease)   . RLS (restless legs syndrome)   . Chronic low back pain   . Migraine   . Palpitations   . Sjogrens syndrome   . Anxiety   . Shingles   . Arthritis     Rheumatoid  . Asthma   . Chronic iritis, left eye   . Dementia without behavioral disturbance   . GERD (gastroesophageal reflux disease)     Past Surgical History  Procedure Laterality Date  . Back surgery      07/1998 and 03/1999  . Tubal ligation        Medication List       This list is accurate as of: 08/01/13 12:27 PM.  Always use your most recent med list.               budesonide-formoterol 160-4.5 MCG/ACT inhaler  Commonly known as:  SYMBICORT  Inhale 2 puffs into the lungs 2 (two) times daily.     BYSTOLIC 5 MG tablet  Generic drug:  nebivolol  Take 5 mg by mouth daily. 1 by mouth daily     calcium citrate 950 MG tablet  Commonly known as:  CALCITRATE - dosed in mg elemental calcium  Take 200 mg of elemental calcium by mouth daily.     cyclobenzaprine 10 MG  tablet  Commonly known as:  FLEXERIL  Take 1 tablet (10 mg total) by mouth at bedtime as needed. For spasms     donepezil 10 MG tablet  Commonly known as:  ARICEPT  Take 10 mg by mouth at bedtime.     doxycycline 100 MG tablet  Commonly known as:  VIBRA-TABS  Take 1 tablet (100 mg total) by mouth every 12 (twelve) hours.     DSS 100 MG Caps  Take 100 mg by mouth daily as needed for mild constipation.     EPIDRIN 325-65-100 MG Caps  Take one capsule by mouth twice daily as needed for mild migraine; Take two capsules by mouth twice daily as needed for severe migraine     escitalopram 5 MG tablet  Commonly known as:  LEXAPRO  Take 5 mg by mouth daily.     esomeprazole 40 MG capsule  Commonly known as:  NEXIUM  Take 40 mg by mouth 2 (two) times daily.     FOLIC ACID PO  Take 1 tablet by mouth daily.     methocarbamol 500 MG tablet  Commonly known as:  ROBAXIN  Take 1 tablet (500 mg  total) by mouth every 6 (six) hours as needed for muscle spasms.     methotrexate 25 MG/ML injection  Inject 0.5 mLs (12.5 mg total) into the skin once a week.     oxyCODONE-acetaminophen 5-325 MG per tablet  Commonly known as:  PERCOCET/ROXICET  Take 1-2 tablets by mouth every 6 (six) hours as needed for severe pain.     polyethylene glycol packet  Commonly known as:  MIRALAX / GLYCOLAX  Take 17 g by mouth 2 (two) times daily as needed for moderate constipation.     predniSONE 10 MG tablet  Commonly known as:  DELTASONE  Take 5 tabs x 1 day, 4 tabs x 1 day, 3 tabs x 1 day, 2 tabs x 1 day, 1 tab x 1 day, then stop.     PRESCRIPTION MEDICATION  Place 1 drop into both eyes 4 (four) times daily. Eye drop made from pt's own blood     PROAIR HFA 108 (90 BASE) MCG/ACT inhaler  Generic drug:  albuterol  Inhale 2 puffs into the lungs every 6 (six) hours as needed.        No orders of the defined types were placed in this encounter.    Immunization History  Administered Date(s) Administered   . Influenza Split 02/27/2012  . Influenza Whole 02/21/2011  . Pneumococcal-Unspecified 06/29/2009    History  Substance Use Topics  . Smoking status: Never Smoker   . Smokeless tobacco: Never Used  . Alcohol Use: Yes     Comment: 2 glasses monthly    Family history is noncontributory    Review of Systems  DATA OBTAINED: from patient GENERAL: Feels well no fevers, fatigue, appetite changes SKIN: No itching, rash or wounds EYES: No eye pain, redness, discharge EARS: No earache, tinnitus, change in hearing NOSE: No congestion, drainage or bleeding  MOUTH/THROAT: No mouth or tooth pain, No sore throat, No difficulty chewing or swallowing  RESPIRATORY: No cough, wheezing, SOB CARDIAC: No chest pain, palpitations, lower extremity edema  GI: No abdominal pain, No N/V/D or constipation, No heartburn or reflux  GU: No dysuria, frequency or urgency, or incontinence  MUSCULOSKELETAL: No unrelieved bone/joint pain NEUROLOGIC: No headache, dizziness; R leg weak since fall and c/o muscles spasms and a burning pain in it PSYCHIATRIC: No overt anxiety or sadness. Sleeps well. No behavior issue.   Filed Vitals:   08/01/13 1203  BP: 113/74  Pulse: 72  Temp: 98 F (36.7 C)  Resp: 20    Physical Exam  GENERAL APPEARANCE: Alert, conversant. Appropriately groomed. No acute distress.  SKIN: No diaphoresis rash, or wounds HEAD: Normocephalic, atraumatic  EYES: Conjunctiva/lids clear. Pupils round, reactive. EOMs intact.  EARS: External exam WNL, canals clear. Hearing grossly normal.  NOSE: No deformity or discharge.  MOUTH/THROAT: Lips w/o lesions.   RESPIRATORY: Breathing is even, unlabored. Lung sounds are clear   CARDIOVASCULAR: Heart RRR no murmurs, rubs or gallops. No peripheral edema.  GASTROINTESTINAL: Abdomen is soft, non-tender, not distended w/ normal bowel sounds GENITOURINARY: Bladder non tender, not distended  MUSCULOSKELETAL: No abnormal joints or  musculature NEUROLOGIC: Oriented X3. Cranial nerves 2-12 grossly intact;RLE weak PSYCHIATRIC: Mood and affect appropriate to situation, no behavioral issues  Patient Active Problem List   Diagnosis Date Noted  . Dementia without behavioral disturbance   . GERD (gastroesophageal reflux disease)   . Strain of tendons of finger of right hand 07/22/2013  . COPD with acute exacerbation 07/22/2013  . Uveitis 07/22/2013  . Rheumatoid arthritis  07/22/2013  . Closed fracture of multiple pubic rami 07/19/2013  . Closed fracture of pelvic rim 07/19/2013  . Syncope 10/09/2011  . Pulmonary infiltrate 10/09/2011  . Depression 08/31/2011  . COPD (chronic obstructive pulmonary disease) 08/31/2011  . RLS (restless legs syndrome) 08/31/2011  . Palpitations 08/31/2011  . Sjogren's disease 08/31/2011  . HYPERTENSION 08/06/2007  . Cough 08/06/2007    CBC    Component Value Date/Time   WBC 8.4 07/19/2013 2000   RBC 3.78* 07/19/2013 2000   HGB 12.1 07/19/2013 2000   HCT 35.2* 07/19/2013 2000   PLT 258 07/19/2013 2000   MCV 93.1 07/19/2013 2000   LYMPHSABS 0.9 07/19/2013 2000   MONOABS 0.4 07/19/2013 2000   EOSABS 0.1 07/19/2013 2000   BASOSABS 0.0 07/19/2013 2000    CMP     Component Value Date/Time   NA 138 07/23/2013 0450   K 4.3 07/23/2013 0450   CL 100 07/23/2013 0450   CO2 24 07/23/2013 0450   GLUCOSE 103* 07/23/2013 0450   BUN 17 07/23/2013 0450   CREATININE 0.51 07/23/2013 0450   CALCIUM 9.5 07/23/2013 0450   PROT 7.1 07/23/2013 0450   ALBUMIN 3.4* 07/23/2013 0450   AST 28 07/23/2013 0450   ALT 17 07/23/2013 0450   ALKPHOS 90 07/23/2013 0450   BILITOT 0.7 07/23/2013 0450   GFRNONAA >90 07/23/2013 0450   GFRAA >90 07/23/2013 0450    Assessment and Plan  Closed fracture of multiple pubic rami R superior and inferior pubic rami with numbness in R leg and difficulty using R leg when standing; Pain meds. Robaxin q4 scheduled. Was q6 prn but pt is having so much trouble with spasms that it was made q4 prn and she  just now asked me if we could schedule is so I will change it to q4 scheduled. Also pt c/o shooting pain in R leg,will start neurontin 300 mg TID  HYPERTENSION Controlled on bystolic  COPD (chronic obstructive pulmonary disease) With an acute exacerbation in hospital tx with nebs and short prednisone taper and doxycycline  Dementia without behavioral disturbance Mild -continue aricept  Rheumatoid arthritis with chronic uveitis;continue methotrexate weekly with weekly LFT's and CBC  Uveitis See above;methotrexate weekly  Depression Continue lexapro  GERD (gastroesophageal reflux disease) Was d/c on nexium which has been changed to omeprazole 20 mg daily    Hennie Duos, MD

## 2013-08-01 NOTE — Assessment & Plan Note (Signed)
R superior and inferior pubic rami with numbness in R leg and difficulty using R leg when standing; Pain meds. Robaxin q4 scheduled. Was q6 prn but pt is having so much trouble with spasms that it was made q4 prn and she just now asked me if we could schedule is so I will change it to q4 scheduled. Also pt c/o shooting pain in R leg,will start neurontin 300 mg TID

## 2013-08-01 NOTE — Assessment & Plan Note (Signed)
Controlled on bystolic

## 2013-08-01 NOTE — Assessment & Plan Note (Signed)
Mild -continue aricept

## 2013-08-01 NOTE — Assessment & Plan Note (Addendum)
with chronic uveitis;continue methotrexate weekly with weekly LFT's and CBC sent to [facility] ophthalmology

## 2013-08-01 NOTE — Assessment & Plan Note (Signed)
Was d/c on nexium which has been changed to omeprazole 20 mg daily

## 2013-08-01 NOTE — Assessment & Plan Note (Signed)
Continue lexapro  ?

## 2013-09-05 ENCOUNTER — Encounter: Payer: Self-pay | Admitting: Adult Health

## 2013-09-05 ENCOUNTER — Non-Acute Institutional Stay (SKILLED_NURSING_FACILITY): Payer: Medicare Other | Admitting: Adult Health

## 2013-09-05 DIAGNOSIS — I10 Essential (primary) hypertension: Secondary | ICD-10-CM

## 2013-09-05 DIAGNOSIS — S32509A Unspecified fracture of unspecified pubis, initial encounter for closed fracture: Secondary | ICD-10-CM

## 2013-09-05 DIAGNOSIS — G2581 Restless legs syndrome: Secondary | ICD-10-CM

## 2013-09-05 DIAGNOSIS — M069 Rheumatoid arthritis, unspecified: Secondary | ICD-10-CM

## 2013-09-05 DIAGNOSIS — F0390 Unspecified dementia without behavioral disturbance: Secondary | ICD-10-CM

## 2013-09-05 DIAGNOSIS — F3289 Other specified depressive episodes: Secondary | ICD-10-CM

## 2013-09-05 DIAGNOSIS — F32A Depression, unspecified: Secondary | ICD-10-CM

## 2013-09-05 DIAGNOSIS — S32599A Other specified fracture of unspecified pubis, initial encounter for closed fracture: Secondary | ICD-10-CM

## 2013-09-05 DIAGNOSIS — J449 Chronic obstructive pulmonary disease, unspecified: Secondary | ICD-10-CM

## 2013-09-05 DIAGNOSIS — H209 Unspecified iridocyclitis: Secondary | ICD-10-CM

## 2013-09-05 DIAGNOSIS — F329 Major depressive disorder, single episode, unspecified: Secondary | ICD-10-CM

## 2013-09-05 NOTE — Progress Notes (Signed)
This encounter was created in error - please disregard.

## 2013-09-05 NOTE — Progress Notes (Signed)
Patient ID: Toni Myers, female   DOB: [birth year], 63 y.o.   MRN: [id number]              PROGRESS NOTE  DATE: 09/05/2013   FACILITY: [facility]  LEVEL OF CARE: SNF (31)  Routine Visit  CHIEF COMPLAINT:  Manage Hypertension, Depression, COPD and Dementia  HISTORY OF PRESENT ILLNESS:   Reassessment of ongoing problem(s):  HTN: Pt 's HTN remains stable.  Denies CP, sob, DOE, pedal edema, headaches, dizziness or visual disturbances.  No complications from the medications currently being used.  Last BP : 116/65  DEPRESSION: The depression remains stable. Patient denies ongoing feelings of sadness, insomnia, anedhonia or lack of appetite. No complications reported from the medications currently being used. Staff do not report behavioral problems.  COPD: the COPD remains stable.  Pt denies sob, cough, wheezing or declining exercise tolerance.  No complications from the medications presently being used.   PAST MEDICAL HISTORY : Reviewed.  No changes.  CURRENT MEDICATIONS: Reviewed per [facility]  REVIEW OF SYSTEMS:  GENERAL: no change in appetite, no fatigue, no weight changes, no fever, chills or weakness RESPIRATORY: no cough, SOB, DOE, wheezing, hemoptysis CARDIAC: no chest pain, edema or palpitations GI: no abdominal pain, diarrhea, constipation, heart burn, nausea or vomiting  PHYSICAL EXAMINATION  GENERAL: no acute distress, normal body habitus EYES: conjunctivae normal, sclerae normal, normal eye lids NECK: supple, trachea midline, no neck masses, no thyroid tenderness, no thyromegaly LYMPHATICS: no LAN in the neck, no supraclavicular LAN RESPIRATORY: breathing is even & unlabored, BS CTAB CARDIAC: RRR, no murmur,no extra heart sounds, no edema GI: abdomen soft, normal BS, no masses, no tenderness, no hepatomegaly, no splenomegaly EXTREMITIES: able to ambulate with a cane PSYCHIATRIC: the patient is alert & oriented to person, affect & behavior  appropriate  LABS/RADIOLOGY: Labs reviewed: Basic Metabolic Panel:  Recent Labs  07/19/13 2000 07/23/13 0450  NA 137 138  K 3.6* 4.3  CL 98 100  CO2 26 24  GLUCOSE 120* 103*  BUN 14 17  CREATININE 0.66 0.51  CALCIUM 9.5 9.5   Liver Function Tests:  Recent Labs  07/23/13 0450  AST 28  ALT 17  ALKPHOS 90  BILITOT 0.7  PROT 7.1  ALBUMIN 3.4*   CBC:  Recent Labs  07/19/13 2000  WBC 8.4  NEUTROABS 7.0  HGB 12.1  HCT 35.2*  MCV 93.1  PLT 258    ASSESSMENT/PLAN:  Closed fracture of multiple pubic rami - non surgical management with pain control; continue rehabilitation Hypertension - well controlled; continue Nebivolol  Depression - continue Lexapro COPD - stable; continue Symbicort Dementia - continue Aricept Uveitis - continue autologous eye gtts Rheumatoid arthritis - continue methotrexate Restless leg syndrome - continue Neurontin   CPT CODE: 21308   Monina Vargas - NP [place] [phone]

## 2013-09-09 ENCOUNTER — Non-Acute Institutional Stay (SKILLED_NURSING_FACILITY): Payer: Medicare Other | Admitting: Adult Health

## 2013-09-09 ENCOUNTER — Encounter: Payer: Self-pay | Admitting: Adult Health

## 2013-09-09 DIAGNOSIS — S32509A Unspecified fracture of unspecified pubis, initial encounter for closed fracture: Secondary | ICD-10-CM

## 2013-09-09 DIAGNOSIS — H209 Unspecified iridocyclitis: Secondary | ICD-10-CM

## 2013-09-09 DIAGNOSIS — F0390 Unspecified dementia without behavioral disturbance: Secondary | ICD-10-CM

## 2013-09-09 DIAGNOSIS — J449 Chronic obstructive pulmonary disease, unspecified: Secondary | ICD-10-CM

## 2013-09-09 DIAGNOSIS — F3289 Other specified depressive episodes: Secondary | ICD-10-CM

## 2013-09-09 DIAGNOSIS — G2581 Restless legs syndrome: Secondary | ICD-10-CM

## 2013-09-09 DIAGNOSIS — M069 Rheumatoid arthritis, unspecified: Secondary | ICD-10-CM

## 2013-09-09 DIAGNOSIS — F32A Depression, unspecified: Secondary | ICD-10-CM

## 2013-09-09 DIAGNOSIS — F329 Major depressive disorder, single episode, unspecified: Secondary | ICD-10-CM

## 2013-09-09 DIAGNOSIS — S32599A Other specified fracture of unspecified pubis, initial encounter for closed fracture: Secondary | ICD-10-CM

## 2013-09-09 DIAGNOSIS — J329 Chronic sinusitis, unspecified: Secondary | ICD-10-CM

## 2013-09-09 DIAGNOSIS — I10 Essential (primary) hypertension: Secondary | ICD-10-CM

## 2013-09-09 NOTE — Progress Notes (Signed)
Patient ID: SYRIAH DELISI, female   DOB: 03/20/1951, 63 y.o.   MRN: [id number]                PROGRESS NOTE  DATE:  09/09/13   FACILITY: [facility]  LEVEL OF CARE: SNF (31)  Acute Visist  CHIEF COMPLAINT:  Discharge Notes  HISTORY OF PRESENT ILLNESS: This is a 63 year old female who is for discharge home with Home health PT. She is currently complaining of maxillary sinus tenderness and nasal congestion. She has been admitted to [facility] on 07/23/13 from [facility]. She fell at home sustaining a Closed fracture of multiple pubic rami. Patient was admitted to this facility for short-term rehabilitation after the patient's recent hospitalization.  Patient has completed SNF rehabilitation and therapy has cleared the patient for discharge.  Reassessment of ongoing problem(s):  HTN: Pt 's HTN remains stable.  Denies CP, sob, DOE, pedal edema, headaches, dizziness or visual disturbances.  No complications from the medications currently being used.  Last BP : 123/78   DEMENTIA: The dementia remaines stable and continues to function adequately in the current living environment with supervision.  The patient has had little changes in behavior. No complications noted from the medications presently being used.  COPD: the COPD remains stable.  Pt denies sob, cough, wheezing or declining exercise tolerance.  No complications from the medications presently being used.   PAST MEDICAL HISTORY : Reviewed.  No changes.  CURRENT MEDICATIONS: Reviewed per [facility]  REVIEW OF SYSTEMS:  GENERAL: no change in appetite, no fatigue, no weight changes, no fever, chills or weakness NOSE: +nasal congestion RESPIRATORY: no cough, SOB, DOE, wheezing, hemoptysis CARDIAC: no chest pain, edema or palpitations GI: no abdominal pain, diarrhea, constipation, heart burn, nausea or vomiting  PHYSICAL EXAMINATION  GENERAL: no acute distress, normal body habitus EYES: conjunctivae normal,  sclerae normal, normal eye lids NOSE: nasal erythema noted NECK: supple, trachea midline, no neck masses, no thyroid tenderness, no thyromegaly RESPIRATORY: breathing is even & unlabored, BS CTAB CARDIAC: RRR, no murmur,no extra heart sounds, no edema GI: abdomen soft, normal BS, no masses, no tenderness, no hepatomegaly, no splenomegaly EXTREMITIES: able to ambulate with a cane PSYCHIATRIC: the patient is alert & oriented to person, affect & behavior appropriate  LABS/RADIOLOGY: Labs reviewed: Basic Metabolic Panel:  Recent Labs  07/19/13 2000 07/23/13 0450  NA 137 138  K 3.6* 4.3  CL 98 100  CO2 26 24  GLUCOSE 120* 103*  BUN 14 17  CREATININE 0.66 0.51  CALCIUM 9.5 9.5   Liver Function Tests:  Recent Labs  07/23/13 0450  AST 28  ALT 17  ALKPHOS 90  BILITOT 0.7  PROT 7.1  ALBUMIN 3.4*   CBC:  Recent Labs  07/19/13 2000  WBC 8.4  NEUTROABS 7.0  HGB 12.1  HCT 35.2*  MCV 93.1  PLT 258    ASSESSMENT/PLAN:  Closed fracture of multiple pubic rami - non surgical management with pain control; continue rehabilitation Hypertension - well controlled; continue Nebivolol  Depression - continue Lexapro COPD - stable; continue Symbicort Dementia - continue Aricept Uveitis - continue autologous eye gtts Rheumatoid arthritis - continue methotrexate Restless leg syndrome - continue Neurontin Rhinosinusitis -  Start Augment 500/125 mg  1 tab PO Q 8 hours x 7 days   I have filled out patient's discharge paperwork and written prescriptions.  Patient will receive home health PT.   Total discharge time: Greater than 30 minutes Discharge time  involved coordination of the discharge process with Education officer, museum, nursing staff and therapy department. Medical justification for home health services verified.  CPT CODE: 94174   Seth Bake - NP [facility] [phone]

## 2013-11-08 ENCOUNTER — Other Ambulatory Visit: Payer: Self-pay | Admitting: Adult Health

## 2013-12-05 ENCOUNTER — Encounter: Payer: Medicare Other | Admitting: Neurology

## 2013-12-23 ENCOUNTER — Ambulatory Visit (INDEPENDENT_AMBULATORY_CARE_PROVIDER_SITE_OTHER): Payer: Self-pay

## 2013-12-23 ENCOUNTER — Ambulatory Visit (INDEPENDENT_AMBULATORY_CARE_PROVIDER_SITE_OTHER): Payer: Medicare Other | Admitting: Neurology

## 2013-12-23 DIAGNOSIS — R209 Unspecified disturbances of skin sensation: Secondary | ICD-10-CM

## 2013-12-23 DIAGNOSIS — G544 Lumbosacral root disorders, not elsewhere classified: Secondary | ICD-10-CM

## 2013-12-23 DIAGNOSIS — Z0289 Encounter for other administrative examinations: Secondary | ICD-10-CM

## 2013-12-23 NOTE — Procedures (Signed)
     HISTORY:  Toni Myers is a 63 year old patient with a history of prior lumbosacral spine surgery on 2 occasions, with the last surgery done 15 years ago. She reports no further pain down the right leg, but she has had chronic issues with a footdrop on the right following surgery, and some problems with numbness in the anterolateral aspect of the right leg down into the foot. She believes that there is some worsening of strength of the right leg, and worsening of balance. She notes some cramping in the muscles in both legs, right greater than left. No weakness or numbness of the left leg is noted. The patient is being evaluated for possible neuropathy or a lumbosacral radiculopathy.  NERVE CONDUCTION STUDIES:  Nerve conduction studies were performed on both lower extremities. The distal motor latencies and motor amplitudes for the peroneal and posterior tibial nerves were within normal limits, with exception that the motor amplitude for the right peroneal nerve was low. The nerve conduction velocities for these nerves were also normal. The H reflex latencies normal. The sensory latencies for the peroneal nerves were within normal limits.   EMG STUDIES:  EMG study was performed on the right lower extremity:  The tibialis anterior muscle reveals 2 to 5K motor units with decreased recruitment. No fibrillations or positive waves were seen. The peroneus tertius muscle reveals 2 to 3K motor units with decreased recruitment. No fibrillations or positive waves were seen. The medial gastrocnemius muscle reveals up to 6K motor units with decreased recruitment. 2+ positive waves were seen. The vastus lateralis muscle reveals 2 to 4K motor units with full recruitment. No fibrillations or positive waves were seen. The iliopsoas muscle reveals 2 to 4K motor units with full recruitment. No fibrillations or positive waves were seen. The biceps femoris muscle (long head) reveals 2 to 4K motor units with full  recruitment. No fibrillations or positive waves were seen. The lumbosacral paraspinal muscles were tested at 3 levels, and revealed 1+ positive waves at the upper and middle levels tested. 2+ fibrillations and positive waves were seen at the lower level. There was good relaxation.   IMPRESSION:  Nerve conduction studies done on both lower extremities were relatively unremarkable with exception that the motor amplitude for the right peroneal nerve was low. This finding correlates with EMG evaluation of the right leg showing evidence of a chronic stable L5 radiculopathy. There appears to be evidence of a mild acute S1 radiculopathy on the right as well.  Jill Alexanders MD 12/23/2013 10:43 AM  [facility] [address] [place], [place] [address]  Phone [phone] Fax [phone]

## 2014-01-14 ENCOUNTER — Inpatient Hospital Stay: Admission: RE | Admit: 2014-01-14 | Payer: Medicare Other | Source: Ambulatory Visit

## 2014-01-14 ENCOUNTER — Other Ambulatory Visit: Payer: Self-pay | Admitting: Family Medicine

## 2014-01-14 DIAGNOSIS — R609 Edema, unspecified: Secondary | ICD-10-CM

## 2014-01-15 ENCOUNTER — Ambulatory Visit
Admission: RE | Admit: 2014-01-15 | Discharge: 2014-01-15 | Disposition: A | Discharge status: Home / Self Care | Payer: Medicare Other | Source: Ambulatory Visit | Attending: Family Medicine | Admitting: Family Medicine

## 2014-01-15 ENCOUNTER — Other Ambulatory Visit: Payer: Medicare Other

## 2014-01-15 DIAGNOSIS — R609 Edema, unspecified: Secondary | ICD-10-CM

## 2014-02-03 ENCOUNTER — Other Ambulatory Visit: Payer: Self-pay | Admitting: Neurosurgery

## 2014-02-03 DIAGNOSIS — M4120 Other idiopathic scoliosis, site unspecified: Secondary | ICD-10-CM

## 2014-02-08 ENCOUNTER — Ambulatory Visit
Admission: RE | Admit: 2014-02-08 | Discharge: 2014-02-08 | Disposition: A | Discharge status: Home / Self Care | Payer: Medicare Other | Source: Ambulatory Visit | Attending: Neurosurgery | Admitting: Neurosurgery

## 2014-02-08 DIAGNOSIS — M4120 Other idiopathic scoliosis, site unspecified: Secondary | ICD-10-CM

## 2014-04-09 ENCOUNTER — Encounter: Payer: Self-pay | Admitting: Neurology

## 2014-04-15 ENCOUNTER — Encounter: Payer: Self-pay | Admitting: Neurology

## 2014-06-10 DIAGNOSIS — Z79899 Other long term (current) drug therapy: Secondary | ICD-10-CM | POA: Diagnosis not present

## 2014-06-19 DIAGNOSIS — B009 Herpesviral infection, unspecified: Secondary | ICD-10-CM | POA: Diagnosis not present

## 2014-06-19 DIAGNOSIS — T691XXA Chilblains, initial encounter: Secondary | ICD-10-CM | POA: Diagnosis not present

## 2014-07-07 ENCOUNTER — Other Ambulatory Visit: Payer: Self-pay | Admitting: Adult Health

## 2014-07-16 DIAGNOSIS — J209 Acute bronchitis, unspecified: Secondary | ICD-10-CM | POA: Diagnosis not present

## 2014-07-21 ENCOUNTER — Other Ambulatory Visit: Payer: Self-pay

## 2014-07-21 DIAGNOSIS — Z1231 Encounter for screening mammogram for malignant neoplasm of breast: Secondary | ICD-10-CM

## 2014-07-31 ENCOUNTER — Other Ambulatory Visit: Payer: Self-pay

## 2014-07-31 ENCOUNTER — Ambulatory Visit
Admission: RE | Admit: 2014-07-31 | Discharge: 2014-07-31 | Disposition: A | Discharge status: Home / Self Care | Payer: Medicare Other | Source: Ambulatory Visit

## 2014-07-31 DIAGNOSIS — Z1231 Encounter for screening mammogram for malignant neoplasm of breast: Secondary | ICD-10-CM | POA: Diagnosis not present

## 2014-07-31 DIAGNOSIS — T691XXD Chilblains, subsequent encounter: Secondary | ICD-10-CM | POA: Diagnosis not present

## 2014-08-01 DIAGNOSIS — J441 Chronic obstructive pulmonary disease with (acute) exacerbation: Secondary | ICD-10-CM | POA: Diagnosis not present

## 2014-08-04 ENCOUNTER — Ambulatory Visit (INDEPENDENT_AMBULATORY_CARE_PROVIDER_SITE_OTHER): Payer: Medicare Other | Admitting: Adult Health

## 2014-08-04 ENCOUNTER — Other Ambulatory Visit: Payer: Self-pay | Admitting: Nurse Practitioner

## 2014-08-04 ENCOUNTER — Other Ambulatory Visit (INDEPENDENT_AMBULATORY_CARE_PROVIDER_SITE_OTHER): Payer: Medicare Other

## 2014-08-04 ENCOUNTER — Ambulatory Visit
Admission: RE | Admit: 2014-08-04 | Discharge: 2014-08-04 | Disposition: A | Discharge status: Home / Self Care | Payer: Medicare Other | Source: Ambulatory Visit | Attending: Nurse Practitioner | Admitting: Nurse Practitioner

## 2014-08-04 ENCOUNTER — Encounter: Payer: Self-pay | Admitting: Adult Health

## 2014-08-04 VITALS — BP 124/74 | HR 86 | Temp 98.4°F | Ht 64.0 in | Wt 139.0 lb

## 2014-08-04 DIAGNOSIS — R0600 Dyspnea, unspecified: Secondary | ICD-10-CM

## 2014-08-04 DIAGNOSIS — R05 Cough: Secondary | ICD-10-CM

## 2014-08-04 DIAGNOSIS — R059 Cough, unspecified: Secondary | ICD-10-CM

## 2014-08-04 DIAGNOSIS — J45901 Unspecified asthma with (acute) exacerbation: Secondary | ICD-10-CM | POA: Diagnosis not present

## 2014-08-04 DIAGNOSIS — J45909 Unspecified asthma, uncomplicated: Secondary | ICD-10-CM | POA: Insufficient documentation

## 2014-08-04 DIAGNOSIS — J449 Chronic obstructive pulmonary disease, unspecified: Secondary | ICD-10-CM

## 2014-08-04 LAB — BASIC METABOLIC PANEL
BUN: 13 mg/dL (ref 6–23)
CO2: 29 mEq/L (ref 19–32)
Calcium: 9.8 mg/dL (ref 8.4–10.5)
Chloride: 105 mEq/L (ref 96–112)
Creatinine, Ser: 0.77 mg/dL (ref 0.40–1.20)
GFR: 79.8 mL/min (ref >60.00)
Glucose, Bld: 101 mg/dL — ABNORMAL HIGH (ref 70–99)
Potassium: 4.7 mEq/L (ref 3.5–5.1)
Sodium: 140 mEq/L (ref 135–145)

## 2014-08-04 LAB — CBC WITH DIFFERENTIAL/PLATELET
Basophils Absolute: 0 10*3/uL (ref 0.0–0.1)
Basophils Relative: 0.5 % (ref 0.0–3.0)
Eosinophils Absolute: 0.1 10*3/uL (ref 0.0–0.7)
Eosinophils Relative: 2.7 % (ref 0.0–5.0)
HCT: 39.3 % (ref 36.0–46.0)
Hemoglobin: 13.3 g/dL (ref 12.0–15.0)
LYMPHS ABS: 1.1 10*3/uL (ref 0.7–4.0)
LYMPHS PCT: 21.4 % (ref 12.0–46.0)
MCHC: 33.9 g/dL (ref 30.0–36.0)
MCV: 95 fl (ref 78.0–100.0)
Monocytes Absolute: 0.5 10*3/uL (ref 0.1–1.0)
Monocytes Relative: 9.6 % (ref 3.0–12.0)
Neutro Abs: 3.4 10*3/uL (ref 1.4–7.7)
Neutrophils Relative %: 65.8 % (ref 43.0–77.0)
PLATELETS: 327 10*3/uL (ref 150.0–400.0)
RBC: 4.14 Mil/uL (ref 3.87–5.11)
RDW: 14.5 % (ref 11.5–15.5)
WBC: 5.2 10*3/uL (ref 4.0–10.5)

## 2014-08-04 LAB — SEDIMENTATION RATE: Sed Rate: 23 mm/hr — ABNORMAL HIGH (ref 0–22)

## 2014-08-04 LAB — BRAIN NATRIURETIC PEPTIDE: Pro B Natriuretic peptide (BNP): 59 pg/mL (ref 0.0–100.0)

## 2014-08-04 MED ORDER — PREDNISONE 10 MG PO TABS: ORAL_TABLET | ORAL | Status: DC

## 2014-08-04 MED ORDER — AMOXICILLIN-POT CLAVULANATE 875-125 MG PO TABS: 1.0000 | ORAL_TABLET | Freq: Two times a day (BID) | ORAL | Status: AC
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
## 2014-08-04 NOTE — Assessment & Plan Note (Signed)
Slow to resolve AEAB - Control cough and triggers rechallenge with broad spectrum abx / and steroid course Pt is on Methotrexate, cxr report neg. Check ESR , if symptoms persist consider CT chest  Check BNP and CBC   Plan  Augmentin 875 mg twice daily, take with food Eat yogurt daily Prednisone taper over the next week Mucinex twice daily as needed for congestion Delsym 2 teaspoons twice daily as needed for cough Labs today Take Symbicort 2 puffs twice daily, rinse after use Follow-up with Dr. Lamonte Sakai in 2-3 weeks and as needed

## 2014-08-04 NOTE — Patient Instructions (Signed)
Augmentin 875 mg twice daily, take with food Eat yogurt daily Prednisone taper over the next week Mucinex twice daily as needed for congestion Delsym 2 teaspoons twice daily as needed for cough Labs today Take Symbicort 2 puffs twice daily, rinse after use Follow-up with Dr. Lamonte Sakai in 2-3 weeks and as needed

## 2014-08-06 DIAGNOSIS — H04123 Dry eye syndrome of bilateral lacrimal glands: Secondary | ICD-10-CM | POA: Insufficient documentation

## 2014-08-06 DIAGNOSIS — H01003 Unspecified blepharitis right eye, unspecified eyelid: Secondary | ICD-10-CM | POA: Insufficient documentation

## 2014-08-26 ENCOUNTER — Encounter: Payer: Self-pay | Admitting: Adult Health

## 2014-08-26 ENCOUNTER — Ambulatory Visit (INDEPENDENT_AMBULATORY_CARE_PROVIDER_SITE_OTHER): Payer: Medicare Other | Admitting: Adult Health

## 2014-08-26 VITALS — BP 128/68 | HR 85 | Temp 97.8°F | Ht 64.0 in | Wt 140.4 lb

## 2014-08-26 DIAGNOSIS — J45901 Unspecified asthma with (acute) exacerbation: Secondary | ICD-10-CM

## 2014-08-26 DIAGNOSIS — R059 Cough, unspecified: Secondary | ICD-10-CM

## 2014-08-26 DIAGNOSIS — M3500 Sicca syndrome, unspecified: Secondary | ICD-10-CM | POA: Diagnosis not present

## 2014-08-26 DIAGNOSIS — R05 Cough: Secondary | ICD-10-CM | POA: Diagnosis not present

## 2014-08-26 DIAGNOSIS — M150 Primary generalized (osteo)arthritis: Secondary | ICD-10-CM | POA: Diagnosis not present

## 2014-08-26 MED ORDER — HYDROCODONE-HOMATROPINE 5-1.5 MG/5ML PO SYRP: 5.0000 mL | ORAL_SOLUTION | Freq: Four times a day (QID) | ORAL | Status: DC | PRN

## 2014-08-26 NOTE — Patient Instructions (Signed)
We are setting you up for a CT chest and Sinus.  Mucinex twice daily as needed for congestion Delsym 2 teaspoons twice daily as needed for cough Hydromet 1-2 tsp every 4hrs as needed for cough-may make you sleepy.  Continue on Symbicort 2 puffs twice daily, rinse after use. Follow-up with Dr. Lamonte Sakai as planned next month with PFT  Please contact office for sooner follow up if symptoms do not improve or worsen or seek emergency care

## 2014-08-26 NOTE — Assessment & Plan Note (Signed)
Cont w/ cough trigger  tx  Plan  We are setting you up for a CT chest and Sinus.  Mucinex twice daily as needed for congestion Delsym 2 teaspoons twice daily as needed for cough Hydromet 1-2 tsp every 4hrs as needed for cough-may make you sleepy.  Continue on Symbicort 2 puffs twice daily, rinse after use. Follow-up with Dr. Lamonte Sakai as planned next month with PFT  Please contact office for sooner follow up if symptoms do not improve or worsen or seek emergency care
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
## 2014-08-26 NOTE — Assessment & Plan Note (Signed)
Slow to resolve flare with refractory cough  Concern that she is now on Methotrexate however nml ESR and CXR  She does have sjogrens and uveitis.   Will proceed with CT chest -HRCT  Check CT sinus as refractory cough flare  Repeat PFT on return ov   Plan  We are setting you up for a CT chest and Sinus.  Mucinex twice daily as needed for congestion Delsym 2 teaspoons twice daily as needed for cough Hydromet 1-2 tsp every 4hrs as needed for cough-may make you sleepy.  Continue on Symbicort 2 puffs twice daily, rinse after use. Follow-up with Dr. Lamonte Sakai as planned next month with PFT  Please contact office for sooner follow up if symptoms do not improve or worsen or seek emergency care

## 2014-09-15 ENCOUNTER — Ambulatory Visit (INDEPENDENT_AMBULATORY_CARE_PROVIDER_SITE_OTHER)
Admission: RE | Admit: 2014-09-15 | Discharge: 2014-09-15 | Disposition: A | Discharge status: Home / Self Care | Payer: Medicare Other | Source: Ambulatory Visit | Attending: Adult Health | Admitting: Adult Health

## 2014-09-15 ENCOUNTER — Other Ambulatory Visit: Payer: Medicare Other

## 2014-09-15 DIAGNOSIS — R059 Cough, unspecified: Secondary | ICD-10-CM

## 2014-09-15 DIAGNOSIS — R05 Cough: Secondary | ICD-10-CM | POA: Diagnosis not present

## 2014-09-17 DIAGNOSIS — M150 Primary generalized (osteo)arthritis: Secondary | ICD-10-CM | POA: Diagnosis not present

## 2014-09-17 DIAGNOSIS — H3093 Unspecified chorioretinal inflammation, bilateral: Secondary | ICD-10-CM | POA: Diagnosis not present

## 2014-09-17 DIAGNOSIS — R05 Cough: Secondary | ICD-10-CM | POA: Diagnosis not present

## 2014-09-17 DIAGNOSIS — M3501 Sicca syndrome with keratoconjunctivitis: Secondary | ICD-10-CM | POA: Diagnosis not present

## 2014-09-19 NOTE — Progress Notes (Signed)
Quick Note:  Patient returned call. Advised of CT results / recs as stated by TP. Pt verbalized understanding and denied any questions. ______

## 2014-09-19 NOTE — Progress Notes (Signed)
Quick Note:  LMOM TCB x1. ______ 

## 2014-09-25 ENCOUNTER — Ambulatory Visit: Payer: Medicare Other | Admitting: Emergency Medicine

## 2014-10-01 ENCOUNTER — Other Ambulatory Visit: Payer: Self-pay | Admitting: Emergency Medicine

## 2014-10-01 DIAGNOSIS — R0600 Dyspnea, unspecified: Secondary | ICD-10-CM

## 2014-10-02 ENCOUNTER — Ambulatory Visit (INDEPENDENT_AMBULATORY_CARE_PROVIDER_SITE_OTHER): Payer: Medicare Other | Admitting: Emergency Medicine

## 2014-10-02 ENCOUNTER — Encounter: Payer: Self-pay | Admitting: Emergency Medicine

## 2014-10-02 VITALS — BP 124/82 | HR 60

## 2014-10-02 DIAGNOSIS — R0600 Dyspnea, unspecified: Secondary | ICD-10-CM

## 2014-10-02 DIAGNOSIS — J383 Other diseases of vocal cords: Secondary | ICD-10-CM

## 2014-10-02 DIAGNOSIS — J4520 Mild intermittent asthma, uncomplicated: Secondary | ICD-10-CM | POA: Diagnosis not present

## 2014-10-02 DIAGNOSIS — M3500 Sicca syndrome, unspecified: Secondary | ICD-10-CM | POA: Diagnosis not present

## 2014-10-02 LAB — PULMONARY FUNCTION TEST
DL/VA % pred: 104 %
DL/VA: 4.87 ml/min/mmHg/L
DLCO unc % pred: 69 %
DLCO unc: 15.88 ml/min/mmHg
FEF 25-75 Post: 1.04 L/sec
FEF 25-75 Pre: 0.91 L/sec
FEF2575-%Change-Post: 13 %
FEF2575-%Pred-Post: 50 %
FEF2575-%Pred-Pre: 44 %
FEV1-%Change-Post: 2 %
FEV1-%Pred-Post: 50 %
FEV1-%Pred-Pre: 49 %
FEV1-Post: 1.17 L
FEV1-Pre: 1.14 L
FEV1FVC-%Change-Post: 0 %
FEV1FVC-%Pred-Pre: 100 %
FEV6-%Change-Post: 2 %
FEV6-%Pred-Post: 51 %
FEV6-%Pred-Pre: 50 %
FEV6-Post: 1.5 L
FEV6-Pre: 1.47 L
FEV6FVC-%Pred-Post: 104 %
FEV6FVC-%Pred-Pre: 104 %
FVC-%Change-Post: 2 %
FVC-%Pred-Post: 49 %
FVC-%Pred-Pre: 48 %
FVC-Post: 1.5 L
FVC-Pre: 1.47 L
Post FEV1/FVC ratio: 78 %
Post FEV6/FVC ratio: 100 %
Pre FEV1/FVC ratio: 77 %
Pre FEV6/FVC Ratio: 100 %
RV % pred: 122 %
RV: 2.52 L
TLC % pred: 84 %
TLC: 4.12 L

## 2014-10-02 NOTE — Patient Instructions (Signed)
Please stop your Symbicort for now Continue to use albuterol 2 puffs if needed for your shortness of breath Recall that some of your acute shortness of breath may be related to throat irritation and throat closure. Work on relaxation techniques to improve this, especially if exposed to smoke, perfume, fumes, etc. Call our office in 2 weeks to let us know if you have tolerated stopping the Symbicort Follow with Dr Lamonte Sakai in 3 months or sooner if you have any problems.
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
## 2014-10-02 NOTE — Assessment & Plan Note (Signed)
Her pulmonary function function testing does support some true asthma. I believe that she also has an upper airway syndrome superimposed on this. I question whether her Symbicort is actually exacerbating her upper airway problems. We will do a trial of stopping the Symbicort to see if she misses it and to see if her upper airway disease benefits

## 2014-10-02 NOTE — Progress Notes (Signed)
PFT done today. 

## 2014-10-02 NOTE — Assessment & Plan Note (Signed)
I do not see any evidence of interstitial lung disease on her CT scan of the chest related to either Sjogren's or to her methotrexate use.Toni Myers Her restrictive lung disease almost certainly due to her scoliosis

## 2014-10-29 DIAGNOSIS — R197 Diarrhea, unspecified: Secondary | ICD-10-CM | POA: Diagnosis not present

## 2014-10-29 DIAGNOSIS — R1010 Upper abdominal pain, unspecified: Secondary | ICD-10-CM | POA: Diagnosis not present

## 2014-10-29 DIAGNOSIS — Z6824 Body mass index (BMI) 24.0-24.9, adult: Secondary | ICD-10-CM | POA: Diagnosis not present

## 2014-10-29 DIAGNOSIS — Z9181 History of falling: Secondary | ICD-10-CM | POA: Diagnosis not present

## 2014-12-16 DIAGNOSIS — M3501 Sicca syndrome with keratoconjunctivitis: Secondary | ICD-10-CM | POA: Diagnosis not present

## 2015-01-19 ENCOUNTER — Other Ambulatory Visit: Payer: Self-pay | Admitting: Physician Assistant

## 2015-01-19 DIAGNOSIS — R1084 Generalized abdominal pain: Secondary | ICD-10-CM

## 2015-01-20 ENCOUNTER — Ambulatory Visit
Admission: RE | Admit: 2015-01-20 | Discharge: 2015-01-20 | Disposition: A | Discharge status: Home / Self Care | Payer: Medicare Other | Source: Ambulatory Visit | Attending: Physician Assistant | Admitting: Physician Assistant

## 2015-01-20 DIAGNOSIS — R1013 Epigastric pain: Secondary | ICD-10-CM | POA: Diagnosis not present

## 2015-01-20 DIAGNOSIS — R1084 Generalized abdominal pain: Secondary | ICD-10-CM

## 2015-01-22 ENCOUNTER — Other Ambulatory Visit (HOSPITAL_COMMUNITY): Payer: Self-pay | Admitting: Physician Assistant

## 2015-01-22 DIAGNOSIS — R1013 Epigastric pain: Secondary | ICD-10-CM | POA: Diagnosis not present

## 2015-01-22 DIAGNOSIS — Z139 Encounter for screening, unspecified: Secondary | ICD-10-CM | POA: Diagnosis not present

## 2015-01-22 DIAGNOSIS — I10 Essential (primary) hypertension: Secondary | ICD-10-CM | POA: Diagnosis not present

## 2015-01-22 DIAGNOSIS — Z0000 Encounter for general adult medical examination without abnormal findings: Secondary | ICD-10-CM | POA: Diagnosis not present

## 2015-01-22 DIAGNOSIS — Z79899 Other long term (current) drug therapy: Secondary | ICD-10-CM | POA: Diagnosis not present

## 2015-01-22 DIAGNOSIS — Z1211 Encounter for screening for malignant neoplasm of colon: Secondary | ICD-10-CM | POA: Diagnosis not present

## 2015-02-02 ENCOUNTER — Ambulatory Visit (HOSPITAL_COMMUNITY)
Admission: RE | Admit: 2015-02-02 | Discharge: 2015-02-02 | Disposition: A | Discharge status: Home / Self Care | Payer: Medicare Other | Source: Ambulatory Visit | Attending: Physician Assistant | Admitting: Physician Assistant

## 2015-02-02 DIAGNOSIS — R1013 Epigastric pain: Secondary | ICD-10-CM | POA: Insufficient documentation

## 2015-02-02 MED ORDER — TECHNETIUM TC 99M MEBROFENIN IV KIT
5.0000 | PACK | Freq: Once | INTRAVENOUS | Status: DC | PRN
  Administered 2015-02-02: 5.23 via INTRAVENOUS
  Filled 2015-02-02: qty 6

## 2015-02-02 MED ORDER — SINCALIDE 5 MCG IJ SOLR
INTRAMUSCULAR | Status: AC
  Administered 2015-02-02: 1.27 ug
  Filled 2015-02-02: qty 5

## 2015-02-05 ENCOUNTER — Other Ambulatory Visit: Payer: Self-pay | Admitting: Gastroenterology

## 2015-02-06 ENCOUNTER — Encounter (HOSPITAL_COMMUNITY): Payer: Self-pay | Admitting: *Deleted

## 2015-02-08 NOTE — Anesthesia Preprocedure Evaluation (Addendum)
Anesthesia Evaluation  Patient identified by MRN, date of birth, ID band Patient awake    Reviewed: Allergy & Precautions, H&P , NPO status , Patient's Chart, lab work & pertinent test results, reviewed documented beta blocker date and time   Airway Mallampati: II  TM Distance: >3 FB Neck ROM: Full    Dental no notable dental hx. (+) Dental Advisory Given, Teeth Intact   Pulmonary asthma , COPD,  COPD inhaler,  Vocal cord dysfunction   Pulmonary exam normal breath sounds clear to auscultation       Cardiovascular hypertension, Pt. on medications Normal cardiovascular exam Rhythm:regular Rate:Normal  Palpitations. syncope   Neuro/Psych Anxiety Depression dementia negative neurological ROS  negative psych ROS   GI/Hepatic negative GI ROS, Neg liver ROS, GERD  Medicated and Controlled,  Endo/Other  negative endocrine ROS  Renal/GU negative Renal ROS  negative genitourinary   Musculoskeletal  (+) Arthritis , Rheumatoid disorders,    Abdominal   Peds  Hematology negative hematology ROS (+)   Anesthesia Other Findings Sjogren's  Reproductive/Obstetrics negative OB ROS                            Anesthesia Physical Anesthesia Plan  ASA: III  Anesthesia Plan: MAC   Post-op Pain Management:    Induction:   Airway Management Planned:   Additional Equipment:   Intra-op Plan:   Post-operative Plan:   Informed Consent: I have reviewed the patients History and Physical, chart, labs and discussed the procedure including the risks, benefits and alternatives for the proposed anesthesia with the patient or authorized representative who has indicated his/her understanding and acceptance.   Dental Advisory Given  Plan Discussed with: CRNA and Surgeon  Anesthesia Plan Comments:         Anesthesia Quick Evaluation

## 2015-02-09 ENCOUNTER — Ambulatory Visit (HOSPITAL_COMMUNITY): Payer: Medicare Other | Admitting: Certified Registered Nurse Anesthetist

## 2015-02-09 ENCOUNTER — Encounter (HOSPITAL_COMMUNITY)
Admission: RE | Disposition: A | Discharge status: Home / Self Care | Payer: Self-pay | Source: Ambulatory Visit | Attending: Gastroenterology

## 2015-02-09 ENCOUNTER — Ambulatory Visit (HOSPITAL_COMMUNITY)
Admission: RE | Admit: 2015-02-09 | Discharge: 2015-02-09 | Disposition: A | Discharge status: Home / Self Care | Payer: Medicare Other | Source: Ambulatory Visit | Attending: Gastroenterology | Admitting: Gastroenterology

## 2015-02-09 ENCOUNTER — Encounter (HOSPITAL_COMMUNITY): Payer: Self-pay | Admitting: *Deleted

## 2015-02-09 DIAGNOSIS — Z79899 Other long term (current) drug therapy: Secondary | ICD-10-CM | POA: Insufficient documentation

## 2015-02-09 DIAGNOSIS — K621 Rectal polyp: Secondary | ICD-10-CM | POA: Diagnosis not present

## 2015-02-09 DIAGNOSIS — M3500 Sicca syndrome, unspecified: Secondary | ICD-10-CM | POA: Diagnosis not present

## 2015-02-09 DIAGNOSIS — M069 Rheumatoid arthritis, unspecified: Secondary | ICD-10-CM | POA: Diagnosis not present

## 2015-02-09 DIAGNOSIS — D128 Benign neoplasm of rectum: Secondary | ICD-10-CM | POA: Insufficient documentation

## 2015-02-09 DIAGNOSIS — R109 Unspecified abdominal pain: Secondary | ICD-10-CM | POA: Insufficient documentation

## 2015-02-09 DIAGNOSIS — K219 Gastro-esophageal reflux disease without esophagitis: Secondary | ICD-10-CM | POA: Insufficient documentation

## 2015-02-09 DIAGNOSIS — R197 Diarrhea, unspecified: Secondary | ICD-10-CM | POA: Insufficient documentation

## 2015-02-09 DIAGNOSIS — J449 Chronic obstructive pulmonary disease, unspecified: Secondary | ICD-10-CM | POA: Diagnosis not present

## 2015-02-09 DIAGNOSIS — I10 Essential (primary) hypertension: Secondary | ICD-10-CM | POA: Insufficient documentation

## 2015-02-09 DIAGNOSIS — M545 Low back pain: Secondary | ICD-10-CM | POA: Insufficient documentation

## 2015-02-09 DIAGNOSIS — K2950 Unspecified chronic gastritis without bleeding: Secondary | ICD-10-CM | POA: Insufficient documentation

## 2015-02-09 DIAGNOSIS — G8929 Other chronic pain: Secondary | ICD-10-CM | POA: Diagnosis not present

## 2015-02-09 DIAGNOSIS — J45909 Unspecified asthma, uncomplicated: Secondary | ICD-10-CM | POA: Diagnosis not present

## 2015-02-09 DIAGNOSIS — R112 Nausea with vomiting, unspecified: Secondary | ICD-10-CM | POA: Diagnosis not present

## 2015-02-09 DIAGNOSIS — G3184 Mild cognitive impairment, so stated: Secondary | ICD-10-CM | POA: Insufficient documentation

## 2015-02-09 DIAGNOSIS — Z1211 Encounter for screening for malignant neoplasm of colon: Secondary | ICD-10-CM | POA: Diagnosis not present

## 2015-02-09 HISTORY — PX: COLONOSCOPY WITH PROPOFOL: SHX5780

## 2015-02-09 HISTORY — PX: ESOPHAGOGASTRODUODENOSCOPY (EGD) WITH PROPOFOL: SHX5813

## 2015-02-09 SURGERY — ESOPHAGOGASTRODUODENOSCOPY (EGD) WITH PROPOFOL
Anesthesia: Monitor Anesthesia Care

## 2015-02-09 MED ORDER — SODIUM CHLORIDE 0.9 % IV SOLN: INTRAVENOUS | Status: DC

## 2015-02-09 MED ORDER — LACTATED RINGERS IV SOLN
INTRAVENOUS | Status: DC
  Administered 2015-02-09: 1000 mL via INTRAVENOUS

## 2015-02-09 MED ORDER — BUTAMBEN-TETRACAINE-BENZOCAINE 2-2-14 % EX AERO
INHALATION_SPRAY | CUTANEOUS | Status: DC | PRN
  Administered 2015-02-09: 1 via TOPICAL

## 2015-02-09 MED ORDER — LIDOCAINE HCL (CARDIAC) 20 MG/ML IV SOLN
INTRAVENOUS | Status: AC
  Filled 2015-02-09: qty 5

## 2015-02-09 MED ORDER — PROPOFOL 10 MG/ML IV BOLUS
INTRAVENOUS | Status: DC | PRN
  Administered 2015-02-09 (×2): 20 mg via INTRAVENOUS
  Administered 2015-02-09: 75 mg via INTRAVENOUS
  Administered 2015-02-09: 25 mg via INTRAVENOUS
  Administered 2015-02-09: 20 mg via INTRAVENOUS
  Administered 2015-02-09: 30 mg via INTRAVENOUS
  Administered 2015-02-09: 20 mg via INTRAVENOUS
  Administered 2015-02-09: 40 mg via INTRAVENOUS

## 2015-02-09 MED ORDER — PROPOFOL 10 MG/ML IV BOLUS
INTRAVENOUS | Status: AC
  Filled 2015-02-09: qty 20

## 2015-02-09 MED ORDER — LIDOCAINE HCL (CARDIAC) 20 MG/ML IV SOLN
INTRAVENOUS | Status: DC | PRN
  Administered 2015-02-09: 50 mg via INTRAVENOUS

## 2015-02-09 SURGICAL SUPPLY — 25 items

## 2015-02-09 NOTE — H&P (Signed)
  Problems: Unexplained abdominal cramps with nausea, vomiting, and watery diarrhea unassociated with weight loss. Screening colonoscopy.  History: The patient is a 64 year old female born April 10, 1951. She has chronic, unexplained intermittent postprandial nausea, vomiting, abdominal cramps, and watery diarrhea unassociated with weight loss, dysphagia, or heartburn. She chronically takes Nexium each morning and does not use non-steroidal anti-inflammatory medication. There is no past history of peptic ulcer disease. Her abdominal ultrasound did not show gallstones. There is a family history of inflammatory bowel disease. She is due for a repeat screening colonoscopy to prevent colon cancer.  The patient is scheduled to undergo diagnostic esophagogastroduodenoscopy followed by screening colonoscopy today.  Past medical history: Severe restrictive lung disease. Sjogren's disease. Memory loss. Restless leg syndrome. Chronic obstructive pulmonary disease. Gastroesophageal reflux. Anxiety with depression. Migraine headache syndrome. Uveitis. Palpitations. Chronic low back pain. Insomnia. Hypertension. Mild cognitive impairment with memory loss. Tubal ligation. Lumbar back surgery.  Medication allergies: Plaquenil causes visual impairment.  Exam: The patient is alert and lying comfortably on the endoscopy stretcher. Abdomen is soft and nontender to palpation. Lungs are clear to auscultation. Cardiac exam reveals a regular rhythm.  Plan: Proceed with diagnostic esophagogastroduodenoscopy. Perform small bowel biopsies to look for villous atrophy and gastric biopsies to look for H. pylori gastritis. Perform screening colonoscopy. Perform random colon biopsies to look for microscopic colitis.

## 2015-02-09 NOTE — Anesthesia Postprocedure Evaluation (Signed)
  Anesthesia Post-op Note  Patient: Toni Myers  Procedure(s) Performed: Procedure(s) (LRB): ESOPHAGOGASTRODUODENOSCOPY (EGD) WITH PROPOFOL (N/A) COLONOSCOPY WITH PROPOFOL (N/A)  Patient Location: PACU  Anesthesia Type: MAC  Level of Consciousness: awake and alert   Airway and Oxygen Therapy: Patient Spontanous Breathing  Post-op Pain: mild  Post-op Assessment: Post-op Vital signs reviewed, Patient's Cardiovascular Status Stable, Respiratory Function Stable, Patent Airway and No signs of Nausea or vomiting  Last Vitals:  Filed Vitals:   02/09/15 0843  BP: 136/95  Temp:   Resp: 15    Post-op Vital Signs: stable   Complications: No apparent anesthesia complications

## 2015-02-09 NOTE — Op Note (Signed)
Problems: Chronic, intermittent postprandial nausea, vomiting, abdominal cramps, and watery diarrhea on Nexium, Aricept, methotrexate, and escitalopram  Endoscopist: Earle Gell  Premedication: Propofol administered by anesthesia  Procedure: Diagnostic esophagogastroduodenoscopy with small bowel biopsies and gastric biopsies The patient was placed in the left lateral decubitus position. The Pentax gastroscope was passed through the posterior hypopharynx into the proximal esophagus without difficulty. The hypopharynx, larynx, and vocal cords appeared normal.  Esophagoscopy: The proximal, mid, and lower segments of the esophageal mucosa appeared completely normal. The squamocolumnar junction was regular in appearance and noted at 42 cm from the incisor teeth. There is no endoscopic evidence for the presence of Barrett's esophagus or erosive esophagitis  Gastroscopy: Retroflex view of the gastric cardia and fundus was normal. The gastric body, antrum, and pylorus appeared normal. Random biopsies were taken from the gastric antrum, incisura, gastric body, and gastric cardia.  Duodenoscopy: The duodenal bulb and descending duodenum appeared normal. Four biopsies were taken from the second portion of the duodenum and a biopsy was taken from the duodenal bulb.  Assessment: Normal esophagogastroduodenoscopy. Small bowel biopsies to look for villous atrophy associated with celiac disease and random gastric biopsies to look for H. pylori gastric pending  Procedure: Screening colonoscopy Anal inspection and digital rectal exam were normal. The Pentax pediatric colonoscope was introduced into the rectum and advanced to the cecum. A normal-appearing ileocecal valve and appendiceal orifice were identified. Colonic preparation exam today was good. Withdrawal time was 12 minutes  Rectum. From the distal rectum, and 8 mm sessile polyp was removed with the hot snare. Retroflexed view of the distal rectum was  otherwise normal.  Sigmoid colon and descending colon. Normal  Splenic flexure. Normal  Transverse colon. Normal  Hepatic flexure. Normal  Ascending colon. Normal  Cecum and ileocecal valve. Normal  Random colon biopsies. Biopsies were taken from the right colon and left colon to look for microscopic colitis associated with the use of Nexium and escitalopram.  Assessment: A small polyp was taken from the distal rectum with the hot snare. Colonoscopy was otherwise normal. Random colon biopsies to look for microscopic colitis pending.

## 2015-02-09 NOTE — Transfer of Care (Signed)
Immediate Anesthesia Transfer of Care Note  Patient: Toni Myers  Procedure(s) Performed: Procedure(s): ESOPHAGOGASTRODUODENOSCOPY (EGD) WITH PROPOFOL (N/A) COLONOSCOPY WITH PROPOFOL (N/A)  Patient Location: PACU  Anesthesia Type:MAC  Level of Consciousness: awake, alert  and oriented  Airway & Oxygen Therapy: Patient Spontanous Breathing and Patient connected to nasal cannula oxygen  Post-op Assessment: Report given to RN and Post -op Vital signs reviewed and stable  Post vital signs: Reviewed and stable  Last Vitals:  Filed Vitals:   02/09/15 0657  BP: 117/82  Temp: 36.8 C  Resp: 18    Complications: No apparent anesthesia complications

## 2015-02-09 NOTE — Discharge Instructions (Signed)
Esophagogastroduodenoscopy °Care After °Refer to this sheet in the next few weeks. These instructions provide you with information on caring for yourself after your procedure. Your caregiver may also give you more specific instructions. Your treatment has been planned according to current medical practices, but problems sometimes occur. Call your caregiver if you have any problems or questions after your procedure.  °HOME CARE INSTRUCTIONS °· Do not eat or drink anything until the numbing medicine (local anesthetic) has worn off and your gag reflex has returned. You will know that the local anesthetic has worn off when you can swallow comfortably. °· Do not drive for 12 hours after the procedure or as directed by your caregiver. °· Only take medicines as directed by your caregiver. °SEEK MEDICAL CARE IF:  °· You cannot stop coughing. °· You are not urinating at all or less than usual. °SEEK IMMEDIATE MEDICAL CARE IF: °· You have difficulty swallowing. °· You cannot eat or drink. °· You have worsening throat or chest pain. °· You have dizziness, lightheadedness, or you faint. °· You have nausea or vomiting. °· You have chills. °· You have a fever. °· You have severe abdominal pain. °· You have black, tarry, or bloody stools. °Document Released: 04/25/2012 Document Reviewed: 04/25/2012 °[employer]® Patient Information ©2015 [employer]. This information is not intended to replace advice given to you by your health care provider. Make sure you discuss any questions you have with your health care provider. ° °Colonoscopy, Care After °Refer to this sheet in the next few weeks. These instructions provide you with information on caring for yourself after your procedure. Your health care provider may also give you more specific instructions. Your treatment has been planned according to current medical practices, but problems sometimes occur. Call your health care provider if you have any problems or questions after your  procedure. °WHAT TO EXPECT AFTER THE PROCEDURE  °After your procedure, it is typical to have the following: °· A small amount of blood in your stool. °· Moderate amounts of gas and mild abdominal cramping or bloating. °HOME CARE INSTRUCTIONS °· Do not drive, operate machinery, or sign important documents for 24 hours. °· You may shower and resume your regular physical activities, but move at a slower pace for the first 24 hours. °· Take frequent rest periods for the first 24 hours. °· Walk around or put a warm pack on your abdomen to help reduce abdominal cramping and bloating. °· Drink enough fluids to keep your urine clear or pale yellow. °· You may resume your normal diet as instructed by your health care provider. Avoid heavy or fried foods that are hard to digest. °· Avoid drinking alcohol for 24 hours or as instructed by your health care provider. °· Only take over-the-counter or prescription medicines as directed by your health care provider. °· If a tissue sample (biopsy) was taken during your procedure: °¨ Do not take aspirin or blood thinners for 7 days, or as instructed by your health care provider. °¨ Do not drink alcohol for 7 days, or as instructed by your health care provider. °¨ Eat soft foods for the first 24 hours. °SEEK MEDICAL CARE IF: °You have persistent spotting of blood in your stool 2-3 days after the procedure. °SEEK IMMEDIATE MEDICAL CARE IF: °· You have more than a small spotting of blood in your stool. °· You pass large blood clots in your stool. °· Your abdomen is swollen (distended). °· You have nausea or vomiting. °· You have a   fever. °· You have increasing abdominal pain that is not relieved with medicine. °Document Released: 12/22/2003 Document Revised: 02/27/2013 Document Reviewed: 01/14/2013 °Exit[employer]® Patient Information ©2015 [employer]. This information is not intended to replace advice given to you by your health care provider. Make sure you discuss any questions you have  with your health care provider. ° ° ° °

## 2015-02-10 ENCOUNTER — Encounter (HOSPITAL_COMMUNITY): Payer: Self-pay | Admitting: Gastroenterology

## 2015-02-23 ENCOUNTER — Other Ambulatory Visit: Payer: Self-pay | Admitting: Gastroenterology

## 2015-02-23 DIAGNOSIS — R112 Nausea with vomiting, unspecified: Secondary | ICD-10-CM

## 2015-03-02 ENCOUNTER — Ambulatory Visit
Admission: RE | Admit: 2015-03-02 | Discharge: 2015-03-02 | Disposition: A | Discharge status: Home / Self Care | Payer: Medicare Other | Source: Ambulatory Visit | Attending: Gastroenterology | Admitting: Gastroenterology

## 2015-03-02 DIAGNOSIS — R112 Nausea with vomiting, unspecified: Secondary | ICD-10-CM

## 2015-03-03 DIAGNOSIS — J441 Chronic obstructive pulmonary disease with (acute) exacerbation: Secondary | ICD-10-CM | POA: Diagnosis not present

## 2015-03-03 DIAGNOSIS — G3184 Mild cognitive impairment, so stated: Secondary | ICD-10-CM | POA: Diagnosis not present

## 2015-03-03 DIAGNOSIS — F329 Major depressive disorder, single episode, unspecified: Secondary | ICD-10-CM | POA: Diagnosis not present

## 2015-03-03 DIAGNOSIS — R197 Diarrhea, unspecified: Secondary | ICD-10-CM | POA: Diagnosis not present

## 2015-03-03 DIAGNOSIS — Z6824 Body mass index (BMI) 24.0-24.9, adult: Secondary | ICD-10-CM | POA: Diagnosis not present

## 2015-03-03 DIAGNOSIS — I10 Essential (primary) hypertension: Secondary | ICD-10-CM | POA: Diagnosis not present

## 2015-03-12 DIAGNOSIS — R112 Nausea with vomiting, unspecified: Secondary | ICD-10-CM | POA: Diagnosis not present

## 2015-03-12 DIAGNOSIS — R1013 Epigastric pain: Secondary | ICD-10-CM | POA: Diagnosis not present

## 2015-03-24 DIAGNOSIS — H43812 Vitreous degeneration, left eye: Secondary | ICD-10-CM | POA: Diagnosis not present

## 2015-03-24 DIAGNOSIS — H53001 Unspecified amblyopia, right eye: Secondary | ICD-10-CM | POA: Diagnosis not present

## 2015-03-24 DIAGNOSIS — H04123 Dry eye syndrome of bilateral lacrimal glands: Secondary | ICD-10-CM | POA: Diagnosis not present

## 2015-03-24 DIAGNOSIS — H2513 Age-related nuclear cataract, bilateral: Secondary | ICD-10-CM | POA: Diagnosis not present

## 2015-03-24 DIAGNOSIS — H1840 Unspecified corneal degeneration: Secondary | ICD-10-CM | POA: Diagnosis not present

## 2015-03-24 DIAGNOSIS — M3500 Sicca syndrome, unspecified: Secondary | ICD-10-CM | POA: Diagnosis not present

## 2015-03-24 DIAGNOSIS — H209 Unspecified iridocyclitis: Secondary | ICD-10-CM | POA: Diagnosis not present

## 2015-03-25 DIAGNOSIS — Z1389 Encounter for screening for other disorder: Secondary | ICD-10-CM | POA: Diagnosis not present

## 2015-03-25 DIAGNOSIS — L03115 Cellulitis of right lower limb: Secondary | ICD-10-CM | POA: Diagnosis not present

## 2015-03-25 DIAGNOSIS — Z23 Encounter for immunization: Secondary | ICD-10-CM | POA: Diagnosis not present

## 2015-03-25 DIAGNOSIS — Z6824 Body mass index (BMI) 24.0-24.9, adult: Secondary | ICD-10-CM | POA: Diagnosis not present

## 2015-03-25 DIAGNOSIS — J441 Chronic obstructive pulmonary disease with (acute) exacerbation: Secondary | ICD-10-CM | POA: Diagnosis not present

## 2015-04-15 DIAGNOSIS — R1013 Epigastric pain: Secondary | ICD-10-CM | POA: Diagnosis not present

## 2015-04-30 DIAGNOSIS — B029 Zoster without complications: Secondary | ICD-10-CM | POA: Diagnosis not present

## 2015-04-30 DIAGNOSIS — M3501 Sicca syndrome with keratoconjunctivitis: Secondary | ICD-10-CM | POA: Diagnosis not present

## 2015-04-30 DIAGNOSIS — M8448XA Pathological fracture, other site, initial encounter for fracture: Secondary | ICD-10-CM | POA: Diagnosis not present

## 2015-04-30 DIAGNOSIS — M419 Scoliosis, unspecified: Secondary | ICD-10-CM | POA: Diagnosis not present

## 2015-04-30 DIAGNOSIS — M810 Age-related osteoporosis without current pathological fracture: Secondary | ICD-10-CM | POA: Diagnosis not present

## 2015-04-30 DIAGNOSIS — G540 Brachial plexus disorders: Secondary | ICD-10-CM | POA: Diagnosis not present

## 2015-05-21 DIAGNOSIS — D2372 Other benign neoplasm of skin of left lower limb, including hip: Secondary | ICD-10-CM | POA: Diagnosis not present

## 2015-05-21 DIAGNOSIS — T691XXA Chilblains, initial encounter: Secondary | ICD-10-CM | POA: Diagnosis not present

## 2015-07-13 DIAGNOSIS — M3500 Sicca syndrome, unspecified: Secondary | ICD-10-CM | POA: Diagnosis not present

## 2015-07-13 DIAGNOSIS — Z6825 Body mass index (BMI) 25.0-25.9, adult: Secondary | ICD-10-CM | POA: Diagnosis not present

## 2015-07-13 DIAGNOSIS — I10 Essential (primary) hypertension: Secondary | ICD-10-CM | POA: Diagnosis not present

## 2015-07-13 DIAGNOSIS — N390 Urinary tract infection, site not specified: Secondary | ICD-10-CM | POA: Diagnosis not present

## 2015-07-13 DIAGNOSIS — T691XXA Chilblains, initial encounter: Secondary | ICD-10-CM | POA: Diagnosis not present

## 2015-07-14 DIAGNOSIS — N390 Urinary tract infection, site not specified: Secondary | ICD-10-CM | POA: Diagnosis not present

## 2015-07-23 DIAGNOSIS — L570 Actinic keratosis: Secondary | ICD-10-CM | POA: Diagnosis not present

## 2015-07-23 DIAGNOSIS — T691XXD Chilblains, subsequent encounter: Secondary | ICD-10-CM | POA: Diagnosis not present

## 2015-07-23 DIAGNOSIS — D485 Neoplasm of uncertain behavior of skin: Secondary | ICD-10-CM | POA: Diagnosis not present

## 2015-07-23 DIAGNOSIS — L438 Other lichen planus: Secondary | ICD-10-CM | POA: Diagnosis not present

## 2015-09-02 DIAGNOSIS — L0109 Other impetigo: Secondary | ICD-10-CM | POA: Diagnosis not present

## 2015-09-02 DIAGNOSIS — T691XXD Chilblains, subsequent encounter: Secondary | ICD-10-CM | POA: Diagnosis not present

## 2015-09-02 DIAGNOSIS — L239 Allergic contact dermatitis, unspecified cause: Secondary | ICD-10-CM | POA: Diagnosis not present

## 2015-09-24 DIAGNOSIS — J449 Chronic obstructive pulmonary disease, unspecified: Secondary | ICD-10-CM | POA: Diagnosis not present

## 2015-09-24 DIAGNOSIS — H53001 Unspecified amblyopia, right eye: Secondary | ICD-10-CM | POA: Diagnosis not present

## 2015-09-24 DIAGNOSIS — Z79899 Other long term (current) drug therapy: Secondary | ICD-10-CM | POA: Diagnosis not present

## 2015-09-24 DIAGNOSIS — H5210 Myopia, unspecified eye: Secondary | ICD-10-CM | POA: Diagnosis not present

## 2015-09-24 DIAGNOSIS — H0289 Other specified disorders of eyelid: Secondary | ICD-10-CM | POA: Diagnosis not present

## 2015-09-24 DIAGNOSIS — H2513 Age-related nuclear cataract, bilateral: Secondary | ICD-10-CM | POA: Diagnosis not present

## 2015-09-24 DIAGNOSIS — I10 Essential (primary) hypertension: Secondary | ICD-10-CM | POA: Diagnosis not present

## 2015-09-24 DIAGNOSIS — H52209 Unspecified astigmatism, unspecified eye: Secondary | ICD-10-CM | POA: Diagnosis not present

## 2015-09-24 DIAGNOSIS — M3501 Sicca syndrome with keratoconjunctivitis: Secondary | ICD-10-CM | POA: Diagnosis not present

## 2015-09-24 DIAGNOSIS — Z885 Allergy status to narcotic agent status: Secondary | ICD-10-CM | POA: Diagnosis not present

## 2015-09-24 DIAGNOSIS — Z7951 Long term (current) use of inhaled steroids: Secondary | ICD-10-CM | POA: Diagnosis not present

## 2015-09-24 DIAGNOSIS — H01006 Unspecified blepharitis left eye, unspecified eyelid: Secondary | ICD-10-CM | POA: Diagnosis not present

## 2015-09-24 DIAGNOSIS — Z0100 Encounter for examination of eyes and vision without abnormal findings: Secondary | ICD-10-CM | POA: Diagnosis not present

## 2015-09-24 DIAGNOSIS — M3500 Sicca syndrome, unspecified: Secondary | ICD-10-CM | POA: Diagnosis not present

## 2015-09-24 DIAGNOSIS — H04129 Dry eye syndrome of unspecified lacrimal gland: Secondary | ICD-10-CM | POA: Diagnosis not present

## 2015-09-24 DIAGNOSIS — H209 Unspecified iridocyclitis: Secondary | ICD-10-CM | POA: Diagnosis not present

## 2015-09-24 DIAGNOSIS — H16149 Punctate keratitis, unspecified eye: Secondary | ICD-10-CM | POA: Diagnosis not present

## 2015-09-24 DIAGNOSIS — H43813 Vitreous degeneration, bilateral: Secondary | ICD-10-CM | POA: Diagnosis not present

## 2015-09-24 DIAGNOSIS — H01003 Unspecified blepharitis right eye, unspecified eyelid: Secondary | ICD-10-CM | POA: Diagnosis not present

## 2015-09-28 ENCOUNTER — Telehealth: Payer: Self-pay | Admitting: Acute Care

## 2015-09-28 ENCOUNTER — Ambulatory Visit (INDEPENDENT_AMBULATORY_CARE_PROVIDER_SITE_OTHER): Payer: PPO | Admitting: Acute Care

## 2015-09-28 ENCOUNTER — Encounter: Payer: Self-pay | Admitting: Acute Care

## 2015-09-28 ENCOUNTER — Ambulatory Visit (INDEPENDENT_AMBULATORY_CARE_PROVIDER_SITE_OTHER)
Admission: RE | Admit: 2015-09-28 | Discharge: 2015-09-28 | Disposition: A | Discharge status: Home / Self Care | Payer: PPO | Source: Ambulatory Visit | Attending: Acute Care | Admitting: Acute Care

## 2015-09-28 VITALS — BP 124/72 | HR 113 | Ht 64.0 in | Wt 137.0 lb

## 2015-09-28 DIAGNOSIS — J45901 Unspecified asthma with (acute) exacerbation: Secondary | ICD-10-CM

## 2015-09-28 DIAGNOSIS — J4520 Mild intermittent asthma, uncomplicated: Secondary | ICD-10-CM | POA: Diagnosis not present

## 2015-09-28 DIAGNOSIS — R0602 Shortness of breath: Secondary | ICD-10-CM | POA: Diagnosis not present

## 2015-09-28 MED ORDER — CLOTRIMAZOLE 10 MG MT TROC: 10.0000 mg | Freq: Every day | OROMUCOSAL | Status: DC

## 2015-09-28 MED ORDER — AZITHROMYCIN 250 MG PO TABS: ORAL_TABLET | ORAL | Status: DC

## 2015-09-28 MED ORDER — PREDNISONE 10 MG PO TABS: ORAL_TABLET | ORAL | Status: DC

## 2015-09-28 MED ORDER — LEVALBUTEROL HCL 0.63 MG/3ML IN NEBU
0.6300 mg | INHALATION_SOLUTION | Freq: Once | RESPIRATORY_TRACT | Status: AC
  Administered 2015-09-28: 0.63 mg via RESPIRATORY_TRACT

## 2015-09-28 MED ORDER — HYDROCODONE-HOMATROPINE 5-1.5 MG/5ML PO SYRP: 5.0000 mL | ORAL_SOLUTION | Freq: Four times a day (QID) | ORAL | Status: DC | PRN

## 2015-09-28 NOTE — Telephone Encounter (Signed)
These results have been called to the patient. I explained that her chest x-ray was negative for pneumonia. She verbalized understanding and had no further questions.

## 2015-09-28 NOTE — Assessment & Plan Note (Signed)
Continue Symbicort Daily Continue Albuterol Neb treatments as needed. Rinse/ brush teeth after use. Please contact office for sooner follow up if symptoms do not improve or worsen or seek emergency care

## 2015-09-28 NOTE — Patient Instructions (Addendum)
It is nice to meet you today. We will give you a neb treatment in the office today. We will check a CXR today. We will call you with the results. Z-Pack, take as directed. We will prescribe a Prednisone taper; 10 mg tablets: 4 tabs x 2 days, 3 tabs x 2 days, 2 tabs x 2 days 1 tab x 2 days then stop. Hydromet cough syrup 5 cc's every 6 hours as needed for cough. Do not drive if sleepy. Mucinex twice daily with a full glass of water. Mycelex Troaches 5 times daily x 1 week. Follow up in 2 weeks  Please contact office for sooner follow up if symptoms do not improve or worsen or seek emergency care

## 2015-09-28 NOTE — Addendum Note (Signed)
Addended by: Parke Poisson E on: 09/28/2015 05:03 PM   Modules accepted: Orders

## 2015-09-28 NOTE — Telephone Encounter (Signed)
Patient calling to get CXR results. CXR results are in EPIC, but have not been resulted out yet.  Sarah, please advise.

## 2015-09-28 NOTE — Progress Notes (Signed)
Subjective:    Patient ID: Toni Myers, female    DOB: 1950-08-09, 65 y.o.   MRN: [id number]  HPI 65 yo woman, never smoker, hx of Sjogren's, HTN, depression. Has a hx of recurrent cough usually worst in Fall/Spring. She was well until about 5 months ago, then after URI about 5 months ago she has had persistent cough, rarely productive. No real nasal gtt or congestion. She was started on Symbicort 53yrs ago, only using prn, then was started on Spiriva more recently - has only used a few times. Has used ProAir prn.   09/28/2015 Office Visit: Pt presents to the office with , cough, wheezing, orthopnea,yellow thick sputum, " maybe" a low grade fever. She mowed her lawn 2 weeks ago and has had cough with purulent secretions since, now having to sleep sitting up.She did not wear a mask.She has recently had 2 dermal staff infections both of which were treated with antibiotics. ( Keflex, and Ampicillin ). She denies chest pain,hemoptysis, leg or calf pain.   Current outpatient prescriptions:  .  albuterol (PROAIR HFA) 108 (90 BASE) MCG/ACT inhaler, Inhale 2 puffs into the lungs every 6 (six) hours as needed for wheezing or shortness of breath. , Disp: , Rfl:  .  albuterol (PROVENTIL) (2.5 MG/3ML) 0.083% nebulizer solution, Take 2.5 mg by nebulization every 4 (four) hours as needed for wheezing or shortness of breath. , Disp: , Rfl:  .  budesonide-formoterol (SYMBICORT) 160-4.5 MCG/ACT inhaler, Inhale 2 puffs into the lungs 2 (two) times daily as needed (SOB). , Disp: , Rfl:  .  calcium citrate (CALCITRATE - DOSED IN MG ELEMENTAL CALCIUM) 950 MG tablet, Take 200 mg of elemental calcium by mouth daily., Disp: , Rfl:  .  cyclobenzaprine (FLEXERIL) 10 MG tablet, Take 1 tablet (10 mg total) by mouth at bedtime as needed. For spasms, Disp: 20 tablet, Rfl: 0 .  escitalopram (LEXAPRO) 5 MG tablet, Take 5 mg by mouth daily., Disp: , Rfl:  .  esomeprazole (NEXIUM 24HR) 20 MG capsule, Take 20 mg by mouth daily at  12 noon., Disp: , Rfl:  .  FOLIC ACID PO, Take 2 tablets by mouth daily. , Disp: , Rfl:  .  Methotrexate, PF, 25 MG/0.5ML SOAJ, Inject 0.6 mLs into the skin once a week. Wednesday, Disp: , Rfl:  .  PRESCRIPTION MEDICATION, Place 1 drop into both eyes 4 (four) times daily. Eye drop made from pt's own blood, Disp: , Rfl:    Past Medical History  Diagnosis Date  . Hypertension   . Cough   . Depression   . RLS (restless legs syndrome)   . Chronic low back pain   . Migraine   . Palpitations   . Sjogrens syndrome ([place])   . Anxiety   . Shingles   . Arthritis     Rheumatoid  . Asthma   . Chronic iritis, left eye   . GERD (gastroesophageal reflux disease)   . COPD (chronic obstructive pulmonary disease) ([place])     no o2, Dr.Robert Byrum   . Dementia without behavioral disturbance     Allergies  Allergen Reactions  . Morphine And Related Nausea And Vomiting    "makes me sick at high doses"    Review of Systems Constitutional:   No  weight loss, night sweats,  Fevers, chills, fatigue, or  lassitude.  HEENT:   No headaches,  Difficulty swallowing,  Tooth/dental problems, or  Sore throat,  No sneezing, itching, ear ache, nasal congestion, post nasal drip,   CV:  No chest pain,  + Orthopnea, PND, swelling in lower extremities, anasarca, dizziness, palpitations, syncope.   GI  No heartburn, indigestion, abdominal pain, nausea, vomiting, diarrhea, change in bowel habits, loss of appetite, bloody stools.   Resp: + shortness of breath with exertion or at rest.  N+excess mucus, + productive cough,  No non-productive cough,  No coughing up of blood.  +change in color of mucus.  + wheezing.  No chest wall deformity  Skin: no rash or lesions.  GU: no dysuria, change in color of urine, no urgency or frequency.  No flank pain, no hematuria   MS:  No joint pain or swelling.  No decreased range of motion.  No back pain.  Psych:  No change in mood or affect. No depression or  anxiety.  No memory loss.        Objective:   Physical Exam BP 124/72 mmHg  Pulse 113  Ht 5\' 4"  (1.626 m)  Wt 137 lb (62.143 kg)  BMI 23.50 kg/m2  SpO2 97%  Physical Exam:  General- No distress,  A&Ox3 ENT: No sinus tenderness, TM clear, pale nasal mucosa, no oral exudate,no post nasal drip, no LAN Cardiac: S1, S2, regular rate and rhythm, no murmur Chest: + Exp. wheeze/ no rales/ dullness; no accessory muscle use, no nasal flaring, no sternal retractions Abd.: Soft Non-tender Ext: No clubbing cyanosis, edema Neuro:  normal strength Skin: No rashes, warm and dry Psych: normal mood and behavior  Magdalen Spatz, AGACNP-BC [facility] 09/28/2015    Assessment & Plan:

## 2015-09-28 NOTE — Assessment & Plan Note (Addendum)
Acute Bronchitis with seasonal allergies as suspected trigger Plan: We will give you a nebulizer treatment in the office today. We will check a CXR today. We will call you with the results. Z-Pack, take as directed. We will prescribe a Prednisone taper; 10 mg tablets: 4 tabs x 2 days, 3 tabs x 2 days, 2 tabs x 2 days 1 tab x 2 days then stop. Hydromet cough syrup 5 cc's every 6 hours as needed for cough. Do not drive if sleepy. Mucinex twice daily with a full glass of water. Mycelex [place] 5 times daily x 1 week. Follow up in 2 weeks  Please contact office for sooner follow up if symptoms do not improve or worsen or seek emergency care

## 2015-09-29 DIAGNOSIS — L820 Inflamed seborrheic keratosis: Secondary | ICD-10-CM | POA: Diagnosis not present

## 2015-09-29 DIAGNOSIS — L218 Other seborrheic dermatitis: Secondary | ICD-10-CM | POA: Diagnosis not present

## 2015-09-29 DIAGNOSIS — M3501 Sicca syndrome with keratoconjunctivitis: Secondary | ICD-10-CM | POA: Diagnosis not present

## 2015-10-01 ENCOUNTER — Ambulatory Visit (INDEPENDENT_AMBULATORY_CARE_PROVIDER_SITE_OTHER): Payer: PPO | Admitting: Adult Health

## 2015-10-01 ENCOUNTER — Encounter: Payer: Self-pay | Admitting: Adult Health

## 2015-10-01 ENCOUNTER — Telehealth: Payer: Self-pay | Admitting: Acute Care

## 2015-10-01 VITALS — BP 104/84 | HR 94 | Temp 98.1°F | Ht 64.0 in | Wt 138.0 lb

## 2015-10-01 DIAGNOSIS — R05 Cough: Secondary | ICD-10-CM | POA: Diagnosis not present

## 2015-10-01 DIAGNOSIS — J4541 Moderate persistent asthma with (acute) exacerbation: Secondary | ICD-10-CM

## 2015-10-01 DIAGNOSIS — R059 Cough, unspecified: Secondary | ICD-10-CM

## 2015-10-01 MED ORDER — PREDNISONE 10 MG PO TABS: ORAL_TABLET | ORAL | Status: DC

## 2015-10-01 MED ORDER — METHYLPREDNISOLONE ACETATE 80 MG/ML IJ SUSP
120.0000 mg | Freq: Once | INTRAMUSCULAR | Status: AC
  Administered 2015-10-01: 120 mg via INTRAMUSCULAR

## 2015-10-01 MED ORDER — LEVALBUTEROL HCL 0.63 MG/3ML IN NEBU
0.6300 mg | INHALATION_SOLUTION | Freq: Once | RESPIRATORY_TRACT | Status: AC
  Administered 2015-10-01: 0.63 mg via RESPIRATORY_TRACT

## 2015-10-01 NOTE — Telephone Encounter (Signed)
Pt scheduled for appt with TP today at 3:15. Nothing further needed.

## 2015-10-01 NOTE — Telephone Encounter (Signed)
Pt states that she is not improving - still coughing up thick yellow/green sticky mucus, SOB, chest tightness and wheezing.  Using inhalers and nebs as directed. Has 1 tablet left of Zpak. Currently on 20mg  dose of Pred taper.  Unable to do any exertional activities without severe SOB. Hycodan is not giving much relief and Mucinex seems to not be working either.  Has appt scheduled for next week 10/12/15. Please advise Judson Roch. Thanks.

## 2015-10-01 NOTE — Telephone Encounter (Signed)
Pt. Needs appointment

## 2015-10-01 NOTE — Patient Instructions (Addendum)
Finish Zithromax.  Depo Medrol Injection today.  Taper Prednisone slowly over next 10 days.  Call back if you change mind on hospital admission  Please contact office for sooner follow up if symptoms do not improve or worsen or seek emergency care  Delsym 2 teaspoons twice daily as needed for cough Hydromet As needed  For cough , may make you sleepy.  Continue on Symbicort 2 puffs twice daily, rinse after use. Follow up in 2 weeks and As needed

## 2015-10-02 ENCOUNTER — Ambulatory Visit: Payer: PPO | Admitting: Adult Health

## 2015-10-02 NOTE — Progress Notes (Signed)
Subjective:    Patient ID: Toni Myers, female    DOB: 03-31-1951, 65 y.o.   MRN: [id number]  HPI 66 yo female never smoker with Sjogren's disease, , scoliosis w/ restriction on PFT , uveitis on methotrexate, chronic cough .   10/01/15 Acute OV : Cough  Pt presents for an acute office visit. Complains of prod cough with yellow/green colored mucus, chest congestion, SOB and wheezing x 1 week. Denies any sinus pressure/drainage, fever, nausea or vomiting. Currently taking zpak, pred taper, and mycelex.. Seen in office by Toni Myers , NP  On 09/28/15 .  CXR showed nad on 09/28/15 .  She has been on 2 abx recently with keflex and ampicillin  For skin infection .  She denies chest pain, orthopnea, edema or fever.     Past Medical History  Diagnosis Date  . Hypertension   . Cough   . Depression   . RLS (restless legs syndrome)   . Chronic low back pain   . Migraine   . Palpitations   . Sjogrens syndrome ([place])   . Anxiety   . Shingles   . Arthritis     Rheumatoid  . Asthma   . Chronic iritis, left eye   . GERD (gastroesophageal reflux disease)   . COPD (chronic obstructive pulmonary disease) ([place])     no o2, Dr.Robert Myers   . Dementia without behavioral disturbance    Current Outpatient Prescriptions on File Prior to Visit  Medication Sig Dispense Refill  . albuterol (PROAIR HFA) 108 (90 BASE) MCG/ACT inhaler Inhale 2 puffs into the lungs every 6 (six) hours as needed for wheezing or shortness of breath.     Marland Kitchen albuterol (PROVENTIL) (2.5 MG/3ML) 0.083% nebulizer solution Take 2.5 mg by nebulization every 4 (four) hours as needed for wheezing or shortness of breath.     Marland Kitchen azithromycin (ZITHROMAX) 250 MG tablet Take 2 today, then 1 daily until gone. 6 tablet 0  . budesonide-formoterol (SYMBICORT) 160-4.5 MCG/ACT inhaler Inhale 2 puffs into the lungs 2 (two) times daily as needed (SOB).     . calcium citrate (CALCITRATE - DOSED IN MG ELEMENTAL CALCIUM) 950 MG tablet Take 200 mg of elemental  calcium by mouth daily.    . clotrimazole (MYCELEX) 10 MG troche Take 1 tablet (10 mg total) by mouth 5 (five) times daily. 35 tablet 0  . cyclobenzaprine (FLEXERIL) 10 MG tablet Take 1 tablet (10 mg total) by mouth at bedtime as needed. For spasms 20 tablet 0  . escitalopram (LEXAPRO) 5 MG tablet Take 5 mg by mouth daily.    Marland Kitchen esomeprazole (NEXIUM 24HR) 20 MG capsule Take 20 mg by mouth daily at 12 noon.    Marland Kitchen FOLIC ACID PO Take 2 tablets by mouth daily.     Marland Kitchen HYDROcodone-homatropine (HYCODAN) 5-1.5 MG/5ML syrup Take 5 mLs by mouth every 6 (six) hours as needed for cough. 120 mL 0  . Methotrexate, PF, 25 MG/0.5ML SOAJ Inject 0.6 mLs into the skin once a week. Wednesday    . predniSONE (DELTASONE) 10 MG tablet 40mg X2 days, 30mg  X2 days, 20mg  X2 days, 10mg X2 days, then stop. 20 tablet 0  . PRESCRIPTION MEDICATION Place 1 drop into both eyes 4 (four) times daily. Eye drop made from pt's own blood     No current facility-administered medications on file prior to visit.       Review of Systems    .Constitutional:   No  weight loss, night sweats,  Fevers,  chills,+ fatigue, or  lassitude.  HEENT:   No headaches,  Difficulty swallowing,  Tooth/dental problems, or  Sore throat,                No sneezing, itching, ear ache,  +nasal congestion, post nasal drip,   CV:  No chest pain,  Orthopnea, PND, swelling in lower extremities, anasarca, dizziness, palpitations, syncope.   GI  No heartburn, indigestion, abdominal pain, nausea, vomiting, diarrhea, change in bowel habits, loss of appetite, bloody stools.   Resp:   No chest wall deformity  Skin: no rash or lesions.  GU: no dysuria, change in color of urine, no urgency or frequency.  No flank pain, no hematuria   MS:  No joint pain or swelling.  No decreased range of motion.  No back pain.  Psych:  No change in mood or affect. No depression or anxiety.  No memory loss.       Objective:   Physical Exam Filed Vitals:   10/01/15 1451    BP: 104/84  Pulse: 94  Temp: 98.1 F (36.7 C)  TempSrc: Oral  Height: 5\' 4"  (1.626 m)  Weight: 138 lb (62.596 kg)  SpO2: 94%   GEN: A/Ox3; pleasant , NAD, well nourished   HEENT:  Toni Myers/AT,  EACs-clear, TMs-wnl, NOSE-clear, THROAT-clear, no lesions, no postnasal drip or exudate noted.   NECK:  Supple w/ fair ROM; no JVD; normal carotid impulses w/o bruits; no thyromegaly or nodules palpated; no lymphadenopathy.no stridor   RESP Exp wheezing , no accessory muscle use, no dullness to percussion  CARD:  RRR, no m/r/g  , no peripheral edema, pulses intact, no cyanosis or clubbing.  GI:   Soft & nt; nml bowel sounds; no organomegaly or masses detected.  Musco: Warm bil, no deformities or joint swelling noted.   Neuro: alert, no focal deficits noted.    Skin: Warm, no lesions or rashes  Toni Matera NP-C  [facility]  10/01/15        Assessment & Plan:

## 2015-10-02 NOTE — Assessment & Plan Note (Signed)
Slow to resolve flare  Advised that she has been on several abx recently and steroids that hospitalization is indicated She declines . Advised of dangers. She says she absolutely will not go to hosptial  xopenex neb and Depo medrol injection given in office   Plan  Finish Zithromax.  Depo Medrol Injection today.  Taper Prednisone slowly over next 10 days.  Call back if you change mind on hospital admission  Please contact office for sooner follow up if symptoms do not improve or worsen or seek emergency care  Delsym 2 teaspoons twice daily as needed for cough Hydromet As needed  For cough , may make you sleepy.  Continue on Symbicort 2 puffs twice daily, rinse after use. Follow up in 2 weeks and As needed

## 2015-10-05 ENCOUNTER — Encounter (HOSPITAL_COMMUNITY): Payer: Self-pay | Admitting: Emergency Medicine

## 2015-10-05 ENCOUNTER — Telehealth: Payer: Self-pay | Admitting: Internal Medicine

## 2015-10-05 DIAGNOSIS — H209 Unspecified iridocyclitis: Secondary | ICD-10-CM | POA: Diagnosis not present

## 2015-10-05 DIAGNOSIS — Z8261 Family history of arthritis: Secondary | ICD-10-CM

## 2015-10-05 DIAGNOSIS — Z7952 Long term (current) use of systemic steroids: Secondary | ICD-10-CM | POA: Diagnosis not present

## 2015-10-05 DIAGNOSIS — J9600 Acute respiratory failure, unspecified whether with hypoxia or hypercapnia: Secondary | ICD-10-CM | POA: Diagnosis not present

## 2015-10-05 DIAGNOSIS — R002 Palpitations: Secondary | ICD-10-CM | POA: Diagnosis not present

## 2015-10-05 DIAGNOSIS — Z79899 Other long term (current) drug therapy: Secondary | ICD-10-CM | POA: Diagnosis not present

## 2015-10-05 DIAGNOSIS — M545 Low back pain: Secondary | ICD-10-CM | POA: Diagnosis present

## 2015-10-05 DIAGNOSIS — R05 Cough: Secondary | ICD-10-CM | POA: Diagnosis not present

## 2015-10-05 DIAGNOSIS — K219 Gastro-esophageal reflux disease without esophagitis: Secondary | ICD-10-CM | POA: Diagnosis present

## 2015-10-05 DIAGNOSIS — J9601 Acute respiratory failure with hypoxia: Secondary | ICD-10-CM | POA: Diagnosis present

## 2015-10-05 DIAGNOSIS — M3502 Sicca syndrome with lung involvement: Secondary | ICD-10-CM | POA: Diagnosis not present

## 2015-10-05 DIAGNOSIS — Z6824 Body mass index (BMI) 24.0-24.9, adult: Secondary | ICD-10-CM | POA: Diagnosis not present

## 2015-10-05 DIAGNOSIS — I272 Other secondary pulmonary hypertension: Secondary | ICD-10-CM | POA: Diagnosis not present

## 2015-10-05 DIAGNOSIS — J45901 Unspecified asthma with (acute) exacerbation: Secondary | ICD-10-CM | POA: Diagnosis not present

## 2015-10-05 DIAGNOSIS — F329 Major depressive disorder, single episode, unspecified: Secondary | ICD-10-CM | POA: Diagnosis not present

## 2015-10-05 DIAGNOSIS — M419 Scoliosis, unspecified: Secondary | ICD-10-CM | POA: Diagnosis not present

## 2015-10-05 DIAGNOSIS — J4521 Mild intermittent asthma with (acute) exacerbation: Secondary | ICD-10-CM | POA: Diagnosis not present

## 2015-10-05 DIAGNOSIS — J441 Chronic obstructive pulmonary disease with (acute) exacerbation: Secondary | ICD-10-CM | POA: Diagnosis present

## 2015-10-05 DIAGNOSIS — I10 Essential (primary) hypertension: Secondary | ICD-10-CM | POA: Diagnosis not present

## 2015-10-05 DIAGNOSIS — F0390 Unspecified dementia without behavioral disturbance: Secondary | ICD-10-CM | POA: Diagnosis present

## 2015-10-05 DIAGNOSIS — G8929 Other chronic pain: Secondary | ICD-10-CM | POA: Diagnosis present

## 2015-10-05 DIAGNOSIS — Z7951 Long term (current) use of inhaled steroids: Secondary | ICD-10-CM

## 2015-10-05 DIAGNOSIS — R062 Wheezing: Secondary | ICD-10-CM | POA: Diagnosis not present

## 2015-10-05 DIAGNOSIS — Z885 Allergy status to narcotic agent status: Secondary | ICD-10-CM

## 2015-10-05 DIAGNOSIS — M3500 Sicca syndrome, unspecified: Secondary | ICD-10-CM | POA: Diagnosis not present

## 2015-10-05 DIAGNOSIS — J209 Acute bronchitis, unspecified: Secondary | ICD-10-CM | POA: Diagnosis present

## 2015-10-05 DIAGNOSIS — J4540 Moderate persistent asthma, uncomplicated: Secondary | ICD-10-CM | POA: Diagnosis not present

## 2015-10-05 DIAGNOSIS — F419 Anxiety disorder, unspecified: Secondary | ICD-10-CM | POA: Diagnosis not present

## 2015-10-05 DIAGNOSIS — N182 Chronic kidney disease, stage 2 (mild): Secondary | ICD-10-CM | POA: Diagnosis not present

## 2015-10-05 DIAGNOSIS — Z8709 Personal history of other diseases of the respiratory system: Secondary | ICD-10-CM | POA: Diagnosis not present

## 2015-10-05 DIAGNOSIS — J9801 Acute bronchospasm: Secondary | ICD-10-CM | POA: Diagnosis not present

## 2015-10-05 DIAGNOSIS — G2581 Restless legs syndrome: Secondary | ICD-10-CM | POA: Diagnosis not present

## 2015-10-05 DIAGNOSIS — M069 Rheumatoid arthritis, unspecified: Secondary | ICD-10-CM | POA: Diagnosis present

## 2015-10-05 DIAGNOSIS — R0789 Other chest pain: Secondary | ICD-10-CM | POA: Diagnosis not present

## 2015-10-05 DIAGNOSIS — R0600 Dyspnea, unspecified: Secondary | ICD-10-CM | POA: Diagnosis not present

## 2015-10-05 DIAGNOSIS — R0989 Other specified symptoms and signs involving the circulatory and respiratory systems: Secondary | ICD-10-CM | POA: Diagnosis not present

## 2015-10-05 DIAGNOSIS — R0602 Shortness of breath: Secondary | ICD-10-CM | POA: Diagnosis not present

## 2015-10-05 DIAGNOSIS — I129 Hypertensive chronic kidney disease with stage 1 through stage 4 chronic kidney disease, or unspecified chronic kidney disease: Secondary | ICD-10-CM | POA: Diagnosis not present

## 2015-10-05 DIAGNOSIS — J4550 Severe persistent asthma, uncomplicated: Secondary | ICD-10-CM | POA: Diagnosis not present

## 2015-10-05 MED ORDER — ALBUTEROL SULFATE (2.5 MG/3ML) 0.083% IN NEBU
INHALATION_SOLUTION | RESPIRATORY_TRACT | Status: AC
  Filled 2015-10-05: qty 6

## 2015-10-05 MED ORDER — ALBUTEROL SULFATE (2.5 MG/3ML) 0.083% IN NEBU
5.0000 mg | INHALATION_SOLUTION | Freq: Once | RESPIRATORY_TRACT | Status: AC
  Administered 2015-10-05: 5 mg via RESPIRATORY_TRACT

## 2015-10-05 NOTE — ED Notes (Signed)
Pt. reports persistent productive cough with chest congestion onset last week , seen by her PCP diagnosed with bronchitis . Denies fever or chills.

## 2015-10-05 NOTE — Telephone Encounter (Signed)
Call from Jaclyn Prime saying she spokle to DrYacoub several hours ago when he was in the  3-11pm elink shift and was advised to go to ER . She is now in ER and is paging. I spoke to Dr Maryan Rued of ER adn advised her to independent eval. Explained to patient who verbalized understanding  Dr. Brand Males, M.D., [facility].C.P Pulmonary and Critical Care Medicine Staff Physician [facility] Pager: [phone], If no answer or between  15:00h - 7:00h: call [phone]  10/05/2015 11:36 PM

## 2015-10-06 ENCOUNTER — Inpatient Hospital Stay (HOSPITAL_COMMUNITY)
Admission: EM | Admit: 2015-10-06 | Discharge: 2015-10-15 | DRG: 202 | Disposition: A | Discharge status: Home / Self Care | Payer: PPO | Attending: Internal Medicine | Admitting: Internal Medicine

## 2015-10-06 ENCOUNTER — Encounter (HOSPITAL_COMMUNITY): Payer: Self-pay | Admitting: Internal Medicine

## 2015-10-06 ENCOUNTER — Emergency Department (HOSPITAL_COMMUNITY): Payer: PPO

## 2015-10-06 DIAGNOSIS — Z7951 Long term (current) use of inhaled steroids: Secondary | ICD-10-CM | POA: Diagnosis not present

## 2015-10-06 DIAGNOSIS — M419 Scoliosis, unspecified: Secondary | ICD-10-CM | POA: Diagnosis not present

## 2015-10-06 DIAGNOSIS — J9601 Acute respiratory failure with hypoxia: Secondary | ICD-10-CM | POA: Diagnosis not present

## 2015-10-06 DIAGNOSIS — Z6824 Body mass index (BMI) 24.0-24.9, adult: Secondary | ICD-10-CM | POA: Diagnosis not present

## 2015-10-06 DIAGNOSIS — I10 Essential (primary) hypertension: Secondary | ICD-10-CM | POA: Diagnosis not present

## 2015-10-06 DIAGNOSIS — R0602 Shortness of breath: Secondary | ICD-10-CM | POA: Diagnosis not present

## 2015-10-06 DIAGNOSIS — J4550 Severe persistent asthma, uncomplicated: Secondary | ICD-10-CM

## 2015-10-06 DIAGNOSIS — J209 Acute bronchitis, unspecified: Secondary | ICD-10-CM | POA: Insufficient documentation

## 2015-10-06 DIAGNOSIS — R0600 Dyspnea, unspecified: Secondary | ICD-10-CM | POA: Diagnosis not present

## 2015-10-06 DIAGNOSIS — R0989 Other specified symptoms and signs involving the circulatory and respiratory systems: Secondary | ICD-10-CM | POA: Insufficient documentation

## 2015-10-06 DIAGNOSIS — J9600 Acute respiratory failure, unspecified whether with hypoxia or hypercapnia: Secondary | ICD-10-CM | POA: Diagnosis not present

## 2015-10-06 DIAGNOSIS — J9801 Acute bronchospasm: Secondary | ICD-10-CM

## 2015-10-06 DIAGNOSIS — H209 Unspecified iridocyclitis: Secondary | ICD-10-CM | POA: Diagnosis not present

## 2015-10-06 DIAGNOSIS — R002 Palpitations: Secondary | ICD-10-CM | POA: Diagnosis present

## 2015-10-06 DIAGNOSIS — R062 Wheezing: Secondary | ICD-10-CM | POA: Diagnosis not present

## 2015-10-06 DIAGNOSIS — R05 Cough: Secondary | ICD-10-CM | POA: Diagnosis present

## 2015-10-06 DIAGNOSIS — I272 Other secondary pulmonary hypertension: Secondary | ICD-10-CM | POA: Diagnosis not present

## 2015-10-06 DIAGNOSIS — J45901 Unspecified asthma with (acute) exacerbation: Secondary | ICD-10-CM | POA: Diagnosis not present

## 2015-10-06 DIAGNOSIS — R059 Cough, unspecified: Secondary | ICD-10-CM | POA: Diagnosis present

## 2015-10-06 DIAGNOSIS — N182 Chronic kidney disease, stage 2 (mild): Secondary | ICD-10-CM | POA: Diagnosis present

## 2015-10-06 DIAGNOSIS — M3500 Sicca syndrome, unspecified: Secondary | ICD-10-CM | POA: Diagnosis not present

## 2015-10-06 DIAGNOSIS — Z885 Allergy status to narcotic agent status: Secondary | ICD-10-CM | POA: Diagnosis not present

## 2015-10-06 DIAGNOSIS — F419 Anxiety disorder, unspecified: Secondary | ICD-10-CM | POA: Diagnosis not present

## 2015-10-06 DIAGNOSIS — M3502 Sicca syndrome with lung involvement: Secondary | ICD-10-CM | POA: Diagnosis not present

## 2015-10-06 DIAGNOSIS — M069 Rheumatoid arthritis, unspecified: Secondary | ICD-10-CM | POA: Diagnosis present

## 2015-10-06 DIAGNOSIS — Z8261 Family history of arthritis: Secondary | ICD-10-CM | POA: Diagnosis not present

## 2015-10-06 DIAGNOSIS — R7989 Other specified abnormal findings of blood chemistry: Secondary | ICD-10-CM | POA: Diagnosis present

## 2015-10-06 DIAGNOSIS — F0390 Unspecified dementia without behavioral disturbance: Secondary | ICD-10-CM | POA: Diagnosis not present

## 2015-10-06 DIAGNOSIS — J4540 Moderate persistent asthma, uncomplicated: Secondary | ICD-10-CM | POA: Diagnosis not present

## 2015-10-06 DIAGNOSIS — G2581 Restless legs syndrome: Secondary | ICD-10-CM | POA: Diagnosis not present

## 2015-10-06 DIAGNOSIS — R0789 Other chest pain: Secondary | ICD-10-CM | POA: Diagnosis not present

## 2015-10-06 DIAGNOSIS — J45909 Unspecified asthma, uncomplicated: Secondary | ICD-10-CM | POA: Diagnosis present

## 2015-10-06 DIAGNOSIS — M545 Low back pain: Secondary | ICD-10-CM | POA: Diagnosis not present

## 2015-10-06 DIAGNOSIS — K219 Gastro-esophageal reflux disease without esophagitis: Secondary | ICD-10-CM | POA: Diagnosis not present

## 2015-10-06 DIAGNOSIS — Z7952 Long term (current) use of systemic steroids: Secondary | ICD-10-CM | POA: Diagnosis not present

## 2015-10-06 DIAGNOSIS — J441 Chronic obstructive pulmonary disease with (acute) exacerbation: Secondary | ICD-10-CM | POA: Diagnosis present

## 2015-10-06 DIAGNOSIS — J4521 Mild intermittent asthma with (acute) exacerbation: Secondary | ICD-10-CM | POA: Diagnosis not present

## 2015-10-06 DIAGNOSIS — Z8709 Personal history of other diseases of the respiratory system: Secondary | ICD-10-CM | POA: Diagnosis not present

## 2015-10-06 DIAGNOSIS — G8929 Other chronic pain: Secondary | ICD-10-CM | POA: Diagnosis not present

## 2015-10-06 DIAGNOSIS — Z79899 Other long term (current) drug therapy: Secondary | ICD-10-CM | POA: Diagnosis not present

## 2015-10-06 DIAGNOSIS — R058 Other specified cough: Secondary | ICD-10-CM

## 2015-10-06 DIAGNOSIS — I129 Hypertensive chronic kidney disease with stage 1 through stage 4 chronic kidney disease, or unspecified chronic kidney disease: Secondary | ICD-10-CM | POA: Diagnosis not present

## 2015-10-06 DIAGNOSIS — F329 Major depressive disorder, single episode, unspecified: Secondary | ICD-10-CM | POA: Diagnosis not present

## 2015-10-06 HISTORY — DX: Cardiac murmur, unspecified: R01.1

## 2015-10-06 HISTORY — DX: Chronic kidney disease, stage 2 (mild): N18.2

## 2015-10-06 HISTORY — DX: Other specified fracture of unspecified pubis, initial encounter for closed fracture: S32.599A

## 2015-10-06 HISTORY — DX: Reserved for inherently not codable concepts without codable children: IMO0001

## 2015-10-06 LAB — BASIC METABOLIC PANEL
Anion gap: 10 (ref 5–15)
BUN: 15 mg/dL (ref 6–20)
CO2: 26 mmol/L (ref 22–32)
CREATININE: 0.83 mg/dL (ref 0.44–1.00)
Calcium: 10.1 mg/dL (ref 8.9–10.3)
Chloride: 100 mmol/L — ABNORMAL LOW (ref 101–111)
GFR calc Af Amer: >60 mL/min (ref >60)
GLUCOSE: 100 mg/dL — AB (ref 65–99)
Potassium: 4.6 mmol/L (ref 3.5–5.1)
SODIUM: 136 mmol/L (ref 135–145)

## 2015-10-06 LAB — COMPREHENSIVE METABOLIC PANEL
ALBUMIN: 4.1 g/dL (ref 3.5–5.0)
ALT: 17 U/L (ref 14–54)
ANION GAP: 13 (ref 5–15)
AST: 19 U/L (ref 15–41)
Alkaline Phosphatase: 77 U/L (ref 38–126)
BILIRUBIN TOTAL: 0.8 mg/dL (ref 0.3–1.2)
BUN: 13 mg/dL (ref 6–20)
CO2: 24 mmol/L (ref 22–32)
Calcium: 9.5 mg/dL (ref 8.9–10.3)
Chloride: 99 mmol/L — ABNORMAL LOW (ref 101–111)
Creatinine, Ser: 0.83 mg/dL (ref 0.44–1.00)
GFR calc Af Amer: >60 mL/min (ref >60)
GFR calc non Af Amer: >60 mL/min (ref >60)
GLUCOSE: 146 mg/dL — AB (ref 65–99)
POTASSIUM: 4.3 mmol/L (ref 3.5–5.1)
SODIUM: 136 mmol/L (ref 135–145)
TOTAL PROTEIN: 7.2 g/dL (ref 6.5–8.1)

## 2015-10-06 LAB — I-STAT VENOUS BLOOD GAS, ED
ACID-BASE DEFICIT: 1 mmol/L (ref 0.0–2.0)
Bicarbonate: 21.6 mEq/L (ref 20.0–24.0)
O2 Saturation: 100 %
PCO2 VEN: 29.2 mmHg — AB (ref 45.0–50.0)
PO2 VEN: 156 mmHg — AB (ref 31.0–45.0)
TCO2: 23 mmol/L (ref 0–100)
pH, Ven: 7.478 — ABNORMAL HIGH (ref 7.250–7.300)

## 2015-10-06 LAB — CBC WITH DIFFERENTIAL/PLATELET
BASOS ABS: 0 10*3/uL (ref 0.0–0.1)
Basophils Relative: 0 %
Eosinophils Absolute: 0 10*3/uL (ref 0.0–0.7)
Eosinophils Relative: 0 %
HEMATOCRIT: 36.4 % (ref 36.0–46.0)
Hemoglobin: 12.5 g/dL (ref 12.0–15.0)
LYMPHS PCT: 5 %
Lymphs Abs: 0.4 10*3/uL — ABNORMAL LOW (ref 0.7–4.0)
MCH: 32.8 pg (ref 26.0–34.0)
MCHC: 34.3 g/dL (ref 30.0–36.0)
MCV: 95.5 fL (ref 78.0–100.0)
MONO ABS: 0.2 10*3/uL (ref 0.1–1.0)
Monocytes Relative: 2 %
NEUTROS ABS: 8.6 10*3/uL — AB (ref 1.7–7.7)
Neutrophils Relative %: 93 %
Platelets: 401 10*3/uL — ABNORMAL HIGH (ref 150–400)
RBC: 3.81 MIL/uL — AB (ref 3.87–5.11)
RDW: 15.4 % (ref 11.5–15.5)
WBC: 9.2 10*3/uL (ref 4.0–10.5)

## 2015-10-06 LAB — CBC
HEMATOCRIT: 39.8 % (ref 36.0–46.0)
Hemoglobin: 13.6 g/dL (ref 12.0–15.0)
MCH: 32.9 pg (ref 26.0–34.0)
MCHC: 34.2 g/dL (ref 30.0–36.0)
MCV: 96.1 fL (ref 78.0–100.0)
PLATELETS: 463 10*3/uL — AB (ref 150–400)
RBC: 4.14 MIL/uL (ref 3.87–5.11)
RDW: 15.4 % (ref 11.5–15.5)
WBC: 10.5 10*3/uL (ref 4.0–10.5)

## 2015-10-06 LAB — TROPONIN I: Troponin I: <0.03 ng/mL (ref <0.031)

## 2015-10-06 LAB — TSH: TSH: 0.114 u[IU]/mL — ABNORMAL LOW (ref 0.350–4.500)

## 2015-10-06 MED ORDER — ALBUTEROL SULFATE (2.5 MG/3ML) 0.083% IN NEBU
2.5000 mg | INHALATION_SOLUTION | Freq: Three times a day (TID) | RESPIRATORY_TRACT | Status: DC
  Administered 2015-10-06 – 2015-10-15 (×26): 2.5 mg via RESPIRATORY_TRACT
  Filled 2015-10-06 (×26): qty 3

## 2015-10-06 MED ORDER — METHYLPREDNISOLONE SODIUM SUCC 125 MG IJ SOLR
60.0000 mg | Freq: Once | INTRAMUSCULAR | Status: AC
  Administered 2015-10-06: 60 mg via INTRAVENOUS
  Filled 2015-10-06: qty 2

## 2015-10-06 MED ORDER — PANTOPRAZOLE SODIUM 40 MG PO TBEC: 40.0000 mg | DELAYED_RELEASE_TABLET | Freq: Every day | ORAL | Status: DC

## 2015-10-06 MED ORDER — CALCIUM CITRATE 950 (200 CA) MG PO TABS
200.0000 mg | ORAL_TABLET | Freq: Every day | ORAL | Status: DC
  Administered 2015-10-06 – 2015-10-15 (×9): 200 mg via ORAL
  Filled 2015-10-06 (×12): qty 1

## 2015-10-06 MED ORDER — AMLODIPINE BESYLATE 5 MG PO TABS: 5.0000 mg | ORAL_TABLET | Freq: Every day | ORAL | Status: DC

## 2015-10-06 MED ORDER — ACETAMINOPHEN 325 MG PO TABS: 650.0000 mg | ORAL_TABLET | Freq: Four times a day (QID) | ORAL | Status: DC | PRN

## 2015-10-06 MED ORDER — IPRATROPIUM BROMIDE 0.02 % IN SOLN
1.0000 mg | Freq: Once | RESPIRATORY_TRACT | Status: AC
Start: 2015-10-06 — End: 2015-10-06
  Administered 2015-10-06: 1 mg via RESPIRATORY_TRACT
  Filled 2015-10-06: qty 5

## 2015-10-06 MED ORDER — CYCLOBENZAPRINE HCL 10 MG PO TABS: 10.0000 mg | ORAL_TABLET | Freq: Every evening | ORAL | Status: DC | PRN

## 2015-10-06 MED ORDER — MAGNESIUM SULFATE 2 GM/50ML IV SOLN
2.0000 g | Freq: Once | INTRAVENOUS | Status: AC
  Administered 2015-10-06: 2 g via INTRAVENOUS
  Filled 2015-10-06: qty 50

## 2015-10-06 MED ORDER — CYCLOSPORINE 0.05 % OP EMUL
2.0000 [drp] | Freq: Every day | OPHTHALMIC | Status: DC
  Administered 2015-10-10 – 2015-10-14 (×2): 2 [drp] via OPHTHALMIC
  Filled 2015-10-06 (×10): qty 1

## 2015-10-06 MED ORDER — METHOTREXATE (PF) 25 MG/0.5ML ~~LOC~~ SOAJ: 0.6000 mL | SUBCUTANEOUS | Status: DC

## 2015-10-06 MED ORDER — ONDANSETRON HCL 4 MG/2ML IJ SOLN: 4.0000 mg | Freq: Four times a day (QID) | INTRAMUSCULAR | Status: DC | PRN

## 2015-10-06 MED ORDER — LEVOFLOXACIN IN D5W 750 MG/150ML IV SOLN
750.0000 mg | INTRAVENOUS | Status: DC
  Administered 2015-10-06 – 2015-10-09 (×4): 750 mg via INTRAVENOUS
  Filled 2015-10-06 (×5): qty 150

## 2015-10-06 MED ORDER — BUDESONIDE 0.5 MG/2ML IN SUSP
0.2500 mg | Freq: Two times a day (BID) | RESPIRATORY_TRACT | Status: DC
  Administered 2015-10-06 – 2015-10-08 (×4): 0.25 mg via RESPIRATORY_TRACT
  Filled 2015-10-06 (×6): qty 2

## 2015-10-06 MED ORDER — METHOTREXATE SODIUM CHEMO INJECTION 50 MG/2ML
15.0000 mg | INTRAMUSCULAR | Status: DC
  Administered 2015-10-07 – 2015-10-14 (×2): 15 mg via SUBCUTANEOUS

## 2015-10-06 MED ORDER — ALBUTEROL SULFATE (2.5 MG/3ML) 0.083% IN NEBU
2.5000 mg | INHALATION_SOLUTION | RESPIRATORY_TRACT | Status: DC | PRN
  Filled 2015-10-06 (×2): qty 3

## 2015-10-06 MED ORDER — ONDANSETRON HCL 4 MG PO TABS: 4.0000 mg | ORAL_TABLET | Freq: Four times a day (QID) | ORAL | Status: DC | PRN

## 2015-10-06 MED ORDER — ESCITALOPRAM OXALATE 10 MG PO TABS
5.0000 mg | ORAL_TABLET | Freq: Every day | ORAL | Status: DC
Start: 2015-10-06 — End: 2015-10-15
  Administered 2015-10-06 – 2015-10-15 (×10): 5 mg via ORAL
  Filled 2015-10-06 (×10): qty 1

## 2015-10-06 MED ORDER — METHYLPREDNISOLONE SODIUM SUCC 40 MG IJ SOLR
40.0000 mg | Freq: Two times a day (BID) | INTRAMUSCULAR | Status: DC
  Administered 2015-10-06 – 2015-10-09 (×6): 40 mg via INTRAVENOUS
  Filled 2015-10-06 (×6): qty 1

## 2015-10-06 MED ORDER — ALBUTEROL SULFATE (2.5 MG/3ML) 0.083% IN NEBU
2.5000 mg | INHALATION_SOLUTION | RESPIRATORY_TRACT | Status: DC
  Administered 2015-10-06 (×2): 2.5 mg via RESPIRATORY_TRACT
  Filled 2015-10-06 (×2): qty 3

## 2015-10-06 MED ORDER — GUAIFENESIN 100 MG/5ML PO SOLN
5.0000 mL | ORAL | Status: DC | PRN
  Administered 2015-10-06 – 2015-10-13 (×6): 100 mg via ORAL
  Filled 2015-10-06 (×6): qty 5

## 2015-10-06 MED ORDER — FOLIC ACID 1 MG PO TABS
1.0000 mg | ORAL_TABLET | Freq: Every day | ORAL | Status: DC
  Administered 2015-10-07: 1 mg via ORAL
  Filled 2015-10-06 (×2): qty 1

## 2015-10-06 MED ORDER — ENOXAPARIN SODIUM 40 MG/0.4ML ~~LOC~~ SOLN
40.0000 mg | SUBCUTANEOUS | Status: DC
  Administered 2015-10-06 – 2015-10-12 (×7): 40 mg via SUBCUTANEOUS
  Filled 2015-10-06 (×8): qty 0.4

## 2015-10-06 MED ORDER — HYDROCODONE-HOMATROPINE 5-1.5 MG/5ML PO SYRP
5.0000 mL | ORAL_SOLUTION | Freq: Four times a day (QID) | ORAL | Status: DC | PRN
Start: 2015-10-06 — End: 2015-10-09
  Administered 2015-10-06 – 2015-10-09 (×4): 5 mL via ORAL
  Filled 2015-10-06 (×4): qty 5

## 2015-10-06 MED ORDER — AMLODIPINE BESYLATE 5 MG PO TABS
5.0000 mg | ORAL_TABLET | Freq: Every day | ORAL | Status: DC
  Administered 2015-10-06 – 2015-10-14 (×9): 5 mg via ORAL
  Filled 2015-10-06 (×9): qty 1

## 2015-10-06 MED ORDER — ESCITALOPRAM OXALATE 10 MG PO TABS: 5.0000 mg | ORAL_TABLET | Freq: Every day | ORAL | Status: DC

## 2015-10-06 MED ORDER — PANTOPRAZOLE SODIUM 40 MG PO TBEC
40.0000 mg | DELAYED_RELEASE_TABLET | Freq: Every day | ORAL | Status: DC
  Administered 2015-10-06 – 2015-10-15 (×10): 40 mg via ORAL
  Filled 2015-10-06 (×10): qty 1

## 2015-10-06 MED ORDER — ACETAMINOPHEN 650 MG RE SUPP: 650.0000 mg | Freq: Four times a day (QID) | RECTAL | Status: DC | PRN

## 2015-10-06 NOTE — ED Notes (Signed)
Attempted report 

## 2015-10-06 NOTE — ED Notes (Signed)
Ordered diet tray 

## 2015-10-06 NOTE — ED Provider Notes (Signed)
CSN: [id number]     Arrival date & time 10/05/15  2335 History  By signing my name below, I, Altamease Oiler, attest that this documentation has been prepared under the direction and in the presence of Everlene Balls, MD. Electronically Signed: Altamease Oiler, ED Scribe. 10/06/2015. 3:00 AM   Chief Complaint  Patient presents with  . Cough  . Shortness of Breath   The history is provided by the patient. No language interpreter was used.   Toni Myers is a 65 y.o. female with PMHx of dementia, COPD, asthma, and HTN who presents to the Emergency Department complaining of constant and worsening SOB with onset 6 days ago. Pt states that she was seen by her pulmonologist late last week who wanted to admit her but she refused in deference to the holiday. Over the last 24 hours she has had 5 albuterol nebulizer breathing treatments without sufficient relief. Additionally she is on prednisone and z-pak and has had kenalog injection without improvement. Associated symptoms include a cough productive of yellow sputum. Pt denies fever and chest pain. She sees [facility] Lamonte Sakai at [facility].   Past Medical History  Diagnosis Date  . Hypertension   . Cough   . Depression   . RLS (restless legs syndrome)   . Chronic low back pain   . Migraine   . Palpitations   . Sjogrens syndrome ([place])   . Anxiety   . Shingles   . Arthritis     Rheumatoid  . Asthma   . Chronic iritis, left eye   . GERD (gastroesophageal reflux disease)   . COPD (chronic obstructive pulmonary disease) ([place])     no o2, Dr.Robert Byrum   . Dementia without behavioral disturbance    Past Surgical History  Procedure Laterality Date  . Tubal ligation    . Back surgery      07/1998 and 03/1999, herniated disc, 2 rod and 4 screws  . Cardiac catheterization      shadow no blockage  . Esophagogastroduodenoscopy (egd) with propofol N/A 02/09/2015    Procedure: ESOPHAGOGASTRODUODENOSCOPY (EGD) WITH PROPOFOL;  Surgeon: Garlan Fair, MD;  Location: [facility] ENDOSCOPY;  Service: Endoscopy;  Laterality: N/A;  . Colonoscopy with propofol N/A 02/09/2015    Procedure: COLONOSCOPY WITH PROPOFOL;  Surgeon: Garlan Fair, MD;  Location: [facility] ENDOSCOPY;  Service: Endoscopy;  Laterality: N/A;   Family History  Problem Relation Age of Onset  . Arthritis Mother    Social History  Substance Use Topics  . Smoking status: Never Smoker   . Smokeless tobacco: Never Used  . Alcohol Use: Yes     Comment: 2 glasses monthly   OB History    Gravida Para Term Preterm AB TAB SAB Ectopic Multiple Living   2 2 2             Review of Systems  10 Systems reviewed and all are negative for acute change except as noted in the HPI.  Allergies  Morphine and related  Home Medications   Prior to Admission medications   Medication Sig Start Date End Date Taking? Authorizing Provider  albuterol (PROAIR HFA) 108 (90 BASE) MCG/ACT inhaler Inhale 2 puffs into the lungs every 6 (six) hours as needed for wheezing or shortness of breath.     Historical Provider, MD  albuterol (PROVENTIL) (2.5 MG/3ML) 0.083% nebulizer solution Take 2.5 mg by nebulization every 4 (four) hours as needed for wheezing or shortness of breath.     Historical Provider,  MD  azithromycin (ZITHROMAX) 250 MG tablet Take 2 today, then 1 daily until gone. 09/28/15   Magdalen Spatz, NP  budesonide-formoterol [facility]) 160-4.5 MCG/ACT inhaler Inhale 2 puffs into the lungs 2 (two) times daily as needed (SOB).     Historical Provider, MD  calcium citrate (CALCITRATE - DOSED IN MG ELEMENTAL CALCIUM) 950 MG tablet Take 200 mg of elemental calcium by mouth daily.    Historical Provider, MD  clotrimazole (MYCELEX) 10 MG troche Take 1 tablet (10 mg total) by mouth 5 (five) times daily. 09/28/15   Magdalen Spatz, NP  cyclobenzaprine (FLEXERIL) 10 MG tablet Take 1 tablet (10 mg total) by mouth at bedtime as needed. For spasms 07/22/13   Janece Canterbury, MD  escitalopram (LEXAPRO) 5 MG tablet  Take 5 mg by mouth daily.    Historical Provider, MD  esomeprazole (NEXIUM 24HR) 20 MG capsule Take 20 mg by mouth daily at 12 noon.    Historical Provider, MD  FOLIC ACID PO Take 2 tablets by mouth daily.     Historical Provider, MD  HYDROcodone-homatropine (HYCODAN) 5-1.5 MG/5ML syrup Take 5 mLs by mouth every 6 (six) hours as needed for cough. 09/28/15   Magdalen Spatz, NP  Methotrexate, PF, 25 MG/0.5ML SOAJ Inject 0.6 mLs into the skin once a week. Wednesday    Historical Provider, MD  predniSONE (DELTASONE) 10 MG tablet 40mg X2 days, 30mg  X2 days, 20mg  X2 days, 10mg X2 days, then stop. 09/28/15   Magdalen Spatz, NP  predniSONE (DELTASONE) 10 MG tablet 4 tabs for 3 days, then 3 tabs for 3 days, 2 tabs for 3 days, then 1 tab for 3 days, then stop 10/01/15   Melvenia Needles, NP  PRESCRIPTION MEDICATION Place 1 drop into both eyes 4 (four) times daily. Eye drop made from pt's own blood    Historical Provider, MD   BP 159/102 mmHg  Pulse 127  Temp(Src) 98.2 F (36.8 C) (Oral)  Resp 22  Wt 136 lb 6.4 oz (61.871 kg)  SpO2 96% Physical Exam  Constitutional: She is oriented to person, place, and time. She appears well-developed and well-nourished. She appears distressed.  HENT:  Head: Normocephalic and atraumatic.  Nose: Nose normal.  Mouth/Throat: Oropharynx is clear and moist. No oropharyngeal exudate.  Eyes: Conjunctivae and EOM are normal. Pupils are equal, round, and reactive to light. No scleral icterus.  Neck: Normal range of motion. Neck supple. No JVD present. No tracheal deviation present. No thyromegaly present.  Cardiovascular: Regular rhythm and normal heart sounds.  Tachycardia present.  Exam reveals no gallop and no friction rub.   No murmur heard. Pulmonary/Chest: Tachypnea noted. She is in respiratory distress. She has wheezes. She exhibits no tenderness.  Increased WOB   Abdominal: Soft. Bowel sounds are normal. She exhibits no distension and no mass. There is no tenderness. There is  no rebound and no guarding.  Musculoskeletal: Normal range of motion. She exhibits no edema or tenderness.  Lymphadenopathy:    She has no cervical adenopathy.  Neurological: She is alert and oriented to person, place, and time. No cranial nerve deficit. She exhibits normal muscle tone.  Skin: Skin is warm and dry. No rash noted. No erythema. No pallor.  Nursing note and vitals reviewed.   ED Course  Procedures (including critical care time) DIAGNOSTIC STUDIES: Oxygen Saturation is 96% on RA,  normal by my interpretation.    COORDINATION OF CARE: 1:30 AM Discussed treatment plan which includes lab work, CXR, EKG,  Solu-medrol, magnesium sulfate, and a breathing treatment with pt at bedside and pt agreed to plan.  2:55 AM-Consult complete with Dr. Hal Hope (Hospitalist). Patient case explained and discussed. Agrees to admit patient to stepdown for further evaluation and treatment. Call ended at 2:57 AM   Labs Review Labs Reviewed  BASIC METABOLIC PANEL - Abnormal; Notable for the following:    Chloride 100 (*)    Glucose, Bld 100 (*)    All other components within normal limits  CBC - Abnormal; Notable for the following:    Platelets 463 (*)    All other components within normal limits  I-STAT VENOUS BLOOD GAS, ED - Abnormal; Notable for the following:    pH, Ven 7.478 (*)    pCO2, Ven 29.2 (*)    pO2, Ven 156.0 (*)    All other components within normal limits  TROPONIN I  CBC  COMPREHENSIVE METABOLIC PANEL  CBC WITH DIFFERENTIAL/PLATELET  TSH    Imaging Review Dg Chest 2 View  10/06/2015  CLINICAL DATA:  Shortness of breath and wheezing for couple of weeks. History of hypertension and COPD. EXAM: CHEST  2 VIEW COMPARISON:  09/28/2015 FINDINGS: Thoracolumbar scoliosis convex towards the right. Normal heart size and pulmonary vascularity. Hyperinflation. No focal airspace disease or consolidation in the lungs. No blunting of costophrenic angles. No pneumothorax. Mediastinal  contours appear intact. IMPRESSION: No active cardiopulmonary disease. Electronically Signed   By: Lucienne Capers M.D.   On: 10/06/2015 01:16   I have personally reviewed and evaluated these images and lab results as part of my medical decision-making.   EKG Interpretation None      MDM   Final diagnoses:  Chest congestion  Productive cough    Patient presents to the ED for shortness of breath and sent by pulmonologist for admission.  On exam she is in acute resp distress with tachypnea.  She was placed on bipap and blood gas was to be drawn for evaluation of CO2.  Her O2 is 99% on RA.  Will admit to hospitalist for further care.  Will hold albuterol as the patients HR is as high as 130 due to her use at home.  She was given ipratropium, solumedrol, magnesium for treatment.  Will continue to closely monitor.  CRITICAL CARE Performed by: Everlene Balls   Total critical care time: 45 minutes - respiratory distress  Critical care time was exclusive of separately billable procedures and treating other patients.  Critical care was necessary to treat or prevent imminent or life-threatening deterioration.  Critical care was time spent personally by me on the following activities: development of treatment plan with patient and/or surrogate as well as nursing, discussions with consultants, evaluation of patient's response to treatment, examination of patient, obtaining history from patient or surrogate, ordering and performing treatments and interventions, ordering and review of laboratory studies, ordering and review of radiographic studies, pulse oximetry and re-evaluation of patient's condition.    I personally performed the services described in this documentation, which was scribed in my presence. The recorded information has been reviewed and is accurate.     Everlene Balls, MD 10/06/15 [phone]

## 2015-10-06 NOTE — Progress Notes (Signed)
Pharmacy Antibiotic Note  Toni Myers is a 65 y.o. female admitted on 10/06/2015 with cough/shortness of breath.  Pharmacy has been consulted for Levaquin dosing for bronchitis. Symptoms haven't been improving on Z-pack/prednisone. WBC WNL. Renal function ok. Other labs reviewed.   Plan: -Levaquin 750 mg IV q24h -Trend WBC, temp, renal function  -F/U infectious work-up  Weight: 136 lb 6.4 oz (61.871 kg)  Temp (24hrs), Avg:98.2 F (36.8 C), Min:98.2 F (36.8 C), Max:98.2 F (36.8 C)   Recent Labs Lab 10/05/15 2349  WBC 10.5  CREATININE 0.83    Estimated Creatinine Clearance: 57.6 mL/min (by C-G formula based on Cr of 0.83).    Allergies  Allergen Reactions  . Morphine And Related Nausea And Vomiting    "makes me sick at high doses"     Toni Myers 10/06/2015 7:00 AM

## 2015-10-06 NOTE — H&P (Signed)
History and Physical    Toni Myers [id number] DOB: 06-24-1950 DOA: 10/06/2015  PCP: Fae Pippin  Patient coming from: Home.  Chief Complaint: Shortness of breath.  HPI: Toni Myers is a 65 y.o. female with medical history significant of asthmatic bronchitis, Sjogren's syndrome, uveitis and hypertension presents to the ER because of persistent shortness of breath with wheezing. Patient has been having these symptoms for almost 3 weeks when patient has been placed on at least 2 courses of antibiotics. Patient also is on prednisone tapering dose. Last week patient was advised about admission but patient wanted to hold of admission until Mother's Day is over. Now since that patient is still short of breath patient came to the ER. ER initially patient was tachypneic and had to be placed on BiPAP. On my exam patient has improved but still wheezing and is able to complete sentences though. Denies any chest pain has been having productive cough with discolored sputum. Patient is being admitted for asthmatic bronchitis. I have reviewed patient's pulmonology notes which states that patient gets asthmatic bronchitic attacks during this season.   ED Course: Initially was on BiPAP and patient was given IV Solu-Medrol nebulizer.  Review of Systems: As per HPI otherwise 10 point review of systems negative.    Past Medical History  Diagnosis Date  . Hypertension   . Cough   . Depression   . RLS (restless legs syndrome)   . Chronic low back pain   . Migraine   . Palpitations   . Sjogrens syndrome ([address])   . Anxiety   . Shingles   . Arthritis     Rheumatoid  . Asthma   . Chronic iritis, left eye   . GERD (gastroesophageal reflux disease)   . COPD (chronic obstructive pulmonary disease) ([place])     no o2, Dr.Robert Byrum   . Dementia without behavioral disturbance     Past Surgical History  Procedure Laterality Date  . Tubal ligation    . Back surgery      07/1998 and 03/1999,  herniated disc, 2 rod and 4 screws  . Cardiac catheterization      shadow no blockage  . Esophagogastroduodenoscopy (egd) with propofol N/A 02/09/2015    Procedure: ESOPHAGOGASTRODUODENOSCOPY (EGD) WITH PROPOFOL;  Surgeon: Garlan Fair, MD;  Location: [facility];  Service: Endoscopy;  Laterality: N/A;  . Colonoscopy with propofol N/A 02/09/2015    Procedure: COLONOSCOPY WITH PROPOFOL;  Surgeon: Garlan Fair, MD;  Location: [facility];  Service: Endoscopy;  Laterality: N/A;     reports that she has never smoked. She has never used smokeless tobacco. She reports that she drinks alcohol. She reports that she does not use illicit drugs.  Allergies  Allergen Reactions  . Morphine And Related Nausea And Vomiting    "makes me sick at high doses"    Family History  Problem Relation Age of Onset  . Arthritis Mother     Prior to Admission medications   Medication Sig Start Date End Date Taking? Authorizing Provider  albuterol (PROAIR HFA) 108 (90 BASE) MCG/ACT inhaler Inhale 2 puffs into the lungs every 6 (six) hours as needed for wheezing or shortness of breath.    Yes Historical Provider, MD  albuterol (PROVENTIL) (2.5 MG/3ML) 0.083% nebulizer solution Take 2.5 mg by nebulization every 4 (four) hours as needed for wheezing or shortness of breath.    Yes Historical Provider, MD  amLODipine (NORVASC) 5 MG tablet Take 1 tablet by mouth  daily. 09/30/15  Yes Historical Provider, MD  azithromycin (ZITHROMAX) 250 MG tablet Take 2 today, then 1 daily until gone. 09/28/15  Yes Magdalen Spatz, NP  budesonide-formoterol [facility]) 160-4.5 MCG/ACT inhaler Inhale 2 puffs into the lungs 2 (two) times daily as needed (SOB).    Yes Historical Provider, MD  calcium citrate (CALCITRATE - DOSED IN MG ELEMENTAL CALCIUM) 950 MG tablet Take 200 mg of elemental calcium by mouth daily.   Yes Historical Provider, MD  clotrimazole (MYCELEX) 10 MG troche Take 1 tablet (10 mg total) by mouth 5 (five) times daily.  09/28/15  Yes Magdalen Spatz, NP  cyclobenzaprine (FLEXERIL) 10 MG tablet Take 1 tablet (10 mg total) by mouth at bedtime as needed. For spasms 07/22/13  Yes Janece Canterbury, MD  escitalopram (LEXAPRO) 5 MG tablet Take 5 mg by mouth daily.   Yes Historical Provider, MD  esomeprazole (NEXIUM 24HR) 20 MG capsule Take 20 mg by mouth daily at 12 noon.   Yes Historical Provider, MD  FOLIC ACID PO Take 2 tablets by mouth daily.    Yes Historical Provider, MD  HYDROcodone-homatropine (HYCODAN) 5-1.5 MG/5ML syrup Take 5 mLs by mouth every 6 (six) hours as needed for cough. 09/28/15  Yes Magdalen Spatz, NP  levofloxacin (LEVAQUIN) 500 MG tablet Take 1 tablet by mouth daily. 10/05/15  Yes Historical Provider, MD  Methotrexate, PF, 25 MG/0.5ML SOAJ Inject 0.6 mLs into the skin once a week. Wednesday   Yes Historical Provider, MD  predniSONE (DELTASONE) 10 MG tablet 40mg X2 days, 30mg  X2 days, 20mg  X2 days, 10mg X2 days, then stop. 09/28/15  Yes Magdalen Spatz, NP  PRESCRIPTION MEDICATION Place 1 drop into both eyes 4 (four) times daily. Eye drop made from pt's own blood   Yes Historical Provider, MD  RESTASIS 0.05 % ophthalmic emulsion Place 2 drops into both eyes daily. 09/24/15  Yes Historical Provider, MD    Physical Exam: Filed Vitals:   10/06/15 0230 10/06/15 0300 10/06/15 0311 10/06/15 0330  BP:      Pulse: 121 106 102 100  Temp:      TempSrc:      Resp: 27 31 31 24   Weight:      SpO2: 96% 99% 100% 100%      Constitutional: Not in distress. Filed Vitals:   10/06/15 0230 10/06/15 0300 10/06/15 0311 10/06/15 0330  BP:      Pulse: 121 106 102 100  Temp:      TempSrc:      Resp: 27 31 31 24   Weight:      SpO2: 96% 99% 100% 100%   Eyes: Anicteric no pallor. ENMT: No discharge from the ears eyes nose and mouth. Neck: No mass felt. No JVD appreciated. Respiratory: Bilateral expiratory wheeze or no crepitations. Cardiovascular: S1-S2 heard. Abdomen: Soft nontender bowel sounds present. No guarding or  rigidity. Musculoskeletal: No edema. Skin: No rash. Neurologic: Alert awake oriented to time place and person. Moves all extremities. Psychiatric: Appears normal.   Labs on Admission: I have personally reviewed following labs and imaging studies  CBC:  Recent Labs Lab 10/05/15 2349  WBC 10.5  HGB 13.6  HCT 39.8  MCV 96.1  PLT [id number]*   Basic Metabolic Panel:  Recent Labs Lab 10/05/15 2349  NA 136  K 4.6  CL 100*  CO2 26  GLUCOSE 100*  BUN 15  CREATININE 0.83  CALCIUM 10.1   GFR: Estimated Creatinine Clearance: 57.6 mL/min (by C-G formula based on Cr  of 0.83). Liver Function Tests: No results for input(s): AST, ALT, ALKPHOS, BILITOT, PROT, ALBUMIN in the last 168 hours. No results for input(s): LIPASE, AMYLASE in the last 168 hours. No results for input(s): AMMONIA in the last 168 hours. Coagulation Profile: No results for input(s): INR, PROTIME in the last 168 hours. Cardiac Enzymes: No results for input(s): CKTOTAL, CKMB, CKMBINDEX, TROPONINI in the last 168 hours. BNP (last 3 results) No results for input(s): PROBNP in the last 8760 hours. HbA1C: No results for input(s): HGBA1C in the last 72 hours. CBG: No results for input(s): GLUCAP in the last 168 hours. Lipid Profile: No results for input(s): CHOL, HDL, LDLCALC, TRIG, CHOLHDL, LDLDIRECT in the last 72 hours. Thyroid Function Tests: No results for input(s): TSH, T4TOTAL, FREET4, T3FREE, THYROIDAB in the last 72 hours. Anemia Panel: No results for input(s): VITAMINB12, FOLATE, FERRITIN, TIBC, IRON, RETICCTPCT in the last 72 hours. Urine analysis:    Component Value Date/Time   PHURINE 4.5 07/18/2012 1306   Sepsis Labs: @LABRCNTIP (procalcitonin:4,lacticidven:4) )No results found for this or any previous visit (from the past 240 hour(s)).   Radiological Exams on Admission: Dg Chest 2 View  10/06/2015  CLINICAL DATA:  Shortness of breath and wheezing for couple of weeks. History of hypertension and  COPD. EXAM: CHEST  2 VIEW COMPARISON:  09/28/2015 FINDINGS: Thoracolumbar scoliosis convex towards the right. Normal heart size and pulmonary vascularity. Hyperinflation. No focal airspace disease or consolidation in the lungs. No blunting of costophrenic angles. No pneumothorax. Mediastinal contours appear intact. IMPRESSION: No active cardiopulmonary disease. Electronically Signed   By: Lucienne Capers M.D.   On: 10/06/2015 01:16    EKG: Independently reviewed. Normal sinus rhythm.  Assessment/Plan Principal Problem:   Acute respiratory failure with hypoxia (HCC) Active Problems:   Essential hypertension   Rheumatoid arthritis (HCC)   Asthmatic bronchitis    #1. Acute respiratory failure and hypoxia secondary to asthmatic bronchitis exacerbation - continue with IV Solu-Medrol nebulizer Pulmicort and Levaquin. #2. Hypertension on amlodipine which will be continued. #3. History of uveitis on methotrexate. #4. History of Sjogren's disease. Chest x-ray does not show any infiltrates.   DVT prophylaxis: Lovenox. Code Status: Full code.  Family Communication: No family at the bedside.  Disposition Plan: Home.  Consults called: None.  Admission status: Inpatient. Telemetry. Likely stay 2 days.    Rise Patience MD [facility] Pager [phone].  If 7PM-7AM, please contact night-coverage [email or web address] Password [facility]  10/06/2015, 6:51 AM

## 2015-10-06 NOTE — Progress Notes (Signed)
Patient seen and evaluated earlier this AM by my associate Please refer to H and P for details regarding assessment and plan.  Gen: pt in nad, alert and awake Pulm: equal chest rise, no wheezes CV: no cyanosis  Continue current regimen. Will reassess next am.  Velvet Bathe

## 2015-10-07 ENCOUNTER — Encounter (HOSPITAL_COMMUNITY): Payer: Self-pay | Admitting: Internal Medicine

## 2015-10-07 DIAGNOSIS — I10 Essential (primary) hypertension: Secondary | ICD-10-CM

## 2015-10-07 DIAGNOSIS — M069 Rheumatoid arthritis, unspecified: Secondary | ICD-10-CM

## 2015-10-07 LAB — BASIC METABOLIC PANEL
ANION GAP: 9 (ref 5–15)
BUN: 15 mg/dL (ref 6–20)
CHLORIDE: 104 mmol/L (ref 101–111)
CO2: 27 mmol/L (ref 22–32)
Calcium: 9 mg/dL (ref 8.9–10.3)
Creatinine, Ser: 0.69 mg/dL (ref 0.44–1.00)
GFR calc non Af Amer: >60 mL/min (ref >60)
Glucose, Bld: 123 mg/dL — ABNORMAL HIGH (ref 65–99)
POTASSIUM: 4.8 mmol/L (ref 3.5–5.1)
Sodium: 140 mmol/L (ref 135–145)

## 2015-10-07 LAB — CBC
HCT: 34.9 % — ABNORMAL LOW (ref 36.0–46.0)
HEMOGLOBIN: 11.7 g/dL — AB (ref 12.0–15.0)
MCH: 32.6 pg (ref 26.0–34.0)
MCHC: 33.5 g/dL (ref 30.0–36.0)
MCV: 97.2 fL (ref 78.0–100.0)
Platelets: 383 10*3/uL (ref 150–400)
RBC: 3.59 MIL/uL — AB (ref 3.87–5.11)
RDW: 16 % — ABNORMAL HIGH (ref 11.5–15.5)
WBC: 7.1 10*3/uL (ref 4.0–10.5)

## 2015-10-07 MED ORDER — FOLIC ACID 1 MG PO TABS
1.0000 mg | ORAL_TABLET | Freq: Once | ORAL | Status: AC
  Administered 2015-10-07: 1 mg via ORAL

## 2015-10-07 MED ORDER — GUAIFENESIN ER 600 MG PO TB12
1200.0000 mg | ORAL_TABLET | Freq: Two times a day (BID) | ORAL | Status: DC
  Administered 2015-10-07 – 2015-10-15 (×17): 1200 mg via ORAL
  Filled 2015-10-07 (×20): qty 2

## 2015-10-07 MED ORDER — FOLIC ACID 1 MG PO TABS
2.0000 mg | ORAL_TABLET | Freq: Every day | ORAL | Status: DC
  Administered 2015-10-08 – 2015-10-15 (×8): 2 mg via ORAL
  Filled 2015-10-07 (×8): qty 2

## 2015-10-07 NOTE — Plan of Care (Signed)
Problem: Consults Goal: Respiratory Problems Patient Education See Patient Education Module for education specifics. Outcome: Progressing Respiratory problems pathway/education initiated (see education record for additional information). Patient maintaining good O2 saturation at rest and with progressive activity. However, during activity she still becomes short of breath. Characteristically on returning to bed, she experiences increased wheezing, coughing and tachycardia that typically resolves after several minutes of rest.  She receives nebulizer treatments, both scheduled and PRN, that seem to be helping to some degree. She has also been given guaifenesin and hycodan to relieve these symptoms with relatively good effect.  Latest vital signs are stable: Filed Vitals:    10/06/15 2000 10/06/15 2005 10/06/15 2025 10/06/15 2153  BP:   135/90   140/88  Pulse:   91 108 86  Temp:       98.5 F (36.9 C)  TempSrc:       Oral  Resp:          Height:          Weight:          SpO2: 100% 97% 100% 100%    Continuing to monitor.

## 2015-10-07 NOTE — Care Management Note (Signed)
Case Management Note  Patient Details  Name: Toni Myers MRN: [id number] Date of Birth: 1951-01-23  Subjective/Objective: Pt admitted for Acute Respiratory Failure. Pt is from home and plans to return home once stable.                     Action/Plan: No needs identified from CM at this time.    Expected Discharge Date:                  Expected Discharge Plan:  Home/Self Care  In-House Referral:  NA  Discharge planning Services  CM Consult  Post Acute Care Choice:  NA Choice offered to:  NA  DME Arranged:  N/A DME Agency:  NA  HH Arranged:  NA HH Agency:  NA  Status of Service:  Completed, signed off  Medicare Important Message Given:    Date Medicare IM Given:    Medicare IM give by:    Date Additional Medicare IM Given:    Additional Medicare Important Message give by:     If discussed at [place] of Stay Meetings, dates discussed:    Additional Comments:  Bethena Roys, RN 10/07/2015, 4:21 PM

## 2015-10-07 NOTE — Plan of Care (Signed)
Problem: Consults Goal: Respiratory Problems Patient Education See Patient Education Module for education specifics.  Outcome: Progressing Pt encouraged to turn, cough and deep breathe. Pt instructed on how to use an incentive spirometer. Respiratory therapy obtaining flutter valve, pt states they feel as though they cant get anything up. Oral 1200 dose of musinex and hycodan given, with little to no change in symptoms, continuing to have a non-productive cough and expiratory wheezing bilaterally. Pt sating 97% on 1L, used for comfort. Pt ambulating to and from bathroom, standby assist tolerating well.

## 2015-10-07 NOTE — Progress Notes (Signed)
Progress Note    Toni Myers  [id number] DOB: 1950/09/05  DOA: 10/06/2015 PCP: Fae Pippin    Brief Narrative:   Toni Myers is an 65 y.o. female with a PMH of asthmatic bronchitis, Sjogren's syndrome, uveitis and hypertension who was admitted 10/06/15 the chief complaint of a 3 week history of persistent shortness of breath/wheezing after failing outpatient treatment including 2 courses of antibiotics as well as a prednisone taper. Upon initial presentation, patient had acute respiratory failure requiring BiPAP.  Assessment/Plan:   Principal Problem:   Acute respiratory failure with hypoxia (HCC) secondary to asthmatic bronchitis with acute exacerbation Patient was admitted and placed on BiPAP, IV Solu-Medrol, nebulized bronchodilator treatments, nebulized Pulmicort, and empiric Levaquin. Still has a lot of bronchospasm, dyspnea.  Add Mucinex.  No weaning of steroids yet.  Active Problems:   Essential hypertension Patient was continued on her usual home dose of amlodipine. Blood pressure reasonable.    Rheumatoid arthritis ([place]) On weekly methotrexate. Continue folic acid.    Sjogren's syndrome Continue Restasis.   Family Communication/Anticipated D/C date and plan/Code Status   DVT prophylaxis: Lovenox ordered. Code Status: Full Code.  Family Communication: No family at the bedside. Disposition Plan: Home when respiratory status stable, likely another 2-3 days.   Medical Consultants:    None.  Procedures:    None.  Anti-Infectives:   Levaquin 10/06/15--->   Subjective:   Toni Myers is still very dyspneic and has a cough with chest congestion but unable to expectorate secretions. Appetite is okay. No nausea or vomiting.  Objective:    Filed Vitals:   10/06/15 2005 10/06/15 2025 10/06/15 2153 10/07/15 0634  BP: 135/90  140/88 135/87  Pulse: 91 108 86 85  Temp:   98.5 F (36.9 C) 98 F (36.7 C)  TempSrc:   Oral Oral  Resp:    18    Height:      Weight:    60.374 kg (133 lb 1.6 oz)  SpO2: 97% 100% 100% 100%    Intake/Output Summary (Last 24 hours) at 10/07/15 0733 Last data filed at 10/06/15 1800  Gross per 24 hour  Intake    480 ml  Output    100 ml  Net    380 ml   Filed Weights   10/05/15 2344 10/06/15 1300 10/07/15 0634  Weight: 61.871 kg (136 lb 6.4 oz) 61.689 kg (136 lb) 60.374 kg (133 lb 1.6 oz)    Exam: General exam: Mild respiratory distress. Respiratory system: Diffuse expiratory wheezes. Respiratory effort increased. Cardiovascular system: S1 & S2 heard, RRR. No JVD,  rubs, gallops or clicks. No murmurs. Gastrointestinal system: Abdomen is nondistended, soft and nontender. No organomegaly or masses felt. Normal bowel sounds heard. Central nervous system: Alert and oriented. No focal neurological deficits. Extremities: No clubbing, edema, or cyanosis. Skin: No rashes, lesions or ulcers Psychiatry: Judgement and insight appear normal. Mood & affect appropriate.   Data Reviewed:   I have personally reviewed following labs and imaging studies:  Labs: Basic Metabolic Panel:  Recent Labs Lab 10/05/15 2349 10/06/15 0749 10/07/15 0354  NA 136 136 140  K 4.6 4.3 4.8  CL 100* 99* 104  CO2 26 24 27   GLUCOSE 100* 146* 123*  BUN 15 13 15   CREATININE 0.83 0.83 0.69  CALCIUM 10.1 9.5 9.0   GFR Estimated Creatinine Clearance: 57.2 mL/min (by C-G formula based on Cr of 0.69). Liver Function Tests:  Recent Labs Lab 10/06/15 [phone]  AST 19  ALT 17  ALKPHOS 77  BILITOT 0.8  PROT 7.2  ALBUMIN 4.1   CBC:  Recent Labs Lab 10/05/15 2349 10/06/15 0749 10/07/15 0354  WBC 10.5 9.2 7.1  NEUTROABS  --  8.6*  --   HGB 13.6 12.5 11.7*  HCT 39.8 36.4 34.9*  MCV 96.1 95.5 97.2  PLT 463* 401* 383   Cardiac Enzymes:  Recent Labs Lab 10/06/15 0749  TROPONINI <0.03   Thyroid function studies:  Recent Labs  10/06/15 0749  TSH 0.114*   Urine analysis:    Component Value Date/Time    PHURINE 4.5 07/18/2012 1306   Microbiology No results found for this or any previous visit (from the past 240 hour(s)).  Radiology: Dg Chest 2 View  10/06/2015  CLINICAL DATA:  Shortness of breath and wheezing for couple of weeks. History of hypertension and COPD. EXAM: CHEST  2 VIEW COMPARISON:  09/28/2015 FINDINGS: Thoracolumbar scoliosis convex towards the right. Normal heart size and pulmonary vascularity. Hyperinflation. No focal airspace disease or consolidation in the lungs. No blunting of costophrenic angles. No pneumothorax. Mediastinal contours appear intact. IMPRESSION: No active cardiopulmonary disease. Electronically Signed   By: Lucienne Capers M.D.   On: 10/06/2015 01:16    Medications:   . albuterol  2.5 mg Nebulization TID  . amLODipine  5 mg Oral q1800  . budesonide (PULMICORT) nebulizer solution  0.25 mg Nebulization BID  . calcium citrate  200 mg of elemental calcium Oral Daily  . cycloSPORINE  2 drop Both Eyes Daily  . enoxaparin (LOVENOX) injection  40 mg Subcutaneous Q24H  . escitalopram  5 mg Oral Daily  . folic acid  1 mg Oral Daily  . levofloxacin (LEVAQUIN) IV  750 mg Intravenous Q24H  . methotrexate  15 mg Subcutaneous Weekly  . methylPREDNISolone (SOLU-MEDROL) injection  40 mg Intravenous Q12H  . pantoprazole  40 mg Oral Daily   Continuous Infusions:   Time spent: 35 minutes with > 50% of time discussing current diagnostic test results, clinical impression and plan of care.   LOS: 1 day   Margorie Renner  [facility] Pager [phone]. If unable to reach me by pager, please call my cell phone at [phone].  *Please refer to [email or web address], password [email or web address] to get updated schedule on who will round on this patient, as hospitalists switch teams weekly. If 7PM-7AM, please contact night-coverage at [email or web address], password [email or web address] for any overnight needs.  10/07/2015, 7:33 AM

## 2015-10-08 ENCOUNTER — Encounter (HOSPITAL_COMMUNITY): Payer: Self-pay | Admitting: Internal Medicine

## 2015-10-08 DIAGNOSIS — N182 Chronic kidney disease, stage 2 (mild): Secondary | ICD-10-CM

## 2015-10-08 DIAGNOSIS — M3500 Sicca syndrome, unspecified: Secondary | ICD-10-CM

## 2015-10-08 DIAGNOSIS — R05 Cough: Secondary | ICD-10-CM

## 2015-10-08 HISTORY — DX: Chronic kidney disease, stage 2 (mild): N18.2

## 2015-10-08 NOTE — Consult Note (Signed)
   [facility] CM Inpatient Consult   10/08/2015  Toni Myers Jan 03, 1951 [id number]   Patient screened for [place] Management services. Went to bedside to offer and explain [facility] Care Management program. Patient declined [place] Management follow up. Accepted [facility] Care Management brochure with contact information to call in future if changes mind. [name] reports that she is familiar with her COPD and that she would call if [place] Management is needed. Discussed [facility]MI transition of care calls. Ms. Drill pleasantly declines that as well. [facility] Care Management brochure and contact information left at bedside. Made inpatient RNCM aware that [name] pleasantly declined [place] Management services.   Marthenia Rolling, MSN-Ed, RN,BSN [facility] Liaison [phone]

## 2015-10-08 NOTE — Progress Notes (Signed)
Progress Note    Toni Myers  [id number] DOB: 1950-06-13  DOA: 10/06/2015 PCP: Fae Pippin    Brief Narrative:   Toni Myers is an 65 y.o. female with a PMH of asthmatic bronchitis, Sjogren's syndrome, uveitis and hypertension who was admitted 10/06/15 the chief complaint of a 3 week history of persistent shortness of breath/wheezing after failing outpatient treatment including 2 courses of antibiotics as well as a prednisone taper. Upon initial presentation, patient had acute respiratory failure requiring BiPAP.  Assessment/Plan:   Principal Problem:   Acute respiratory failure with hypoxia (HCC) secondary to asthmatic bronchitis with acute exacerbation Patient was admitted and placed on BiPAP, IV Solu-Medrol, nebulized bronchodilator treatments, nebulized Pulmicort, and empiric Levaquin. Still has a lot of bronchospasm, dyspnea.  Add Mucinex.  No weaning of steroids yet.  Active Problems:   Stage II chronic kidney disease Renal function stable. Baseline creatinine 0.5-0.77.    Essential hypertension Patient was continued on her usual home dose of amlodipine. Blood pressure reasonable.    Rheumatoid arthritis ([place]) On weekly methotrexate. Continue folic acid.    Sjogren's syndrome Continue Restasis.   Family Communication/Anticipated D/C date and plan/Code Status   DVT prophylaxis: Lovenox ordered. Code Status: Full Code.  Family Communication: No family at the bedside. Disposition Plan: Home when respiratory status stable, likely another 1-2 days.   Medical Consultants:    None.  Procedures:    None.  Anti-Infectives:   Levaquin 10/06/15--->   Subjective:   Toni Myers is still very dyspneic (maybe slightly improved) and has a cough with chest congestion, now productive of tenacious cream colored sputum. Appetite is okay. No nausea or vomiting. Bowels moving.  Objective:    Filed Vitals:   10/07/15 1249 10/07/15 1734 10/07/15 2138  10/08/15 0500  BP:  126/94 131/73 121/79  Pulse:  90 98 87  Temp:  98.2 F (36.8 C) 98.1 F (36.7 C) 98.5 F (36.9 C)  TempSrc:  Oral Oral Oral  Resp:      Height:      Weight:    60.328 kg (133 lb)  SpO2: 99% 96% 97% 96%    Intake/Output Summary (Last 24 hours) at 10/08/15 0714 Last data filed at 10/07/15 1700  Gross per 24 hour  Intake    480 ml  Output    500 ml  Net    -20 ml   Filed Weights   10/06/15 1300 10/07/15 0634 10/08/15 0500  Weight: 61.689 kg (136 lb) 60.374 kg (133 lb 1.6 oz) 60.328 kg (133 lb)    Exam: General exam: Mild respiratory distress. Respiratory system: Diffuse expiratory wheezes. Respiratory effort slightly improved. Cardiovascular system: S1 & S2 heard, tacycardic. No JVD,  rubs, gallops or clicks. No murmurs. Gastrointestinal system: Abdomen is nondistended, soft and nontender. No organomegaly or masses felt. Normal bowel sounds heard. Central nervous system: Alert and oriented. No focal neurological deficits. Extremities: No clubbing, edema, or cyanosis. Skin: No rashes, lesions or ulcers Psychiatry: Judgement and insight appear normal. Mood & affect appropriate.   Data Reviewed:   I have personally reviewed following labs and imaging studies:  Labs: Basic Metabolic Panel:  Recent Labs Lab 10/05/15 2349 10/06/15 0749 10/07/15 0354  NA 136 136 140  K 4.6 4.3 4.8  CL 100* 99* 104  CO2 26 24 27   GLUCOSE 100* 146* 123*  BUN 15 13 15   CREATININE 0.83 0.83 0.69  CALCIUM 10.1 9.5 9.0   GFR Estimated Creatinine Clearance:  57.2 mL/min (by C-G formula based on Cr of 0.69). Liver Function Tests:  Recent Labs Lab 10/06/15 0749  AST 19  ALT 17  ALKPHOS 77  BILITOT 0.8  PROT 7.2  ALBUMIN 4.1   CBC:  Recent Labs Lab 10/05/15 2349 10/06/15 0749 10/07/15 0354  WBC 10.5 9.2 7.1  NEUTROABS  --  8.6*  --   HGB 13.6 12.5 11.7*  HCT 39.8 36.4 34.9*  MCV 96.1 95.5 97.2  PLT 463* 401* 383   Cardiac Enzymes:  Recent Labs Lab  10/06/15 0749  TROPONINI <0.03   Thyroid function studies:  Recent Labs  10/06/15 0749  TSH 0.114*   Urine analysis:    Component Value Date/Time   PHURINE 4.5 07/18/2012 1306   Microbiology No results found for this or any previous visit (from the past 240 hour(s)).  Radiology: No results found.  Medications:   . albuterol  2.5 mg Nebulization TID  . amLODipine  5 mg Oral q1800  . budesonide (PULMICORT) nebulizer solution  0.25 mg Nebulization BID  . calcium citrate  200 mg of elemental calcium Oral Daily  . cycloSPORINE  2 drop Both Eyes Daily  . enoxaparin (LOVENOX) injection  40 mg Subcutaneous Q24H  . escitalopram  5 mg Oral Daily  . folic acid  2 mg Oral Daily  . guaiFENesin  1,200 mg Oral BID  . levofloxacin (LEVAQUIN) IV  750 mg Intravenous Q24H  . methotrexate  15 mg Subcutaneous Weekly  . methylPREDNISolone (SOLU-MEDROL) injection  40 mg Intravenous Q12H  . pantoprazole  40 mg Oral Daily   Continuous Infusions:   Time spent: 25 minutes.   LOS: 2 days   RAMA,CHRISTINA  [facility] Pager [phone]. If unable to reach me by pager, please call my cell phone at [phone].  *Please refer to [email or web address], password [email or web address] to get updated schedule on who will round on this patient, as hospitalists switch teams weekly. If 7PM-7AM, please contact night-coverage at [email or web address], password [email or web address] for any overnight needs.  10/08/2015, 7:14 AM

## 2015-10-09 LAB — BASIC METABOLIC PANEL
ANION GAP: 9 (ref 5–15)
BUN: 16 mg/dL (ref 6–20)
CALCIUM: 9.3 mg/dL (ref 8.9–10.3)
CO2: 27 mmol/L (ref 22–32)
Chloride: 104 mmol/L (ref 101–111)
Creatinine, Ser: 0.64 mg/dL (ref 0.44–1.00)
GLUCOSE: 125 mg/dL — AB (ref 65–99)
POTASSIUM: 5.2 mmol/L — AB (ref 3.5–5.1)
SODIUM: 140 mmol/L (ref 135–145)

## 2015-10-09 LAB — GLUCOSE, CAPILLARY: GLUCOSE-CAPILLARY: 109 mg/dL — AB (ref 65–99)

## 2015-10-09 LAB — CBC
HEMATOCRIT: 37.4 % (ref 36.0–46.0)
HEMOGLOBIN: 12.1 g/dL (ref 12.0–15.0)
MCH: 32.1 pg (ref 26.0–34.0)
MCHC: 32.4 g/dL (ref 30.0–36.0)
MCV: 99.2 fL (ref 78.0–100.0)
Platelets: 338 10*3/uL (ref 150–400)
RBC: 3.77 MIL/uL — AB (ref 3.87–5.11)
RDW: 15.8 % — ABNORMAL HIGH (ref 11.5–15.5)
WBC: 7 10*3/uL (ref 4.0–10.5)

## 2015-10-09 MED ORDER — BUDESONIDE 0.25 MG/2ML IN SUSP
0.2500 mg | Freq: Two times a day (BID) | RESPIRATORY_TRACT | Status: DC
  Administered 2015-10-09 – 2015-10-15 (×13): 0.25 mg via RESPIRATORY_TRACT
  Filled 2015-10-09 (×12): qty 2

## 2015-10-09 MED ORDER — HYDROCOD POLST-CPM POLST ER 10-8 MG/5ML PO SUER
5.0000 mL | Freq: Two times a day (BID) | ORAL | Status: DC
  Administered 2015-10-09 – 2015-10-15 (×13): 5 mL via ORAL
  Filled 2015-10-09 (×13): qty 5

## 2015-10-09 MED ORDER — METHYLPREDNISOLONE SODIUM SUCC 40 MG IJ SOLR
40.0000 mg | Freq: Four times a day (QID) | INTRAMUSCULAR | Status: DC
  Administered 2015-10-09 – 2015-10-10 (×5): 40 mg via INTRAVENOUS
  Filled 2015-10-09 (×6): qty 1

## 2015-10-09 MED ORDER — LEVOFLOXACIN 750 MG PO TABS
750.0000 mg | ORAL_TABLET | Freq: Every day | ORAL | Status: DC
  Administered 2015-10-10 – 2015-10-11 (×2): 750 mg via ORAL
  Filled 2015-10-09 (×2): qty 1

## 2015-10-09 MED ORDER — LEVOFLOXACIN 750 MG PO TABS: 750.0000 mg | ORAL_TABLET | Freq: Every day | ORAL | Status: DC

## 2015-10-09 NOTE — Progress Notes (Signed)
Progress Note    Toni Myers  [id number] DOB: December 04, 1950  DOA: 10/06/2015 PCP: Fae Pippin    Brief Narrative:   Toni Myers is an 65 y.o. female with a PMH of asthmatic bronchitis, Sjogren's syndrome, uveitis and hypertension who was admitted 10/06/15 the chief complaint of a 3 week history of persistent shortness of breath/wheezing after failing outpatient treatment including 2 courses of antibiotics as well as a prednisone taper. Upon initial presentation, patient had acute respiratory failure requiring BiPAP.  Assessment/Plan:   Principal Problem:   Acute respiratory failure with hypoxia (HCC) secondary to asthmatic bronchitis with acute exacerbation Patient was admitted and placed on BiPAP, IV Solu-Medrol, nebulized bronchodilator treatments, nebulized Pulmicort, and empiric Levaquin. Still has a lot of bronchospasm, dyspnea.  Continue Mucinex.  I have increased Solumedrol to 40 mg IV Q 6 hours given ongoing severe bronchospasm.  Active Problems:   Stage II chronic kidney disease Renal function stable. Baseline creatinine 0.5-0.77.    Essential hypertension Patient was continued on her usual home dose of amlodipine. Blood pressure reasonable.    Rheumatoid arthritis ([place]) On weekly methotrexate. Continue folic acid.    Sjogren's syndrome Continue Restasis.   Family Communication/Anticipated D/C date and plan/Code Status   DVT prophylaxis: Lovenox ordered. Code Status: Full Code.  Family Communication: Friend at the bedside. Disposition Plan: Home when respiratory status stable, likely another 1-2 days.   Medical Consultants:    None.  Procedures:    None.  Anti-Infectives:   Levaquin 10/06/15--->   Subjective:   Toni Myers is still very dyspneic with chest congestion and cough productive of yellow sputum.  Still audibly wheezing. Appetite OK.  No nausea or vomiting.  Objective:    Filed Vitals:   10/08/15 0756 10/08/15 1604  10/08/15 2030 10/09/15 0610  BP:  134/74 132/72 125/75  Pulse:  95 102 98  Temp:  98.6 F (37 C) 98.2 F (36.8 C) 98.4 F (36.9 C)  TempSrc:  Oral Oral Oral  Resp:  18 18 18   Height:      Weight:    60.102 kg (132 lb 8 oz)  SpO2: 95% 96% 94% 98%   No intake or output data in the 24 hours ending 10/09/15 0725 Filed Weights   10/07/15 0634 10/08/15 0500 10/09/15 0610  Weight: 60.374 kg (133 lb 1.6 oz) 60.328 kg (133 lb) 60.102 kg (132 lb 8 oz)    Exam: General exam: Mild respiratory distress. Respiratory system: Diffuse expiratory wheezes. Respiratory effort still increased. Cardiovascular system: S1 & S2 heard, tacycardic. No JVD,  rubs, gallops or clicks. No murmurs. Gastrointestinal system: Abdomen is nondistended, soft and nontender. No organomegaly or masses felt. Normal bowel sounds heard. Central nervous system: Alert and oriented. No focal neurological deficits. Extremities: No clubbing, edema, or cyanosis. Skin: No rashes, lesions or ulcers Psychiatry: Judgement and insight appear normal. Mood & affect appropriate.   Data Reviewed:   I have personally reviewed following labs and imaging studies:  Labs: Basic Metabolic Panel:  Recent Labs Lab 10/05/15 2349 10/06/15 0749 10/07/15 0354  NA 136 136 140  K 4.6 4.3 4.8  CL 100* 99* 104  CO2 26 24 27   GLUCOSE 100* 146* 123*  BUN 15 13 15   CREATININE 0.83 0.83 0.69  CALCIUM 10.1 9.5 9.0   GFR Estimated Creatinine Clearance: 57.2 mL/min (by C-G formula based on Cr of 0.69). Liver Function Tests:  Recent Labs Lab 10/06/15 0749  AST 19  ALT  17  ALKPHOS 77  BILITOT 0.8  PROT 7.2  ALBUMIN 4.1   CBC:  Recent Labs Lab 10/05/15 2349 10/06/15 0749 10/07/15 0354  WBC 10.5 9.2 7.1  NEUTROABS  --  8.6*  --   HGB 13.6 12.5 11.7*  HCT 39.8 36.4 34.9*  MCV 96.1 95.5 97.2  PLT 463* 401* 383   Cardiac Enzymes:  Recent Labs Lab 10/06/15 0749  TROPONINI <0.03   Thyroid function studies:  Recent  Labs  10/06/15 0749  TSH 0.114*   Urine analysis:    Component Value Date/Time   PHURINE 4.5 07/18/2012 1306   Microbiology No results found for this or any previous visit (from the past 240 hour(s)).  Radiology: No results found.  Medications:   . albuterol  2.5 mg Nebulization TID  . amLODipine  5 mg Oral q1800  . budesonide (PULMICORT) nebulizer solution  0.25 mg Nebulization BID  . calcium citrate  200 mg of elemental calcium Oral Daily  . cycloSPORINE  2 drop Both Eyes Daily  . enoxaparin (LOVENOX) injection  40 mg Subcutaneous Q24H  . escitalopram  5 mg Oral Daily  . folic acid  2 mg Oral Daily  . guaiFENesin  1,200 mg Oral BID  . levofloxacin (LEVAQUIN) IV  750 mg Intravenous Q24H  . methotrexate  15 mg Subcutaneous Weekly  . methylPREDNISolone (SOLU-MEDROL) injection  40 mg Intravenous Q12H  . pantoprazole  40 mg Oral Daily   Continuous Infusions:   Time spent: 25 minutes.   LOS: 3 days   [facility] Pager [phone]. If unable to reach me by pager, please call my cell phone at [phone].  *Please refer to [email or web address], password [email or web address] to get updated schedule on who will round on this patient, as hospitalists switch teams weekly. If 7PM-7AM, please contact night-coverage at [email or web address], password [email or web address] for any overnight needs.  10/09/2015, 7:25 AM

## 2015-10-09 NOTE — Care Management Important Message (Signed)
Important Message  Patient Details  Name: GLORIS BARNDT MRN: [id number] Date of Birth: [birth year]   Medicare Important Message Given:  Yes    Bethena Roys, RN 10/09/2015, 11:40 AM

## 2015-10-10 DIAGNOSIS — R946 Abnormal results of thyroid function studies: Secondary | ICD-10-CM

## 2015-10-10 DIAGNOSIS — R0789 Other chest pain: Secondary | ICD-10-CM | POA: Diagnosis present

## 2015-10-10 DIAGNOSIS — R7989 Other specified abnormal findings of blood chemistry: Secondary | ICD-10-CM | POA: Diagnosis present

## 2015-10-10 DIAGNOSIS — R002 Palpitations: Secondary | ICD-10-CM | POA: Diagnosis present

## 2015-10-10 LAB — GLUCOSE, CAPILLARY
GLUCOSE-CAPILLARY: 121 mg/dL — AB (ref 65–99)
GLUCOSE-CAPILLARY: 99 mg/dL (ref 65–99)
GLUCOSE-CAPILLARY: 136 mg/dL — AB (ref 65–99)
GLUCOSE-CAPILLARY: 130 mg/dL — AB (ref 65–99)

## 2015-10-10 LAB — TROPONIN I
TROPONIN I: 0.03 ng/mL (ref <0.031)
Troponin I: <0.03 ng/mL (ref <0.031)

## 2015-10-10 LAB — T4, FREE: Free T4: 0.89 ng/dL (ref 0.61–1.12)

## 2015-10-10 MED ORDER — METHYLPREDNISOLONE SODIUM SUCC 40 MG IJ SOLR
40.0000 mg | Freq: Four times a day (QID) | INTRAMUSCULAR | Status: DC
  Administered 2015-10-10 – 2015-10-13 (×10): 40 mg via INTRAVENOUS
  Filled 2015-10-10 (×9): qty 1

## 2015-10-10 MED ORDER — ASPIRIN 325 MG PO TABS
325.0000 mg | ORAL_TABLET | Freq: Every day | ORAL | Status: DC
  Administered 2015-10-10 – 2015-10-15 (×6): 325 mg via ORAL
  Filled 2015-10-10 (×6): qty 1

## 2015-10-10 NOTE — Progress Notes (Signed)
[facility] hospitalist progress note. Chief complaint. Palpitations and chest tightness. History of present illness. This 65 year old female in hospital with acute respiratory failure and hypoxemia secondary to asthmatic bronchitis with acute exacerbation. Patient has no listed history of coronary artery disease but does have a history of listed of palpitations otherwise not differentiated. Patient did not report symptoms immediately but instead waited hours to notify nursing. I was notified and ordered a 12-lead EKG which looks essentially unchanged from her prior EKG except for flipped T wave in lead V2. Patient has no further complaints of chest discomfort or palpitations since that original episode and also when I came to see her at bedside. Physical exam. Vital signs. Temperature 98.4, pulse 81, respiration 16, blood pressure 132/77. O2 sats 97%. General appearance. Well-developed female who is alert and in no distress. Cardiac. Rate and rhythm regular. Lungs. Breath sounds clear and equal. Abdomen soft with positive bowel sounds. No pain. Impression/plan. Problem #1. Palpitations and chest pressure. Nursing indicates no abnormal telemetry reports from central monitoring. Patient currently continues in sinus rhythm. Flipped T wave in V2 noted per EKG. We'll cycle troponin now and then every 6 hours for total of 3 sets. We'll also repeat an EKG in approximately 6 hours to evaluate for any changes.

## 2015-10-10 NOTE — Progress Notes (Signed)
Progress Note    Toni Myers  [id number] DOB: [birth year]  DOA: 10/06/2015 PCP: Fae Pippin    Brief Narrative:   Toni Myers is an 65 y.o. female with a PMH of asthmatic bronchitis, Sjogren's syndrome, uveitis and hypertension who was admitted 10/06/15 the chief complaint of a 3 week history of persistent shortness of breath/wheezing after failing outpatient treatment including 2 courses of antibiotics as well as a prednisone taper. Upon initial presentation, patient had acute respiratory failure requiring BiPAP.  Assessment/Plan:   Principal Problem:   Acute respiratory failure with hypoxia (HCC) secondary to asthmatic bronchitis with acute exacerbation Patient was admitted and placed on BiPAP, IV Solu-Medrol, nebulized bronchodilator treatments, nebulized Pulmicort, and empiric Levaquin. Still has a lot of bronchospasm, dyspnea.  Continue Mucinex. Solu-Medrol dose increased 10/09/15.  Active Problems:   Low TSH Check free T4.    Palpitations/chest tightness The patient reported palpitations accompanied by chest tightness overnight. 12-lead EKG showed flipped T waves. Troponins being cycled. Start aspirin.    Stage II chronic kidney disease Renal function stable. Baseline creatinine 0.5-0.77.    Essential hypertension Patient was continued on her usual home dose of amlodipine. Blood pressure reasonable.    Rheumatoid arthritis ([place]) On weekly methotrexate. Continue folic acid.    Sjogren's syndrome Continue Restasis.   Family Communication/Anticipated D/C date and plan/Code Status   DVT prophylaxis: Lovenox ordered. Code Status: Full Code.  Family Communication: Sister at the bedside. Disposition Plan: Home when respiratory status stable, likely another 1-2 days.   Medical Consultants:    None.  Procedures:    None.  Anti-Infectives:   Levaquin 10/06/15--->   Subjective:   Toni Myers is still dyspneic, especially with activity.  Still  coughing, non-productive.  Describes chest pain last night as "burning" and "tight", no current chest pain.  Objective:    Filed Vitals:   10/09/15 2037 10/09/15 2042 10/09/15 2340 10/10/15 0442  BP:  156/85 144/86 132/77  Pulse:  108 96 81  Temp:  97.6 F (36.4 C) 98.3 F (36.8 C) 98.4 F (36.9 C)  TempSrc:  Oral Oral Oral  Resp:  20 18 16   Height:      Weight:    60 kg (132 lb 4.4 oz)  SpO2: 97% 95% 100% 97%    Intake/Output Summary (Last 24 hours) at 10/10/15 0740 Last data filed at 10/09/15 1700  Gross per 24 hour  Intake    480 ml  Output      0 ml  Net    480 ml   Filed Weights   10/08/15 0500 10/09/15 0610 10/10/15 0442  Weight: 60.328 kg (133 lb) 60.102 kg (132 lb 8 oz) 60 kg (132 lb 4.4 oz)    Exam: General exam: Appears comfortable. Respiratory system: Diffuse expiratory wheezes. Respiratory effort still increased. Cardiovascular system: S1 & S2 heard, tacycardic. No JVD,  rubs, gallops or clicks. No murmurs. Gastrointestinal system: Abdomen is nondistended, soft and nontender. No organomegaly or masses felt. Normal bowel sounds heard. Central nervous system: Alert and oriented. No focal neurological deficits. Extremities: No clubbing, edema, or cyanosis. Skin: No rashes, lesions or ulcers Psychiatry: Judgement and insight appear normal. Mood & affect appropriate.   Data Reviewed:   I have personally reviewed following labs and imaging studies:  Labs: Basic Metabolic Panel:  Recent Labs Lab 10/05/15 2349 10/06/15 0749 10/07/15 0354 10/09/15 0647  NA 136 136 140 140  K 4.6 4.3 4.8 5.2*  CL 100*  99* 104 104  CO2 26 24 27 27   GLUCOSE 100* 146* 123* 125*  BUN 15 13 15 16   CREATININE 0.83 0.83 0.69 0.64  CALCIUM 10.1 9.5 9.0 9.3   GFR Estimated Creatinine Clearance: 57.2 mL/min (by C-G formula based on Cr of 0.64). Liver Function Tests:  Recent Labs Lab 10/06/15 0749  AST 19  ALT 17  ALKPHOS 77  BILITOT 0.8  PROT 7.2  ALBUMIN 4.1    CBC:  Recent Labs Lab 10/05/15 2349 10/06/15 0749 10/07/15 0354 10/09/15 0647  WBC 10.5 9.2 7.1 7.0  NEUTROABS  --  8.6*  --   --   HGB 13.6 12.5 11.7* 12.1  HCT 39.8 36.4 34.9* 37.4  MCV 96.1 95.5 97.2 99.2  PLT 463* 401* 383 338   Cardiac Enzymes:  Recent Labs Lab 10/06/15 0749  TROPONINI <0.03   Thyroid function studies: No results for input(s): TSH, T4TOTAL, T3FREE, THYROIDAB in the last 72 hours.  Invalid input(s): FREET3 Urine analysis:    Component Value Date/Time   PHURINE 4.5 07/18/2012 1306   Microbiology No results found for this or any previous visit (from the past 240 hour(s)).  Radiology: No results found.  Medications:   . albuterol  2.5 mg Nebulization TID  . amLODipine  5 mg Oral q1800  . budesonide (PULMICORT) nebulizer solution  0.25 mg Nebulization BID  . calcium citrate  200 mg of elemental calcium Oral Daily  . chlorpheniramine-HYDROcodone  5 mL Oral Q12H  . cycloSPORINE  2 drop Both Eyes Daily  . enoxaparin (LOVENOX) injection  40 mg Subcutaneous Q24H  . escitalopram  5 mg Oral Daily  . folic acid  2 mg Oral Daily  . guaiFENesin  1,200 mg Oral BID  . levofloxacin  750 mg Oral Daily  . methotrexate  15 mg Subcutaneous Weekly  . methylPREDNISolone (SOLU-MEDROL) injection  40 mg Intravenous Q6H  . pantoprazole  40 mg Oral Daily   Continuous Infusions:   Time spent: 25 minutes.   LOS: 4 days   [facility] Pager [phone]. If unable to reach me by pager, please call my cell phone at [phone].  *Please refer to [email or web address], password [email or web address] to get updated schedule on who will round on this patient, as hospitalists switch teams weekly. If 7PM-7AM, please contact night-coverage at [email or web address], password [email or web address] for any overnight needs.  10/10/2015, 7:40 AM

## 2015-10-10 NOTE — Progress Notes (Signed)
Patient reported chest pain described as tightness lasting 20 minutes that had occurred one hour prior when RN was in to administer medication. Kathline Magic NP notified. Patient instructed to call immediately if CP resumes. NP stated to get EKG if CP reoccurs. Will monitor.

## 2015-10-11 ENCOUNTER — Inpatient Hospital Stay (HOSPITAL_COMMUNITY): Payer: PPO

## 2015-10-11 LAB — GLUCOSE, CAPILLARY
GLUCOSE-CAPILLARY: 111 mg/dL — AB (ref 65–99)
GLUCOSE-CAPILLARY: 122 mg/dL — AB (ref 65–99)
GLUCOSE-CAPILLARY: 185 mg/dL — AB (ref 65–99)
Glucose-Capillary: 121 mg/dL — ABNORMAL HIGH (ref 65–99)
Glucose-Capillary: 116 mg/dL — ABNORMAL HIGH (ref 65–99)
Glucose-Capillary: 119 mg/dL — ABNORMAL HIGH (ref 65–99)

## 2015-10-11 MED ORDER — MAGNESIUM SULFATE 2 GM/50ML IV SOLN
2.0000 g | Freq: Once | INTRAVENOUS | Status: AC
  Administered 2015-10-11: 2 g via INTRAVENOUS
  Filled 2015-10-11: qty 50

## 2015-10-11 NOTE — Progress Notes (Signed)
Progress Note    Toni Myers  [id number] DOB: August 11, 1950  DOA: 10/06/2015 PCP: Fae Pippin    Brief Narrative:   Toni Myers is an 65 y.o. female with a PMH of asthmatic bronchitis, Sjogren's syndrome, uveitis and hypertension who was admitted 10/06/15 the chief complaint of a 3 week history of persistent shortness of breath/wheezing after failing outpatient treatment including 2 courses of antibiotics as well as a prednisone taper. Upon initial presentation, patient had acute respiratory failure requiring BiPAP.  Assessment/Plan:   Principal Problem:   Acute respiratory failure with hypoxia (HCC) secondary to asthmatic bronchitis with acute exacerbation Patient was admitted and placed on BiPAP, IV Solu-Medrol, nebulized bronchodilator treatments, nebulized Pulmicort, and empiric Levaquin. Still has a lot of bronchospasm, dyspnea.  Continue Mucinex. Solu-Medrol dose increased 10/09/15. Repeat magnesium.  Repeat CXR.  Active Problems:   Low TSH Free T4 WNL.    Palpitations/chest tightness The patient reported palpitations accompanied by chest tightness overnight 10/10/15. 12-lead EKG showed flipped T waves. Troponins negative 4. Continue aspirin.    Stage II chronic kidney disease Renal function stable. Baseline creatinine 0.5-0.77.    Essential hypertension Patient was continued on her usual home dose of amlodipine. Blood pressure reasonable.    Rheumatoid arthritis ([place]) On weekly methotrexate. Continue folic acid.    Sjogren's syndrome Continue Restasis.   Family Communication/Anticipated D/C date and plan/Code Status   DVT prophylaxis: Lovenox ordered. Code Status: Full Code.  Family Communication: Sister at the bedside. Disposition Plan: Home when respiratory status stable, likely another 1-2 days.   Medical Consultants:    None.  Procedures:    None.  Anti-Infectives:   Levaquin 10/06/15--->   Subjective:   [name] is still  dyspneic, especially with activity.  Still coughing, non-productive.  No further chest pain.  No nausea or vomiting.  No constipation.  Poor appetite.   Objective:    Filed Vitals:   10/10/15 1900 10/10/15 2057 10/11/15 0100 10/11/15 0551  BP: 127/83  122/76 142/83  Pulse: 97 88 82 78  Temp: 98.4 F (36.9 C)  98 F (36.7 C) 98.3 F (36.8 C)  TempSrc: Oral  Oral Oral  Resp: 18 18 16 18   Height:      Weight:    59.92 kg (132 lb 1.6 oz)  SpO2: 97%  98% 99%    Intake/Output Summary (Last 24 hours) at 10/11/15 0736 Last data filed at 10/10/15 2300  Gross per 24 hour  Intake   1000 ml  Output      0 ml  Net   1000 ml   Filed Weights   10/09/15 0610 10/10/15 0442 10/11/15 0551  Weight: 60.102 kg (132 lb 8 oz) 60 kg (132 lb 4.4 oz) 59.92 kg (132 lb 1.6 oz)    Exam: General exam: Appears comfortable. Respiratory system: Diffuse expiratory wheezes. Respiratory effort still increased. Cardiovascular system: S1 & S2 heard, tacycardic. No JVD,  rubs, gallops or clicks. No murmurs. Gastrointestinal system: Abdomen is nondistended, soft and nontender. No organomegaly or masses felt. Normal bowel sounds heard. Central nervous system: Alert and oriented. No focal neurological deficits. Extremities: No clubbing, edema, or cyanosis. Skin: No rashes, lesions or ulcers Psychiatry: Judgement and insight appear normal. Mood & affect appropriate.   Data Reviewed:   I have personally reviewed following labs and imaging studies:  Labs: Basic Metabolic Panel:  Recent Labs Lab 10/05/15 2349 10/06/15 0749 10/07/15 0354 10/09/15 0647  NA 136 136 140 140  K 4.6 4.3 4.8 5.2*  CL 100* 99* 104 104  CO2 26 24 27 27   GLUCOSE 100* 146* 123* 125*  BUN 15 13 15 16   CREATININE 0.83 0.83 0.69 0.64  CALCIUM 10.1 9.5 9.0 9.3   GFR Estimated Creatinine Clearance: 57.2 mL/min (by C-G formula based on Cr of 0.64). Liver Function Tests:  Recent Labs Lab 10/06/15 0749  AST 19  ALT 17   ALKPHOS 77  BILITOT 0.8  PROT 7.2  ALBUMIN 4.1   CBC:  Recent Labs Lab 10/05/15 2349 10/06/15 0749 10/07/15 0354 10/09/15 0647  WBC 10.5 9.2 7.1 7.0  NEUTROABS  --  8.6*  --   --   HGB 13.6 12.5 11.7* 12.1  HCT 39.8 36.4 34.9* 37.4  MCV 96.1 95.5 97.2 99.2  PLT 463* 401* 383 338   Cardiac Enzymes:  Recent Labs Lab 10/06/15 0749 10/10/15 0558 10/10/15 1055 10/10/15 1814  TROPONINI <0.03 <0.03 0.03 <0.03   Thyroid function studies: No results for input(s): TSH, T4TOTAL, T3FREE, THYROIDAB in the last 72 hours.  Invalid input(s): FREET3 Urine analysis:    Component Value Date/Time   PHURINE 4.5 07/18/2012 1306   Microbiology No results found for this or any previous visit (from the past 240 hour(s)).  Radiology: No results found.  Medications:   . albuterol  2.5 mg Nebulization TID  . amLODipine  5 mg Oral q1800  . aspirin  325 mg Oral Daily  . budesonide (PULMICORT) nebulizer solution  0.25 mg Nebulization BID  . calcium citrate  200 mg of elemental calcium Oral Daily  . chlorpheniramine-HYDROcodone  5 mL Oral Q12H  . cycloSPORINE  2 drop Both Eyes Daily  . enoxaparin (LOVENOX) injection  40 mg Subcutaneous Q24H  . escitalopram  5 mg Oral Daily  . folic acid  2 mg Oral Daily  . guaiFENesin  1,200 mg Oral BID  . levofloxacin  750 mg Oral Daily  . methotrexate  15 mg Subcutaneous Weekly  . methylPREDNISolone (SOLU-MEDROL) injection  40 mg Intravenous Q6H  . pantoprazole  40 mg Oral Daily   Continuous Infusions:   Time spent: 25 minutes.   LOS: 5 days   [facility] Pager [phone]. If unable to reach me by pager, please call my cell phone at [phone].  *Please refer to [email or web address], password [email or web address] to get updated schedule on who will round on this patient, as hospitalists switch teams weekly. If 7PM-7AM, please contact night-coverage at [email or web address], password [email or web address] for any overnight needs.  10/11/2015, 7:36 AM

## 2015-10-12 ENCOUNTER — Telehealth: Payer: Self-pay | Admitting: Pulmonary Disease

## 2015-10-12 ENCOUNTER — Ambulatory Visit: Payer: PPO | Admitting: Acute Care

## 2015-10-12 DIAGNOSIS — R062 Wheezing: Secondary | ICD-10-CM

## 2015-10-12 DIAGNOSIS — J9601 Acute respiratory failure with hypoxia: Secondary | ICD-10-CM

## 2015-10-12 DIAGNOSIS — J441 Chronic obstructive pulmonary disease with (acute) exacerbation: Secondary | ICD-10-CM

## 2015-10-12 DIAGNOSIS — I272 Other secondary pulmonary hypertension: Secondary | ICD-10-CM

## 2015-10-12 LAB — CBC
HEMATOCRIT: 35.3 % — AB (ref 36.0–46.0)
HEMOGLOBIN: 11.6 g/dL — AB (ref 12.0–15.0)
MCH: 32 pg (ref 26.0–34.0)
MCHC: 32.9 g/dL (ref 30.0–36.0)
MCV: 97.2 fL (ref 78.0–100.0)
Platelets: 290 10*3/uL (ref 150–400)
RBC: 3.63 MIL/uL — ABNORMAL LOW (ref 3.87–5.11)
RDW: 15.3 % (ref 11.5–15.5)
WBC: 8.1 10*3/uL (ref 4.0–10.5)

## 2015-10-12 LAB — GLUCOSE, CAPILLARY
GLUCOSE-CAPILLARY: 77 mg/dL (ref 65–99)
GLUCOSE-CAPILLARY: 138 mg/dL — AB (ref 65–99)
Glucose-Capillary: 113 mg/dL — ABNORMAL HIGH (ref 65–99)

## 2015-10-12 LAB — BASIC METABOLIC PANEL
ANION GAP: 7 (ref 5–15)
BUN: 21 mg/dL — ABNORMAL HIGH (ref 6–20)
CHLORIDE: 101 mmol/L (ref 101–111)
CO2: 28 mmol/L (ref 22–32)
Calcium: 8.7 mg/dL — ABNORMAL LOW (ref 8.9–10.3)
Creatinine, Ser: 0.66 mg/dL (ref 0.44–1.00)
GFR calc Af Amer: >60 mL/min (ref >60)
GLUCOSE: 131 mg/dL — AB (ref 65–99)
POTASSIUM: 4.3 mmol/L (ref 3.5–5.1)
Sodium: 136 mmol/L (ref 135–145)

## 2015-10-12 NOTE — Consult Note (Signed)
Name: Toni Myers MRN: [id number] DOB: 03-Jan-1951    ADMISSION DATE:  10/06/2015 CONSULTATION DATE:  5/22  REFERRING MD :  Rama ([facility])   CHIEF COMPLAINT:  Dyspnea   BRIEF PATIENT DESCRIPTION: 65yo female never smoker with hx HTN, CKD, chronic pain, asthma, Sjogren's, RA, scoliosis with restriction on PFT's, uveitis on methotrexate presented 5/16 with 3 week hx SOB/wheezing despite outpt rx with abx and pred taper (seen in pulm office 5/11 - thought slow to resolve asthma flare, refused hospitalization).    SIGNIFICANT EVENTS    STUDIES:  CT chest 5/21>>>  1. Faint patchy centrilobular micronodularity in both lungs, upper lobe predominant. In the inpatient setting, this suggests a minimal infectious or inflammatory bronchiolitis, with the differential including hypersensitivity pneumonitis. No acute consolidative airspace disease to suggest a pneumonia. 2. No evidence of interstitial lung disease. Specifically no significant regions of subpleural reticulation, traction bronchiectasis or frank honeycombing. 3. Severe thoracic spine dextroscoliosis. 2D echo 5/22>>>   HISTORY OF PRESENT ILLNESS:  65yo female never smoker with hx HTN, CKD, chronic pain, asthma, Sjogren's, RA, scoliosis with restriction on PFT's, uveitis on methotrexate presented 5/16 with 3 week hx SOB/wheezing despite outpt rx with abx and pred taper (seen in pulm office 5/11 - thought slow to resolve asthma flare, refused hospitalization).    Still c/o SOB, wheezing, dry cough.   Pt currently denies chest pain, purulent sputum, fevers/chills, n/v/d, leg/calf pain.    Lives in a house, no recent changes.  No pets.    PAST MEDICAL HISTORY :   has a past medical history of Hypertension; Cough; Depression; RLS (restless legs syndrome); Chronic low back pain; Migraine; Palpitations; Sjogrens syndrome ([place]); Anxiety; Shingles; Arthritis; Asthma; Chronic iritis, left eye; GERD (gastroesophageal reflux disease); COPD  (chronic obstructive pulmonary disease) ([place]); Dementia without behavioral disturbance; Shortness of breath dyspnea; Heart murmur; Closed fracture of multiple pubic rami ([place]) (07/19/2013); Closed fracture of pelvic rim ([place]) (07/19/2013); and CKD (chronic kidney disease), stage II (10/08/2015).  has past surgical history that includes Tubal ligation; Back surgery; Cardiac catheterization; Esophagogastroduodenoscopy (egd) with propofol (N/A, 02/09/2015); and Colonoscopy with propofol (N/A, 02/09/2015). Prior to Admission medications   Medication Sig Start Date End Date Taking? Authorizing Provider  albuterol (PROAIR HFA) 108 (90 BASE) MCG/ACT inhaler Inhale 2 puffs into the lungs every 6 (six) hours as needed for wheezing or shortness of breath.    Yes Historical Provider, MD  albuterol (PROVENTIL) (2.5 MG/3ML) 0.083% nebulizer solution Take 2.5 mg by nebulization every 4 (four) hours as needed for wheezing or shortness of breath.    Yes Historical Provider, MD  amLODipine (NORVASC) 5 MG tablet Take 1 tablet by mouth daily. 09/30/15  Yes Historical Provider, MD  azithromycin (ZITHROMAX) 250 MG tablet Take 2 today, then 1 daily until gone. 09/28/15  Yes Magdalen Spatz, NP  budesonide-formoterol [facility]) 160-4.5 MCG/ACT inhaler Inhale 2 puffs into the lungs 2 (two) times daily as needed (SOB).    Yes Historical Provider, MD  calcium citrate (CALCITRATE - DOSED IN MG ELEMENTAL CALCIUM) 950 MG tablet Take 200 mg of elemental calcium by mouth daily.   Yes Historical Provider, MD  clotrimazole (MYCELEX) 10 MG troche Take 1 tablet (10 mg total) by mouth 5 (five) times daily. 09/28/15  Yes Magdalen Spatz, NP  cyclobenzaprine (FLEXERIL) 10 MG tablet Take 1 tablet (10 mg total) by mouth at bedtime as needed. For spasms 07/22/13  Yes Janece Canterbury, MD  escitalopram (LEXAPRO) 5 MG tablet Take 5  mg by mouth daily.   Yes Historical Provider, MD  esomeprazole (NEXIUM 24HR) 20 MG capsule Take 20 mg by mouth daily at 12 noon.    Yes Historical Provider, MD  FOLIC ACID PO Take 2 tablets by mouth daily.    Yes Historical Provider, MD  HYDROcodone-homatropine (HYCODAN) 5-1.5 MG/5ML syrup Take 5 mLs by mouth every 6 (six) hours as needed for cough. 09/28/15  Yes Magdalen Spatz, NP  levofloxacin (LEVAQUIN) 500 MG tablet Take 1 tablet by mouth daily. 10/05/15  Yes Historical Provider, MD  methotrexate 250 MG/10ML injection Inject 15 mg into the vein once. Injects 0.6 ml subcutaneously every Wed (in stomach).   Yes Historical Provider, MD  predniSONE (DELTASONE) 10 MG tablet 40mg X2 days, 30mg  X2 days, 20mg  X2 days, 10mg X2 days, then stop. 09/28/15  Yes Magdalen Spatz, NP  PRESCRIPTION MEDICATION Place 1 drop into both eyes 4 (four) times daily. Eye drop made from pt's own blood   Yes Historical Provider, MD  RESTASIS 0.05 % ophthalmic emulsion Place 2 drops into both eyes daily. 09/24/15  Yes Historical Provider, MD   Allergies  Allergen Reactions  . Morphine And Related Nausea And Vomiting    "makes me sick at high doses"    FAMILY HISTORY:  family history includes Arthritis in her mother. SOCIAL HISTORY:  reports that she has never smoked. She has never used smokeless tobacco. She reports that she drinks alcohol. She reports that she does not use illicit drugs.  REVIEW OF SYSTEMS:   As per HPI - All other systems reviewed and were neg.    SUBJECTIVE:   VITAL SIGNS: Temp:  [98.1 F (36.7 C)-98.6 F (37 C)] 98.6 F (37 C) (05/22 0844) Pulse Rate:  [78-115] 115 (05/22 0844) Resp:  [18-20] 18 (05/22 0520) BP: (119-139)/(71-87) 119/71 mmHg (05/22 0844) SpO2:  [96 %-100 %] 99 % (05/22 0844) FiO2 (%):  [24 %] 24 % (05/22 0725) Weight:  [60.51 kg (133 lb 6.4 oz)] 60.51 kg (133 lb 6.4 oz) (05/22 0520)  PHYSICAL EXAMINATION: General:  Pleasant female, NAD  Neuro:  Awake, alert, appropriate, MAE  HEENT:  Mm moist, no JVD  Cardiovascular:  s1s2 rrr Lungs:  resps even non labored on 1L [place], diffuse wheeze  Abdomen:  Soft,  non tender, +bs  Musculoskeletal:  Warm and dry, no edema  Skin:  No rash    Recent Labs Lab 10/07/15 0354 10/09/15 0647 10/12/15 0424  NA 140 140 136  K 4.8 5.2* 4.3  CL 104 104 101  CO2 27 27 28   BUN 15 16 21*  CREATININE 0.69 0.64 0.66  GLUCOSE 123* 125* 131*    Recent Labs Lab 10/07/15 0354 10/09/15 0647 10/12/15 0424  HGB 11.7* 12.1 11.6*  HCT 34.9* 37.4 35.3*  WBC 7.1 7.0 8.1  PLT 383 338 290   Ct Chest High Resolution  10/11/2015  CLINICAL DATA:  65 year old female inpatient with Sjogren syndrome, uveitis and shortness of breath/wheezing refractory to antibiotic and steroid therapy. Reported history is bronchospasm. EXAM: CT CHEST WITHOUT CONTRAST TECHNIQUE: Multidetector CT imaging of the chest was performed following the standard protocol without intravenous contrast. High resolution imaging of the lungs, as well as inspiratory and expiratory imaging, was performed. COMPARISON:  10/06/2015 chest radiograph. High-resolution chest CT from 09/15/2014. FINDINGS: Mediastinum/Nodes: Normal heart size. No pericardial fluid/thickening. Great vessels are normal in course and caliber. Hypodense medial right thyroid lobe 1.0 cm nodule, not appreciably changed. Normal esophagus. No pathologically enlarged axillary, mediastinal  or gross hilar lymph nodes, noting limited sensitivity for the detection of hilar adenopathy on this noncontrast study. Lungs/Pleura: No pneumothorax. No pleural effusion. There are 3 scattered tiny subpleural pulmonary nodules in both lungs, largest 4 mm in the anterior left upper lobe (series 5/ image 84) and 3 mm in the anterior right middle lobe (series 5/ image 91), all stable since 09/15/2014 suggesting benign nodules. A few parenchymal bands in both lower lobes suggest mild platelike atelectasis and/or areas of postinfectious/postinflammatory scarring. No acute consolidative airspace disease, new significant pulmonary nodules or lung masses. There is faint  patchy centrilobular micronodularity in both lungs, upper lobe predominant. No significant regions of subpleural reticulation, traction bronchiectasis, architectural distortion or frank honeycombing. No significant air trapping on the expiration sequence. Upper abdomen: Unremarkable. Musculoskeletal: No aggressive appearing focal osseous lesions. Severe dextroscoliosis in the thoracic spine, unchanged. Moderate degenerative changes in the thoracic spine. IMPRESSION: 1. Faint patchy centrilobular micronodularity in both lungs, upper lobe predominant. In the inpatient setting, this suggests a minimal infectious or inflammatory bronchiolitis, with the differential including hypersensitivity pneumonitis. No acute consolidative airspace disease to suggest a pneumonia. 2. No evidence of interstitial lung disease. Specifically no significant regions of subpleural reticulation, traction bronchiectasis or frank honeycombing. 3. Severe thoracic spine dextroscoliosis. Electronically Signed   By: Ilona Sorrel M.D.   On: 10/11/2015 15:40    ASSESSMENT / PLAN:  Dyspnea -- patchy centrilobular micronodularity noted on CT is very minimal.  Doubt significant pneumonitis.   Asthmatic bronchitis with flare   REC -  Cont rx asthmatic bronchitis flare with steroids and taper as able  Wean off O2  2D echo - r/o pulm HTN  GERD rx  Continue BD's, budesonide, mucolytic, cough suppression  pulm hygiene  Continue to monitor off abx   Nickolas Madrid, NP 10/12/2015  11:04 AM Pager: [phone] or [id number]  Attending Note:  65 year old female with PMH of Sjogren syndrome on shots of methotrexate weekly who presents with SOB and hypoxemia.  CT was done and revealed upper lobe predominant centrilobular infiltrate concerning for HSP.  Patient was treated with steroids without response.  PCCM was asked to evaluate.  On exam, there is end exp wheezes.  I reviewed CT myself, very minimal bronchiolitis like pattern  noted.  Discussed with PCCM-NP and TRH-MD.  FEV1 48% of predicted with no reactivity.  Patient evidently always wheezes.  Pulmonary infiltrate: Do not believe this new or clinically relevant.  - D/C steroids for purpose of infiltrate, this is not HSP.  - Would not react much to this as it was present last year as well.  Wheezing: Obstructive lung diease on LFTs.  - Duonebs.  - Albuterol.  - ICS.  - LABA.  - Use steroids for wheezing.  Hypoxemia:  - Titrate O2 for sat of 88-92%.  - May need ambulatory desaturation study prior to discharge.  ?Pulmonary HTN:  - 2D echo as ordered.  Patient seen and examined, agree with above note.  I dictated the care and orders written for this patient under my direction.  Rush Farmer, MD [phone]

## 2015-10-12 NOTE — Care Management Important Message (Signed)
Important Message  Patient Details  Name: Toni Myers MRN: [id number] Date of Birth: 1950/09/08   Medicare Important Message Given:  Yes    Nathen May 10/12/2015, 1:46 PM

## 2015-10-12 NOTE — Telephone Encounter (Signed)
Spoke with pt and she wants Dr Nelda Marseille to discuss the CT findings with the radiologist that read them. She does not agree with Dr Nelda Marseille stating that he disagrees with the radiologist. She would also like the results given to Dr Rockne Menghini for further review.   Toni Myers in hospital. Paged with message info.   Toni Myers please advise. Thanks!

## 2015-10-12 NOTE — Progress Notes (Signed)
Progress Note    Toni Myers  [id number] DOB: [birth year]  DOA: 10/06/2015 PCP: Fae Pippin    Brief Narrative:   Toni Myers is an 65 y.o. female with a PMH of asthmatic bronchitis, Sjogren's syndrome, uveitis and hypertension who was admitted 10/06/15 the chief complaint of a 3 week history of persistent shortness of breath/wheezing after failing outpatient treatment including 2 courses of antibiotics as well as a prednisone taper. Upon initial presentation, patient had acute respiratory failure requiring BiPAP.  Assessment/Plan:   Principal Problem:   Acute respiratory failure with hypoxia (HCC) secondary to asthmatic bronchitis with acute exacerbation Patient was admitted and placed on BiPAP, IV Solu-Medrol, nebulized bronchodilator treatments, nebulized Pulmicort, and empiric Levaquin. Still has a lot of bronchospasm, dyspnea.  Continue Mucinex. Solu-Medrol dose increased 10/09/15. Repeat magnesium.  High resolution CT done 10/11/15 and showed findings consistent with an infectious or inflammatory bronchiolitis versus hypersensitivity pneumonitis. We'll ask pulmonology to consult. Discontinue Levaquin.  Active Problems:   Low TSH Free T4 WNL.    Palpitations/chest tightness The patient reported palpitations accompanied by chest tightness overnight 10/10/15. 12-lead EKG showed flipped T waves. Troponins negative 4. Continue aspirin.    Stage II chronic kidney disease Renal function stable. Baseline creatinine 0.5-0.77.    Essential hypertension Patient was continued on her usual home dose of amlodipine. Blood pressure reasonable.    Rheumatoid arthritis ([place]) On weekly methotrexate. Continue folic acid.    Sjogren's syndrome Continue Restasis.   Family Communication/Anticipated D/C date and plan/Code Status   DVT prophylaxis: Lovenox ordered. Code Status: Full Code.  Family Communication: Daughter at the bedside. Disposition Plan: Home when respiratory  status stable, likely another 1-2 days.   Medical Consultants:    Pulmonology  Procedures:    None.  Anti-Infectives:   Levaquin 10/06/15--->10/12/15   Subjective:   Toni Myers is about the same, still dyspneic with a mostly nonproductive cough.   Objective:    Filed Vitals:   10/11/15 1700 10/11/15 2040 10/11/15 2130 10/12/15 0520  BP: 129/78  132/76 121/75  Pulse: 109  112 78  Temp: 98.3 F (36.8 C)  98.1 F (36.7 C) 98.1 F (36.7 C)  TempSrc: Oral  Oral Oral  Resp: 18  18 18   Height:      Weight:    60.51 kg (133 lb 6.4 oz)  SpO2: 100% 96% 97% 98%    Intake/Output Summary (Last 24 hours) at 10/12/15 0711 Last data filed at 10/12/15 0600  Gross per 24 hour  Intake    820 ml  Output      0 ml  Net    820 ml   Filed Weights   10/10/15 0442 10/11/15 0551 10/12/15 0520  Weight: 60 kg (132 lb 4.4 oz) 59.92 kg (132 lb 1.6 oz) 60.51 kg (133 lb 6.4 oz)    Exam: General exam: Appears comfortable. Respiratory system: Diffuse expiratory wheezes. Respiratory effort Improved. Cardiovascular system: S1 & S2 heard, tacycardic. No JVD,  rubs, gallops or clicks. No murmurs. Gastrointestinal system: Abdomen is nondistended, soft and nontender. No organomegaly or masses felt. Normal bowel sounds heard. Central nervous system: Alert and oriented. No focal neurological deficits. Extremities: No clubbing, edema, or cyanosis. Skin: No rashes, lesions or ulcers Psychiatry: Judgement and insight appear normal. Mood & affect appropriate.   Data Reviewed:   I have personally reviewed following labs and imaging studies:  Labs: Basic Metabolic Panel:  Recent Labs Lab 10/05/15 2349 10/06/15 0749 10/07/15  0354 10/09/15 0647 10/12/15 0424  NA 136 136 140 140 136  K 4.6 4.3 4.8 5.2* 4.3  CL 100* 99* 104 104 101  CO2 26 24 27 27 28   GLUCOSE 100* 146* 123* 125* 131*  BUN 15 13 15 16  21*  CREATININE 0.83 0.83 0.69 0.64 0.66  CALCIUM 10.1 9.5 9.0 9.3 8.7*    GFR Estimated Creatinine Clearance: 57.2 mL/min (by C-G formula based on Cr of 0.66). Liver Function Tests:  Recent Labs Lab 10/06/15 0749  AST 19  ALT 17  ALKPHOS 77  BILITOT 0.8  PROT 7.2  ALBUMIN 4.1   CBC:  Recent Labs Lab 10/05/15 2349 10/06/15 0749 10/07/15 0354 10/09/15 0647 10/12/15 0424  WBC 10.5 9.2 7.1 7.0 8.1  NEUTROABS  --  8.6*  --   --   --   HGB 13.6 12.5 11.7* 12.1 11.6*  HCT 39.8 36.4 34.9* 37.4 35.3*  MCV 96.1 95.5 97.2 99.2 97.2  PLT 463* 401* 383 338 290   Cardiac Enzymes:  Recent Labs Lab 10/06/15 0749 10/10/15 0558 10/10/15 1055 10/10/15 1814  TROPONINI <0.03 <0.03 0.03 <0.03   Thyroid function studies: No results for input(s): TSH, T4TOTAL, T3FREE, THYROIDAB in the last 72 hours.  Invalid input(s): FREET3 Urine analysis:    Component Value Date/Time   PHURINE 4.5 07/18/2012 1306   Microbiology No results found for this or any previous visit (from the past 240 hour(s)).  Radiology: Ct Chest High Resolution  10/11/2015  CLINICAL DATA:  65 year old female inpatient with Sjogren syndrome, uveitis and shortness of breath/wheezing refractory to antibiotic and steroid therapy. Reported history is bronchospasm. EXAM: CT CHEST WITHOUT CONTRAST TECHNIQUE: Multidetector CT imaging of the chest was performed following the standard protocol without intravenous contrast. High resolution imaging of the lungs, as well as inspiratory and expiratory imaging, was performed. COMPARISON:  10/06/2015 chest radiograph. High-resolution chest CT from 09/15/2014. FINDINGS: Mediastinum/Nodes: Normal heart size. No pericardial fluid/thickening. Great vessels are normal in course and caliber. Hypodense medial right thyroid lobe 1.0 cm nodule, not appreciably changed. Normal esophagus. No pathologically enlarged axillary, mediastinal or gross hilar lymph nodes, noting limited sensitivity for the detection of hilar adenopathy on this noncontrast study. Lungs/Pleura:  No pneumothorax. No pleural effusion. There are 3 scattered tiny subpleural pulmonary nodules in both lungs, largest 4 mm in the anterior left upper lobe (series 5/ image 84) and 3 mm in the anterior right middle lobe (series 5/ image 91), all stable since 09/15/2014 suggesting benign nodules. A few parenchymal bands in both lower lobes suggest mild platelike atelectasis and/or areas of postinfectious/postinflammatory scarring. No acute consolidative airspace disease, new significant pulmonary nodules or lung masses. There is faint patchy centrilobular micronodularity in both lungs, upper lobe predominant. No significant regions of subpleural reticulation, traction bronchiectasis, architectural distortion or frank honeycombing. No significant air trapping on the expiration sequence. Upper abdomen: Unremarkable. Musculoskeletal: No aggressive appearing focal osseous lesions. Severe dextroscoliosis in the thoracic spine, unchanged. Moderate degenerative changes in the thoracic spine. IMPRESSION: 1. Faint patchy centrilobular micronodularity in both lungs, upper lobe predominant. In the inpatient setting, this suggests a minimal infectious or inflammatory bronchiolitis, with the differential including hypersensitivity pneumonitis. No acute consolidative airspace disease to suggest a pneumonia. 2. No evidence of interstitial lung disease. Specifically no significant regions of subpleural reticulation, traction bronchiectasis or frank honeycombing. 3. Severe thoracic spine dextroscoliosis. Electronically Signed   By: Ilona Sorrel M.D.   On: 10/11/2015 15:40    Medications:   . albuterol  2.5 mg Nebulization TID  . amLODipine  5 mg Oral q1800  . aspirin  325 mg Oral Daily  . budesonide (PULMICORT) nebulizer solution  0.25 mg Nebulization BID  . calcium citrate  200 mg of elemental calcium Oral Daily  . chlorpheniramine-HYDROcodone  5 mL Oral Q12H  . cycloSPORINE  2 drop Both Eyes Daily  . enoxaparin (LOVENOX)  injection  40 mg Subcutaneous Q24H  . escitalopram  5 mg Oral Daily  . folic acid  2 mg Oral Daily  . guaiFENesin  1,200 mg Oral BID  . levofloxacin  750 mg Oral Daily  . methotrexate  15 mg Subcutaneous Weekly  . methylPREDNISolone (SOLU-MEDROL) injection  40 mg Intravenous Q6H  . pantoprazole  40 mg Oral Daily   Continuous Infusions:   Time spent: 25 minutes.   LOS: 6 days   [facility] Pager [phone]. If unable to reach me by pager, please call my cell phone at [phone].  *Please refer to [email or web address], password [email or web address] to get updated schedule on who will round on this patient, as hospitalists switch teams weekly. If 7PM-7AM, please contact night-coverage at [email or web address], password [email or web address] for any overnight needs.  10/12/2015, 7:11 AM

## 2015-10-13 ENCOUNTER — Inpatient Hospital Stay (HOSPITAL_COMMUNITY): Payer: PPO

## 2015-10-13 DIAGNOSIS — J4540 Moderate persistent asthma, uncomplicated: Secondary | ICD-10-CM

## 2015-10-13 DIAGNOSIS — J9600 Acute respiratory failure, unspecified whether with hypoxia or hypercapnia: Secondary | ICD-10-CM

## 2015-10-13 DIAGNOSIS — J209 Acute bronchitis, unspecified: Secondary | ICD-10-CM

## 2015-10-13 DIAGNOSIS — R0989 Other specified symptoms and signs involving the circulatory and respiratory systems: Secondary | ICD-10-CM

## 2015-10-13 DIAGNOSIS — R0600 Dyspnea, unspecified: Secondary | ICD-10-CM

## 2015-10-13 DIAGNOSIS — J9801 Acute bronchospasm: Secondary | ICD-10-CM | POA: Insufficient documentation

## 2015-10-13 LAB — RESPIRATORY PANEL BY PCR
Adenovirus: NOT DETECTED
BORDETELLA PERTUSSIS-RVPCR: NOT DETECTED
CHLAMYDOPHILA PNEUMONIAE-RVPPCR: NOT DETECTED
Coronavirus 229E: NOT DETECTED
Coronavirus HKU1: NOT DETECTED
Coronavirus NL63: NOT DETECTED
Coronavirus OC43: NOT DETECTED
INFLUENZA A H1-RVPPCR: NOT DETECTED
INFLUENZA A H3-RVPPCR: NOT DETECTED
INFLUENZA A-RVPPCR: NOT DETECTED
Influenza A H1 2009: NOT DETECTED
Influenza B: NOT DETECTED
Metapneumovirus: NOT DETECTED
Mycoplasma pneumoniae: NOT DETECTED
PARAINFLUENZA VIRUS 3-RVPPCR: NOT DETECTED
PARAINFLUENZA VIRUS 4-RVPPCR: NOT DETECTED
Parainfluenza Virus 1: NOT DETECTED
Parainfluenza Virus 2: NOT DETECTED
RHINOVIRUS / ENTEROVIRUS - RVPPCR: NOT DETECTED
Respiratory Syncytial Virus: NOT DETECTED

## 2015-10-13 LAB — ECHOCARDIOGRAM COMPLETE
HEIGHTINCHES: 63 in
WEIGHTICAEL: 2131.2 [oz_av]

## 2015-10-13 LAB — CREATININE, SERUM: CREATININE: 0.59 mg/dL (ref 0.44–1.00)

## 2015-10-13 LAB — GLUCOSE, CAPILLARY: GLUCOSE-CAPILLARY: 98 mg/dL (ref 65–99)

## 2015-10-13 MED ORDER — METHYLPREDNISOLONE SODIUM SUCC 40 MG IJ SOLR
40.0000 mg | Freq: Two times a day (BID) | INTRAMUSCULAR | Status: DC
Start: 2015-10-13 — End: 2015-10-14
  Administered 2015-10-13 – 2015-10-14 (×2): 40 mg via INTRAVENOUS
  Filled 2015-10-13 (×4): qty 1

## 2015-10-13 MED ORDER — DOCUSATE SODIUM 100 MG PO CAPS
100.0000 mg | ORAL_CAPSULE | Freq: Two times a day (BID) | ORAL | Status: DC
  Administered 2015-10-13 – 2015-10-15 (×4): 100 mg via ORAL
  Filled 2015-10-13 (×4): qty 1

## 2015-10-13 NOTE — Progress Notes (Signed)
Progress Note    Toni Myers  [id number] DOB: 19-May-1951  DOA: 10/06/2015 PCP: Fae Pippin    Brief Narrative:   Toni Myers is an 65 y.o. female with a PMH of asthmatic bronchitis, Sjogren's syndrome, uveitis and hypertension who was admitted 10/06/15 the chief complaint of a 3 week history of persistent shortness of breath/wheezing after failing outpatient treatment including 2 courses of antibiotics as well as a prednisone taper. Upon initial presentation, patient had acute respiratory failure requiring BiPAP.  Assessment/Plan:   Principal Problem:   Acute respiratory failure with hypoxia (HCC) secondary to asthmatic bronchitis with acute exacerbation Patient was admitted and placed on BiPAP, IV Solu-Medrol, nebulized bronchodilator treatments, nebulized Pulmicort, and empiric Levaquin. Still has a lot of bronchospasm, dyspnea.  Continue Mucinex. Solu-Medrol dose increased 10/09/15. Repeat magnesium.  High resolution CT done 10/11/15 and showed findings consistent with an infectious or inflammatory bronchiolitis versus hypersensitivity pneumonitis. Pulmonology consultation performed 10/12/15. Recommended discontinuation of steroids and to obtain 2-D echocardiogram to evaluate for pulmonary hypertension. Decrease Solu-medrol to 40 mg IV Q 12 hours.  Active Problems:   Low TSH Free T4 WNL.    Palpitations/chest tightness The patient reported palpitations accompanied by chest tightness overnight 10/10/15 and again 10/13/15. 12-lead EKG showed flipped T waves. Troponins negative 4. Continue aspirin. Resolved after a few minutes.    Stage II chronic kidney disease Renal function stable. Baseline creatinine 0.5-0.77.    Essential hypertension Patient was continued on her usual home dose of amlodipine. Blood pressure reasonable.    Rheumatoid arthritis ([place]) On weekly methotrexate. Continue folic acid.    Sjogren's syndrome Continue Restasis.   Family  Communication/Anticipated D/C date and plan/Code Status   DVT prophylaxis: Lovenox ordered. Code Status: Full Code.  Family Communication: Daughter at the bedside 10/12/15. Disposition Plan: Home when respiratory status stable, likely another 1-2 days.   Medical Consultants:    Pulmonology  Procedures:    2-D echocardiogram  Anti-Infectives:   Levaquin 10/06/15--->10/12/15  Subjective:   Toni Myers is about the same, still dyspneic with a mostly nonproductive cough. Had another episode of chest pain in the night, rated 7/10 that woke her up from sleep.  Lasted only minutes.   Objective:    Filed Vitals:   10/12/15 2022 10/13/15 0127 10/13/15 0500 10/13/15 0741  BP: 119/62  124/73   Pulse: 100  92   Temp: 98.9 F (37.2 C)  98.4 F (36.9 C)   TempSrc: Oral  Oral   Resp: 20  20   Height:      Weight:   60.419 kg (133 lb 3.2 oz)   SpO2: 97% 97% 100% 99%    Intake/Output Summary (Last 24 hours) at 10/13/15 0753 Last data filed at 10/12/15 1730  Gross per 24 hour  Intake    360 ml  Output      0 ml  Net    360 ml   Filed Weights   10/11/15 0551 10/12/15 0520 10/13/15 0500  Weight: 59.92 kg (132 lb 1.6 oz) 60.51 kg (133 lb 6.4 oz) 60.419 kg (133 lb 3.2 oz)    Exam: General exam: Appears comfortable. Respiratory system: Diffuse expiratory wheezes. Respiratory effort Improved.  Wheezes less prominent when breathing through her nose. Cardiovascular system: S1 & S2 heard, mildly tacycardic. No JVD,  rubs, gallops or clicks. No murmurs. Gastrointestinal system: Abdomen is nondistended, soft and nontender. No organomegaly or masses felt. Normal bowel sounds heard. Central nervous system: Alert  and oriented. No focal neurological deficits. Extremities: No clubbing, edema, or cyanosis. Skin: No rashes, lesions or ulcers Psychiatry: Judgement and insight appear normal. Mood & affect appropriate.   Data Reviewed:   I have personally reviewed following labs and imaging  studies:  Labs: Basic Metabolic Panel:  Recent Labs Lab 10/07/15 0354 10/09/15 0647 10/12/15 0424 10/13/15 0455  NA 140 140 136  --   K 4.8 5.2* 4.3  --   CL 104 104 101  --   CO2 27 27 28   --   GLUCOSE 123* 125* 131*  --   BUN 15 16 21*  --   CREATININE 0.69 0.64 0.66 0.59  CALCIUM 9.0 9.3 8.7*  --    GFR Estimated Creatinine Clearance: 57.2 mL/min (by C-G formula based on Cr of 0.59). Liver Function Tests: No results for input(s): AST, ALT, ALKPHOS, BILITOT, PROT, ALBUMIN in the last 168 hours. CBC:  Recent Labs Lab 10/07/15 0354 10/09/15 0647 10/12/15 0424  WBC 7.1 7.0 8.1  HGB 11.7* 12.1 11.6*  HCT 34.9* 37.4 35.3*  MCV 97.2 99.2 97.2  PLT 383 338 290   Cardiac Enzymes:  Recent Labs Lab 10/10/15 0558 10/10/15 1055 10/10/15 1814  TROPONINI <0.03 0.03 <0.03   Thyroid function studies: No results for input(s): TSH, T4TOTAL, T3FREE, THYROIDAB in the last 72 hours.  Invalid input(s): FREET3 Urine analysis:    Component Value Date/Time   PHURINE 4.5 07/18/2012 1306   Microbiology No results found for this or any previous visit (from the past 240 hour(s)).  Radiology: Ct Chest High Resolution  10/11/2015  CLINICAL DATA:  65 year old female inpatient with Sjogren syndrome, uveitis and shortness of breath/wheezing refractory to antibiotic and steroid therapy. Reported history is bronchospasm. EXAM: CT CHEST WITHOUT CONTRAST TECHNIQUE: Multidetector CT imaging of the chest was performed following the standard protocol without intravenous contrast. High resolution imaging of the lungs, as well as inspiratory and expiratory imaging, was performed. COMPARISON:  10/06/2015 chest radiograph. High-resolution chest CT from 09/15/2014. FINDINGS: Mediastinum/Nodes: Normal heart size. No pericardial fluid/thickening. Great vessels are normal in course and caliber. Hypodense medial right thyroid lobe 1.0 cm nodule, not appreciably changed. Normal esophagus. No  pathologically enlarged axillary, mediastinal or gross hilar lymph nodes, noting limited sensitivity for the detection of hilar adenopathy on this noncontrast study. Lungs/Pleura: No pneumothorax. No pleural effusion. There are 3 scattered tiny subpleural pulmonary nodules in both lungs, largest 4 mm in the anterior left upper lobe (series 5/ image 84) and 3 mm in the anterior right middle lobe (series 5/ image 91), all stable since 09/15/2014 suggesting benign nodules. A few parenchymal bands in both lower lobes suggest mild platelike atelectasis and/or areas of postinfectious/postinflammatory scarring. No acute consolidative airspace disease, new significant pulmonary nodules or lung masses. There is faint patchy centrilobular micronodularity in both lungs, upper lobe predominant. No significant regions of subpleural reticulation, traction bronchiectasis, architectural distortion or frank honeycombing. No significant air trapping on the expiration sequence. Upper abdomen: Unremarkable. Musculoskeletal: No aggressive appearing focal osseous lesions. Severe dextroscoliosis in the thoracic spine, unchanged. Moderate degenerative changes in the thoracic spine. IMPRESSION: 1. Faint patchy centrilobular micronodularity in both lungs, upper lobe predominant. In the inpatient setting, this suggests a minimal infectious or inflammatory bronchiolitis, with the differential including hypersensitivity pneumonitis. No acute consolidative airspace disease to suggest a pneumonia. 2. No evidence of interstitial lung disease. Specifically no significant regions of subpleural reticulation, traction bronchiectasis or frank honeycombing. 3. Severe thoracic spine dextroscoliosis. Electronically Signed  By: Ilona Sorrel M.D.   On: 10/11/2015 15:40    Medications:   . albuterol  2.5 mg Nebulization TID  . amLODipine  5 mg Oral q1800  . aspirin  325 mg Oral Daily  . budesonide (PULMICORT) nebulizer solution  0.25 mg Nebulization  BID  . calcium citrate  200 mg of elemental calcium Oral Daily  . chlorpheniramine-HYDROcodone  5 mL Oral Q12H  . cycloSPORINE  2 drop Both Eyes Daily  . enoxaparin (LOVENOX) injection  40 mg Subcutaneous Q24H  . escitalopram  5 mg Oral Daily  . folic acid  2 mg Oral Daily  . guaiFENesin  1,200 mg Oral BID  . methotrexate  15 mg Subcutaneous Weekly  . methylPREDNISolone (SOLU-MEDROL) injection  40 mg Intravenous Q6H  . pantoprazole  40 mg Oral Daily   Continuous Infusions:   Time spent: 25 minutes.   LOS: 7 days   [facility] Pager [phone]. If unable to reach me by pager, please call my cell phone at [phone].  *Please refer to [email or web address], password [email or web address] to get updated schedule on who will round on this patient, as hospitalists switch teams weekly. If 7PM-7AM, please contact night-coverage at [email or web address], password [email or web address] for any overnight needs.  10/13/2015, 7:53 AM

## 2015-10-13 NOTE — Progress Notes (Signed)
Name: Toni Myers MRN: [id number] DOB: 1950-05-30    ADMISSION DATE:  10/06/2015 CONSULTATION DATE:  5/22  REFERRING MD :  Rama ([facility])   CHIEF COMPLAINT:  Dyspnea   BRIEF PATIENT DESCRIPTION: 65yo female never smoker with hx HTN, CKD, chronic pain, asthma, Sjogren's, RA, scoliosis with restriction on PFT's, uveitis on methotrexate presented 5/16 with 3 week hx SOB/wheezing despite outpt rx with abx and pred taper (seen in pulm office 5/11 - thought slow to resolve asthma flare, refused hospitalization).     SUBJECTIVE:  I am feeling about the same - still having coughing episodes with breathing treatments.  Denies sputum production.  VITAL SIGNS: Temp:  [98.3 F (36.8 C)-98.9 F (37.2 C)] 98.4 F (36.9 C) (05/23 0500) Pulse Rate:  [92-115] 92 (05/23 0500) Resp:  [20] 20 (05/23 0500) BP: (119-131)/(62-82) 124/73 mmHg (05/23 0500) SpO2:  [95 %-100 %] 99 % (05/23 0741) FiO2 (%):  [24 %] 24 % (05/22 1428) Weight:  [133 lb 3.2 oz (60.419 kg)] 133 lb 3.2 oz (60.419 kg) (05/23 0500)  PHYSICAL EXAMINATION: General:  Pleasant female, NAD  Neuro:  Awake, alert, appropriate, MAE  HEENT:  Mm moist, no JVD  Cardiovascular:  s1s2 rrr Lungs:  resps even non labored on 1L [place], diffuse exp wheeze, crackles LLL that do not clear with cough Abdomen:  Soft, non tender, +bs  Musculoskeletal:  Warm and dry, no edema  Skin:  No rash    Recent Labs Lab 10/07/15 0354 10/09/15 0647 10/12/15 0424 10/13/15 0455  NA 140 140 136  --   K 4.8 5.2* 4.3  --   CL 104 104 101  --   CO2 27 27 28   --   BUN 15 16 21*  --   CREATININE 0.69 0.64 0.66 0.59  GLUCOSE 123* 125* 131*  --     Recent Labs Lab 10/07/15 0354 10/09/15 0647 10/12/15 0424  HGB 11.7* 12.1 11.6*  HCT 34.9* 37.4 35.3*  WBC 7.1 7.0 8.1  PLT 383 338 290   Ct Chest High Resolution  10/11/2015  CLINICAL DATA:  65 year old female inpatient with Sjogren syndrome, uveitis and shortness of breath/wheezing refractory to antibiotic  and steroid therapy. Reported history is bronchospasm. EXAM: CT CHEST WITHOUT CONTRAST TECHNIQUE: Multidetector CT imaging of the chest was performed following the standard protocol without intravenous contrast. High resolution imaging of the lungs, as well as inspiratory and expiratory imaging, was performed. COMPARISON:  10/06/2015 chest radiograph. High-resolution chest CT from 09/15/2014. FINDINGS: Mediastinum/Nodes: Normal heart size. No pericardial fluid/thickening. Great vessels are normal in course and caliber. Hypodense medial right thyroid lobe 1.0 cm nodule, not appreciably changed. Normal esophagus. No pathologically enlarged axillary, mediastinal or gross hilar lymph nodes, noting limited sensitivity for the detection of hilar adenopathy on this noncontrast study. Lungs/Pleura: No pneumothorax. No pleural effusion. There are 3 scattered tiny subpleural pulmonary nodules in both lungs, largest 4 mm in the anterior left upper lobe (series 5/ image 84) and 3 mm in the anterior right middle lobe (series 5/ image 91), all stable since 09/15/2014 suggesting benign nodules. A few parenchymal bands in both lower lobes suggest mild platelike atelectasis and/or areas of postinfectious/postinflammatory scarring. No acute consolidative airspace disease, new significant pulmonary nodules or lung masses. There is faint patchy centrilobular micronodularity in both lungs, upper lobe predominant. No significant regions of subpleural reticulation, traction bronchiectasis, architectural distortion or frank honeycombing. No significant air trapping on the expiration sequence. Upper abdomen: Unremarkable. Musculoskeletal: No aggressive appearing  focal osseous lesions. Severe dextroscoliosis in the thoracic spine, unchanged. Moderate degenerative changes in the thoracic spine. IMPRESSION: 1. Faint patchy centrilobular micronodularity in both lungs, upper lobe predominant. In the inpatient setting, this suggests a minimal  infectious or inflammatory bronchiolitis, with the differential including hypersensitivity pneumonitis. No acute consolidative airspace disease to suggest a pneumonia. 2. No evidence of interstitial lung disease. Specifically no significant regions of subpleural reticulation, traction bronchiectasis or frank honeycombing. 3. Severe thoracic spine dextroscoliosis. Electronically Signed   By: Ilona Sorrel M.D.   On: 10/11/2015 15:40   SIGNIFICANT EVENTS  5/16  Admit  STUDIES:  CT chest 5/21 >> Faint patchy centrilobular micronodularity in both lungs, upper lobe predominant. In the inpatient setting, this suggests a minimal infectious or inflammatory bronchiolitis, with the differential including hypersensitivity pneumonitis. No acute consolidative airspace disease to suggest a pneumonia. No evidence of interstitial lung disease. Specifically no significant regions of subpleural reticulation, traction bronchiectasis or frank honeycombing. Severe thoracic spine dextroscoliosis. 2D echo 5/22 >>  ASSESSMENT / PLAN:  Dyspnea - patchy centrilobular micronodularity noted on CT is very minimal.  Doubt significant pneumonitis.   Pulmonary Infiltrate - appears to have been present 1 year ago without change Asthmatic bronchitis with flare  Hypoxemia - mild, requiring 1L O2 Restrictive / Obstructive Lung Disease - in setting of scoliosis, COPD from second hand exposure  REC -  Cont rx asthmatic bronchitis flare with steroids and taper as able  Wean off O2  2D echo - r/o pulm HTN  GERD rx  Continue BD's, budesonide, mucolytic, cough suppression  Pulmonary hygiene  Continue to monitor off abx    Noe Gens, NP-C [facility] Pgr: [phone] or if no answer [phone] 10/13/2015, 8:46 AM  STAFF NOTE: I, Merrie Roof, MD FACP have personally reviewed patient's available data, including medical history, events of note, physical examination and test results as part of my evaluation.  I have discussed with resident/NP and other care providers such as pharmacist, RN and RRT. In addition, I personally evaluated patient and elicited key findings of: no distress, does have upper airway wheezing during expiration mild, radiating to upper lungs mild, this clears well with coughing, she has received atypical ABX and multiple courses over last 2 months, NO further abx required, would assess resp viral panel for viral etiology, this is NOT flu and does NOT need isolation, would maintain solumedral at current dose now and BDers, repeat pcxr in am, this does not appear to be a sig pneumonitis nor do i feel that her autoimmune dz ia in flare, likely asthmatic bronchitic flare, CT is about unchanged and unimpressive without ground glass, also does not appear to be related to methotrexate, will assess LFT in am , if not improved would investigate other etiology then incluiding auto immune status, compliment etc   Lavon Paganini. Titus Mould, MD, [place] Pgr: [facility] 10/13/2015 11:38 AM

## 2015-10-13 NOTE — Progress Notes (Signed)
Pt called with c/o chest pain 7/10 states it woke her up from sleep.  BP 127/86 O2 sat 97 on room air, HR 100.  Pt states "this happened the other night and went away on its own with out any intervention". EKG obtained showing no acute changes. Pain lasted less than 2 minutes and resolved with out intervention.  Will continue to monitor. Jessie Foot, RN

## 2015-10-13 NOTE — Progress Notes (Signed)
O2 weaned off per SpO2 of 97%. Pt continues to cough/have wheezing during/post neb tx. RT will continue to monitor.

## 2015-10-13 NOTE — Progress Notes (Signed)
Echocardiogram 2D Echocardiogram has been performed.  Joelene Millin 10/13/2015, 12:02 PM

## 2015-10-14 ENCOUNTER — Inpatient Hospital Stay (HOSPITAL_COMMUNITY): Payer: PPO

## 2015-10-14 DIAGNOSIS — J4521 Mild intermittent asthma with (acute) exacerbation: Secondary | ICD-10-CM

## 2015-10-14 LAB — COMPREHENSIVE METABOLIC PANEL
ALBUMIN: 3.1 g/dL — AB (ref 3.5–5.0)
ALT: 18 U/L (ref 14–54)
ANION GAP: 8 (ref 5–15)
AST: 17 U/L (ref 15–41)
Alkaline Phosphatase: 52 U/L (ref 38–126)
BUN: 16 mg/dL (ref 6–20)
CHLORIDE: 100 mmol/L — AB (ref 101–111)
CO2: 27 mmol/L (ref 22–32)
Calcium: 8.4 mg/dL — ABNORMAL LOW (ref 8.9–10.3)
Creatinine, Ser: 0.63 mg/dL (ref 0.44–1.00)
GFR calc Af Amer: >60 mL/min (ref >60)
GFR calc non Af Amer: >60 mL/min (ref >60)
GLUCOSE: 129 mg/dL — AB (ref 65–99)
POTASSIUM: 4.3 mmol/L (ref 3.5–5.1)
SODIUM: 135 mmol/L (ref 135–145)
Total Bilirubin: 0.8 mg/dL (ref 0.3–1.2)
Total Protein: 5.9 g/dL — ABNORMAL LOW (ref 6.5–8.1)

## 2015-10-14 LAB — GLUCOSE, CAPILLARY: Glucose-Capillary: 136 mg/dL — ABNORMAL HIGH (ref 65–99)

## 2015-10-14 LAB — CBC WITH DIFFERENTIAL/PLATELET
Basophils Absolute: 0 10*3/uL (ref 0.0–0.1)
Basophils Relative: 0 %
EOS ABS: 0 10*3/uL (ref 0.0–0.7)
Eosinophils Relative: 0 %
HCT: 33.9 % — ABNORMAL LOW (ref 36.0–46.0)
HEMOGLOBIN: 11.2 g/dL — AB (ref 12.0–15.0)
Lymphocytes Relative: 2 %
Lymphs Abs: 0.2 10*3/uL — ABNORMAL LOW (ref 0.7–4.0)
MCH: 31.6 pg (ref 26.0–34.0)
MCHC: 33 g/dL (ref 30.0–36.0)
MCV: 95.8 fL (ref 78.0–100.0)
MONO ABS: 0.2 10*3/uL (ref 0.1–1.0)
MONOS PCT: 3 %
NEUTROS PCT: 95 %
Neutro Abs: 8.2 10*3/uL — ABNORMAL HIGH (ref 1.7–7.7)
PLATELETS: 255 10*3/uL (ref 150–400)
RBC: 3.54 MIL/uL — ABNORMAL LOW (ref 3.87–5.11)
RDW: 15.2 % (ref 11.5–15.5)
WBC: 8.6 10*3/uL (ref 4.0–10.5)

## 2015-10-14 MED ORDER — GI COCKTAIL ~~LOC~~: 30.0000 mL | Freq: Three times a day (TID) | ORAL | Status: DC | PRN

## 2015-10-14 MED ORDER — GUAIFENESIN 100 MG/5ML PO SOLN: 5.0000 mL | ORAL | Status: DC | PRN

## 2015-10-14 MED ORDER — METHYLPREDNISOLONE SODIUM SUCC 40 MG IJ SOLR
40.0000 mg | Freq: Every day | INTRAMUSCULAR | Status: DC
Start: 2015-10-15 — End: 2015-10-15
  Administered 2015-10-15: 40 mg via INTRAVENOUS
  Filled 2015-10-14: qty 1

## 2015-10-14 NOTE — Progress Notes (Signed)
Pt will remain on nebs due to wheezing after breathing tx.

## 2015-10-14 NOTE — Care Management Important Message (Signed)
Important Message  Patient Details  Name: Toni Myers MRN: [id number] Date of Birth: [birth year]   Medicare Important Message Given:  Yes    Loann Quill 10/14/2015, 11:25 AM

## 2015-10-14 NOTE — Evaluation (Signed)
Physical Therapy Evaluation Patient Details Name: Toni Myers MRN: [id number] DOB: [birth year] Today's Date: 10/14/2015   History of Present Illness  65 y/o female, never smoker (2nd hand smoke) with hx HTN, CKD, chronic pain, asthma, Sjogren's, RA on methotrexate, scoliosis with restriction on PFT's, uveitis who presented 5/16 with 3 week hx SOB/wheezing   Clinical Impression  Patient demonstrates deficits in functional mobility as indicated below. Will need continued skilled PT to address deficits and maximize function. Will see as indicated and progress as tolerated. OF NOTE: Patient with some instability noted during ambulation, multiple balance checks but able to self correct, min guard for safety. Ambulated on room air with saturations remaining stable >92% throughout.     Follow Up Recommendations No PT follow up;Supervision - Intermittent    Equipment Recommendations  None recommended by PT    Recommendations for Other Services       Precautions / Restrictions Precautions Precautions: Fall Precaution Comments: watch O2 saturations      Mobility  Bed Mobility Overal bed mobility: Modified Independent             General bed mobility comments: increased time to perform, no physical assist required  Transfers Overall transfer level: Needs assistance Equipment used: None Transfers: Sit to/from Stand Sit to Stand: Min guard         General transfer comment: min guard for safety and stability  Ambulation/Gait Ambulation/Gait assistance: Min guard Ambulation Distance (Feet): 160 Feet Assistive device: None Gait Pattern/deviations: Step-through pattern;Decreased stride length;Drifts right/left;Narrow base of support Gait velocity: decreased Gait velocity interpretation: Below normal speed for age/gender General Gait Details: patient with some instability noted during ambulation, multiple balance checks but able to self correct, min guard for safety. Ambulated on  room air with saturations remaining stable >92% throughout.   Stairs            Wheelchair Mobility    Modified Rankin (Stroke Patients Only)       Balance Overall balance assessment: Needs assistance                           High level balance activites: Direction changes;Turns;Head turns High Level Balance Comments: min guard with one episode of min assist during higher level balance tasks             Pertinent Vitals/Pain Pain Assessment: No/denies pain    Home Living Family/patient expects to be discharged to:: Private residence Living Arrangements: Alone Available Help at Discharge: Family Type of Home: House Home Access: Stairs to enter Entrance Stairs-Rails: Can reach both Entrance Stairs-Number of Steps: 4 Home Layout: One level        Prior Function Level of Independence: Independent with assistive device(s)               Hand Dominance   Dominant Hand: Right    Extremity/Trunk Assessment   Upper Extremity Assessment: Overall WFL for tasks assessed           Lower Extremity Assessment: Generalized weakness         Communication   Communication: No difficulties  Cognition Arousal/Alertness: Awake/alert Behavior During Therapy: WFL for tasks assessed/performed Overall Cognitive Status: Within Functional Limits for tasks assessed                      General Comments      Exercises        Assessment/Plan    PT Assessment  Patient needs continued PT services  PT Diagnosis Difficulty walking;Generalized weakness   PT Problem List Decreased strength;Decreased activity tolerance;Decreased balance;Decreased mobility;Decreased safety awareness;Cardiopulmonary status limiting activity  PT Treatment Interventions DME instruction;Gait training;Stair training;Functional mobility training;Therapeutic activities;Therapeutic exercise;Balance training;Patient/family education   PT Goals (Current goals can be found  in the Care Plan section) Acute Rehab PT Goals Patient Stated Goal: to go home PT Goal Formulation: With patient Time For Goal Achievement: 10/28/15 Potential to Achieve Goals: Good    Frequency Min 3X/week   Barriers to discharge Decreased caregiver support      Co-evaluation               End of Session Equipment Utilized During Treatment: Gait belt Activity Tolerance: Patient limited by fatigue Patient left: in bed;with call bell/phone within reach;with bed alarm set Nurse Communication: Mobility status         Time: [id number] PT Time Calculation (min) (ACUTE ONLY): 19 min   Charges:   PT Evaluation $PT Eval Moderate Complexity: 1 Procedure     PT G CodesDuncan Dull 2015-10-26, 5:17 PM Alben Deeds, [place] DPT  [phone]

## 2015-10-14 NOTE — Progress Notes (Signed)
Progress Note    Toni Myers  [id number] DOB: 05-May-1951  DOA: 10/06/2015 PCP: Fae Pippin    Brief Narrative:   Toni Myers is an 65 y.o. female with a PMH of asthmatic bronchitis, Sjogren's syndrome, uveitis and hypertension who was admitted 10/06/15 the chief complaint of a 3 week history of persistent shortness of breath/wheezing after failing outpatient treatment including 2 courses of antibiotics as well as a prednisone taper. Upon initial presentation, patient had acute respiratory failure requiring BiPAP.  Assessment/Plan:   Principal Problem:   Acute respiratory failure with hypoxia (HCC) secondary to asthmatic bronchitis with acute exacerbation Patient was admitted and placed on BiPAP, IV Solu-Medrol, nebulized bronchodilator treatments, nebulized Pulmicort, and empiric Levaquin. Still has a lot of bronchospasm, dyspnea.  Continue Mucinex. Solu-Medrol dose increased 10/09/15. Repeat magnesium.  High resolution CT done 10/11/15 and showed findings consistent with an infectious or inflammatory bronchiolitis versus hypersensitivity pneumonitis. Pulmonology consultation performed 10/12/15. She has responded well to IV steroids, plan to transition to oral steroids and a slow taper for the next two weeks. With outpatient follow up with pulmonology.     Active Problems:   Low TSH Free T4 WNL.    Palpitations/chest tightness The patient reported palpitations accompanied by chest tightness overnight 10/10/15 and again 10/13/15. 12-lead EKG showed flipped T waves. Troponins negative 4. Continue aspirin. Resolved after a few minutes. No more chest pain or palpitations today.     Stage II chronic kidney disease Renal function stable. Baseline creatinine 0.5-0.77.    Essential hypertension Patient was continued on her usual home dose of amlodipine. Blood pressure reasonable.    Rheumatoid arthritis ([place]) On weekly methotrexate. Continue folic acid.    Sjogren's  syndrome Continue Restasis.   Family Communication/Anticipated D/C date and plan/Code Status   DVT prophylaxis: Lovenox ordered. Code Status: Full Code.  Family Communication: none at bedside.  Disposition Plan: Home tomorrow after PT eval.    Medical Consultants:    Pulmonology  Procedures:    2-D echocardiogram  Anti-Infectives:   Levaquin 10/06/15--->10/12/15  Subjective:   Toni Myers  Reports sheis much better. Wants to go home.    Objective:    Filed Vitals:   10/13/15 2130 10/14/15 0400 10/14/15 0828 10/14/15 0834  BP: 115/68 140/84    Pulse: 89 81    Temp: 98.3 F (36.8 C) 98.2 F (36.8 C)    TempSrc: Oral Oral    Resp: 18 18    Height:      Weight:  61.054 kg (134 lb 9.6 oz)    SpO2: 95% 96% 98% 98%    Intake/Output Summary (Last 24 hours) at 10/14/15 1748 Last data filed at 10/14/15 0900  Gross per 24 hour  Intake    120 ml  Output      0 ml  Net    120 ml   Filed Weights   10/12/15 0520 10/13/15 0500 10/14/15 0400  Weight: 60.51 kg (133 lb 6.4 oz) 60.419 kg (133 lb 3.2 oz) 61.054 kg (134 lb 9.6 oz)    Exam: General exam: Appears comfortable. Respiratory system: no wheezing heard. Good air entry.  Cardiovascular system: S1 & S2 heard, mildly tacychardic. No JVD,  rubs, gallops or clicks. No murmurs. Gastrointestinal system: Abdomen is nondistended, soft and nontender. No organomegaly or masses felt. Normal bowel sounds heard. Central nervous system: Alert and oriented. No focal neurological deficits. Extremities: No clubbing, edema, or cyanosis. Skin: No rashes, lesions or ulcers  Psychiatry: Judgement and insight appear normal. Mood & affect appropriate.   Data Reviewed:   I have personally reviewed following labs and imaging studies:  Labs: Basic Metabolic Panel:  Recent Labs Lab 10/09/15 0647 10/12/15 0424 10/13/15 0455 10/14/15 0400  NA 140 136  --  135  K 5.2* 4.3  --  4.3  CL 104 101  --  100*  CO2 27 28  --  27   GLUCOSE 125* 131*  --  129*  BUN 16 21*  --  16  CREATININE 0.64 0.66 0.59 0.63  CALCIUM 9.3 8.7*  --  8.4*   GFR Estimated Creatinine Clearance: 57.2 mL/min (by C-G formula based on Cr of 0.63). Liver Function Tests:  Recent Labs Lab 10/14/15 0400  AST 17  ALT 18  ALKPHOS 52  BILITOT 0.8  PROT 5.9*  ALBUMIN 3.1*   CBC:  Recent Labs Lab 10/09/15 0647 10/12/15 0424 10/14/15 0400  WBC 7.0 8.1 8.6  NEUTROABS  --   --  8.2*  HGB 12.1 11.6* 11.2*  HCT 37.4 35.3* 33.9*  MCV 99.2 97.2 95.8  PLT 338 290 255   Cardiac Enzymes:  Recent Labs Lab 10/10/15 0558 10/10/15 1055 10/10/15 1814  TROPONINI <0.03 0.03 <0.03   Thyroid function studies: No results for input(s): TSH, T4TOTAL, T3FREE, THYROIDAB in the last 72 hours.  Invalid input(s): FREET3 Urine analysis:    Component Value Date/Time   PHURINE 4.5 07/18/2012 1306   Microbiology Recent Results (from the past 240 hour(s))  Respiratory Panel by PCR     Status: None   Collection Time: 10/13/15 12:57 PM  Result Value Ref Range Status   Adenovirus NOT DETECTED NOT DETECTED Final   Coronavirus 229E NOT DETECTED NOT DETECTED Final   Coronavirus HKU1 NOT DETECTED NOT DETECTED Final   Coronavirus NL63 NOT DETECTED NOT DETECTED Final   Coronavirus OC43 NOT DETECTED NOT DETECTED Final   Metapneumovirus NOT DETECTED NOT DETECTED Final   Rhinovirus / Enterovirus NOT DETECTED NOT DETECTED Final   Influenza A NOT DETECTED NOT DETECTED Final   Influenza A H1 NOT DETECTED NOT DETECTED Final   Influenza A H1 2009 NOT DETECTED NOT DETECTED Final   Influenza A H3 NOT DETECTED NOT DETECTED Final   Influenza B NOT DETECTED NOT DETECTED Final   Parainfluenza Virus 1 NOT DETECTED NOT DETECTED Final   Parainfluenza Virus 2 NOT DETECTED NOT DETECTED Final   Parainfluenza Virus 3 NOT DETECTED NOT DETECTED Final   Parainfluenza Virus 4 NOT DETECTED NOT DETECTED Final   Respiratory Syncytial Virus NOT DETECTED NOT DETECTED Final    Bordetella pertussis NOT DETECTED NOT DETECTED Final   Chlamydophila pneumoniae NOT DETECTED NOT DETECTED Final   Mycoplasma pneumoniae NOT DETECTED NOT DETECTED Final    Radiology: Dg Chest 2 View  10/14/2015  CLINICAL DATA:  History of asthmatic bronchitis, COPD, rheumatoid arthritis EXAM: CHEST  2 VIEW COMPARISON:  PA and lateral chest x-ray of Oct 06, 2015 FINDINGS: The lungs are mildly hyperinflated there is stable mild blunting of the left lateral costophrenic angle. The heart is normal in size. The pulmonary vascularity is not engorged. There is severe dextrocurvature of the mid thoracic spine. IMPRESSION: Hyperinflation consistent with COPD -reactive airway disease. There is no pneumonia nor CHF. Electronically Signed   By: David  Martinique M.D.   On: 10/14/2015 07:27    Medications:   . albuterol  2.5 mg Nebulization TID  . amLODipine  5 mg Oral q1800  . aspirin  325 mg Oral Daily  . budesonide (PULMICORT) nebulizer solution  0.25 mg Nebulization BID  . calcium citrate  200 mg of elemental calcium Oral Daily  . chlorpheniramine-HYDROcodone  5 mL Oral Q12H  . cycloSPORINE  2 drop Both Eyes Daily  . docusate sodium  100 mg Oral BID  . escitalopram  5 mg Oral Daily  . folic acid  2 mg Oral Daily  . guaiFENesin  1,200 mg Oral BID  . methotrexate  15 mg Subcutaneous Weekly  . [START ON 10/15/2015] methylPREDNISolone (SOLU-MEDROL) injection  40 mg Intravenous Daily  . pantoprazole  40 mg Oral Daily   Continuous Infusions:   Time spent: 25 minutes.   LOS: 8 days   [facility] Pager [phone]. If unable to reach me by pager, please call my cell phone at [phone].  *Please refer to [email or web address], password [email or web address] to get updated schedule on who will round on this patient, as hospitalists switch teams weekly. If 7PM-7AM, please contact night-coverage at [email or web address], password [email or web address] for any overnight needs.  10/14/2015, 5:48 PM

## 2015-10-14 NOTE — Progress Notes (Signed)
Name: Toni Myers MRN: [id number] DOB: 1950-12-15    ADMISSION DATE:  10/06/2015 CONSULTATION DATE:  5/22  REFERRING MD :  Rama ([facility])   CHIEF COMPLAINT:  Dyspnea   BRIEF PATIENT DESCRIPTION: 65 y/o female, never smoker (2nd hand smoke) with hx HTN, CKD, chronic pain, asthma, Sjogren's, RA on methotrexate, scoliosis with restriction on PFT's, uveitis who presented 5/16 with 3 week hx SOB/wheezing despite outpt rx with abx and pred taper (seen in pulm office 5/11 - thought slow to resolve asthma flare, refused hospitalization).     SUBJECTIVE:  Pt reports feeling better. Off O2, tolerating activity in room without significant difficulty.  Reports two episodes of chest pain (points to central / substernal chest), describes as sharp and burning  VITAL SIGNS: Temp:  [98.2 F (36.8 C)-98.9 F (37.2 C)] 98.2 F (36.8 C) (05/24 0400) Pulse Rate:  [81-107] 81 (05/24 0400) Resp:  [18] 18 (05/24 0400) BP: (115-140)/(68-89) 140/84 mmHg (05/24 0400) SpO2:  [95 %-98 %] 98 % (05/24 0834) Weight:  [134 lb 9.6 oz (61.054 kg)] 134 lb 9.6 oz (61.054 kg) (05/24 0400)  PHYSICAL EXAMINATION: General:  Pleasant female, NAD  Neuro:  Awake, alert, appropriate, MAE  HEENT:  Mm moist, no JVD  Cardiovascular:  s1s2 rrr Lungs:  resps even non labored, wheezing improved, crackles LLL that do not clear with cough Abdomen:  Soft, non tender, +bs  Musculoskeletal:  Warm and dry, no edema  Skin:  No rash    Recent Labs Lab 10/09/15 0647 10/12/15 0424 10/13/15 0455 10/14/15 0400  NA 140 136  --  135  K 5.2* 4.3  --  4.3  CL 104 101  --  100*  CO2 27 28  --  27  BUN 16 21*  --  16  CREATININE 0.64 0.66 0.59 0.63  GLUCOSE 125* 131*  --  129*    Recent Labs Lab 10/09/15 0647 10/12/15 0424 10/14/15 0400  HGB 12.1 11.6* 11.2*  HCT 37.4 35.3* 33.9*  WBC 7.0 8.1 8.6  PLT 338 290 255    IMAGING:  5/24 images personally reviewed, no evidence of CHF or infiltrate, scoliosis  Dg Chest 2  View  10/14/2015  CLINICAL DATA:  History of asthmatic bronchitis, COPD, rheumatoid arthritis EXAM: CHEST  2 VIEW COMPARISON:  PA and lateral chest x-ray of Oct 06, 2015 FINDINGS: The lungs are mildly hyperinflated there is stable mild blunting of the left lateral costophrenic angle. The heart is normal in size. The pulmonary vascularity is not engorged. There is severe dextrocurvature of the mid thoracic spine. IMPRESSION: Hyperinflation consistent with COPD -reactive airway disease. There is no pneumonia nor CHF. Electronically Signed   By: David  Martinique M.D.   On: 10/14/2015 07:27     SIGNIFICANT EVENTS  5/16  Admit  STUDIES:  CT chest 5/21 >> Faint patchy centrilobular micronodularity in both lungs, upper lobe predominant. In the inpatient setting, this suggests a minimal infectious or inflammatory bronchiolitis, with the differential including hypersensitivity pneumonitis. No acute consolidative airspace disease to suggest a pneumonia. No evidence of interstitial lung disease. Specifically no significant regions of subpleural reticulation, traction bronchiectasis or frank honeycombing. Severe thoracic spine dextroscoliosis. 2D ECHO 5/22 >> LVEF 60-65%, normal wall motion, grade 1 diastolic dysfunction, no evidence of PH  ASSESSMENT / PLAN:  Dyspnea - patchy centrilobular micronodularity noted on CT is very minimal.  Doubt significant pneumonitis.   Pulmonary Infiltrate - appears to have been present 1 year ago without change Asthmatic  bronchitis with flare  Hypoxemia - mild, requiring 1L O2 Restrictive / Obstructive Lung Disease - in setting of scoliosis, COPD from second hand exposure Chest Pain - ? If related to gastritis / reflux with high dose steroids.  EKG reviewed from 5/23 > NSR   REC -  Cont rx asthmatic bronchitis flare  Continue steroids, reduce to 40 mg QD 5/24 ECHO as above, no evidence of [place] [facility] hygiene GERD rx >> add GI cocktail PRN for chest pain  Continue BD's,  budesonide, mucolytic, cough suppression  Continue to monitor off abx  LFT's reviewed, within normal limits  CXR reviewed 5/24 Pulmonary follow up in office post discharge arranged (had an appt 5/25, canceled and rescheduled)  Noe Gens, NP-C [facility] Pgr: [phone] or if no answer [phone] 10/14/2015, 9:40 AM   STAFF NOTE: I, Merrie Roof, MD FACP have personally reviewed patient's available data, including medical history, events of note, physical examination and test results as part of my evaluation. I have discussed with resident/NP and other care providers such as pharmacist, RN and RRT. In addition, I personally evaluated patient and elicited key findings of: Much improved status, less wheezing, no ronchi, slight nt wheeze expiration neck, pcxr resolving int changes, viral panel neg, likely asthmatic bronchitis flare from infection, treated with abx as outpt, she has responded well to IV steroids, would have very slow taper over 2 weeks, her O2 needs also are improved, I still have concerns for vocal cord dysfxn, which may be related to inflammation from whatver started this in lungs or primary issue, I explained that she needs follow up with Dr Lamonte Sakai  And possible PFT repeat, will sign off, call if needed, needs follow up immediate Dr Lorrin Mais. Titus Mould, MD, [place] Pgr: [facility] 10/14/2015 2:05 PM

## 2015-10-15 ENCOUNTER — Ambulatory Visit: Payer: PPO | Admitting: Adult Health

## 2015-10-15 LAB — GLUCOSE, CAPILLARY: Glucose-Capillary: 75 mg/dL (ref 65–99)

## 2015-10-15 MED ORDER — PREDNISONE 20 MG PO TABS: ORAL_TABLET | ORAL | Status: DC

## 2015-10-15 MED ORDER — PREDNISONE 20 MG PO TABS: 40.0000 mg | ORAL_TABLET | Freq: Every day | ORAL | Status: DC

## 2015-10-15 MED ORDER — ALBUTEROL SULFATE (2.5 MG/3ML) 0.083% IN NEBU: 2.5000 mg | INHALATION_SOLUTION | Freq: Two times a day (BID) | RESPIRATORY_TRACT | Status: DC

## 2015-10-15 NOTE — Plan of Care (Signed)
Problem: Education: Goal: Knowledge of [facility] Education information/materials will improve Outcome: Completed/Met Date Met:  10/15/15 Pt educated throughout entire hospitalization regarding tests, procedures, medications, and available resources.   Problem: Phase II Progression Outcomes Goal: O2 sats > equal to 90% on RA or at baseline Outcome: Completed/Met Date Met:  10/15/15 Pt room sats in 90's on room air Goal: Pain controlled on oral analgesia Outcome: Completed/Met Date Met:  10/15/15 Pt remains pain free at this time Goal: Dyspnea controlled w/progressive activity Outcome: Completed/Met Date Met:  10/15/15 Pt dyspnea is being controlled with rest Goal: ADLs completed with minimal assistance Outcome: Completed/Met Date Met:  10/15/15 Pt able to complete ADLs with minimal assistance  Goal: Tolerating diet Outcome: Completed/Met Date Met:  10/15/15 Pt able to tolerate current diet with no difficulties  Goal: Discharge plan remains appropriate-arrangements made Outcome: Completed/Met Date Met:  10/15/15 Pt being discharged home today  Problem: Discharge Progression Outcomes Goal: Able to self administer respiratory meds Outcome: Completed/Met Date Met:  10/15/15 Pt is able to self administer medications with no issues Goal: Discharge plan in place and appropriate Outcome: Completed/Met Date Met:  10/15/15 Pt being discharged home today Goal: Pain controlled with appropriate interventions Outcome: Completed/Met Date Met:  10/15/15 Pt remains pain free Goal: Tolerating diet Outcome: Completed/Met Date Met:  10/15/15 Pt able to tolerate diet with no difficulties

## 2015-10-15 NOTE — Discharge Summary (Signed)
Physician Discharge Summary  Toni Myers [id number] DOB: 1950/05/27 DOA: 10/06/2015  PCP: Fae Pippin  Admit date: 10/06/2015 Discharge date: 10/15/2015  Time spent: 30 minutes  Recommendations for Outpatient Follow-up:  1. Follow up with PCP in 2 weeks.  2. Follow up with pulmonology as recommended.    Discharge Diagnoses:  Principal Problem:   Acute respiratory failure with hypoxia (HCC) Active Problems:   Essential hypertension   Cough   Sjogren's disease (HCC)   Rheumatoid arthritis (HCC)   Asthmatic bronchitis   COPD exacerbation (HCC)   CKD (chronic kidney disease), stage II   Low TSH level   Palpitations   Chest tightness   Bronchospasm   Bronchospasm with bronchitis, acute   Chest congestion   Discharge Condition: improved  Diet recommendation: low sodium diet.   Filed Weights   10/13/15 0500 10/14/15 0400 10/15/15 0545  Weight: 60.419 kg (133 lb 3.2 oz) 61.054 kg (134 lb 9.6 oz) 61.009 kg (134 lb 8 oz)    History of present illness:  Toni Myers is an 65 y.o. female with a PMH of asthmatic bronchitis, Sjogren's syndrome, uveitis and hypertension who was admitted 10/06/15 the chief complaint of a 3 week history of persistent shortness of breath/wheezing after failing outpatient treatment including 2 courses of antibiotics as well as a prednisone taper. Upon initial presentation, patient had acute respiratory failure requiring BiPAP. She is currently off oxygen and is on RA with good oxygen saturations. She has been transitioned to po steroids.   Hospital Course:  Acute respiratory failure with hypoxia (HCC) secondary to asthmatic bronchitis with acute exacerbation Patient was admitted and placed on BiPAP, IV Solu-Medrol, nebulized bronchodilator treatments, nebulized Pulmicort, and empiric Levaquin. Continue Mucinex. High resolution CT done 10/11/15 and showed findings consistent with an infectious or inflammatory bronchiolitis versus hypersensitivity  pneumonitis. Pulmonology consultation performed 10/12/15. She has responded well to IV steroids, plan to transition to oral steroids tomorrow and a slow taper for the next two weeks. With outpatient follow up with pulmonology. Appointment has already been made. She is currently on RA with good sats at rest and on ambulation.     Active Problems:  Low TSH Free T4 WNL.   Palpitations/chest tightness The patient reported palpitations accompanied by chest tightness overnight 10/10/15 and again 10/13/15. 12-lead EKG showed flipped T waves. Troponins negative 4. Continue aspirin. Resolved after a few minutes. No more chest pain or palpitations on discharge.    Stage II chronic kidney disease Renal function stable. Baseline creatinine 0.5-0.77.   Essential hypertension Patient was continued on her usual home dose of amlodipine. Blood pressure reasonable.   Rheumatoid arthritis ([place]) On weekly methotrexate. Continue folic acid.   Sjogren's syndrome Continue Restasis.  Procedures:  NONE  Consultations:  PCCM    Discharge Exam: Filed Vitals:   10/15/15 0800 10/15/15 1128  BP: 130/82 120/60  Pulse: 88 88  Temp: 97.8 F (36.6 C) 98.5 F (36.9 C)  Resp:      General: alet comfortable.  Cardiovascular: s1s2 Respiratory: ctab  Discharge Instructions   Discharge Instructions    Diet general    Complete by:  As directed      Discharge instructions    Complete by:  As directed   Please follow u with pulmonology as recommended. You have an appointment already scheduled.          Current Discharge Medication List    START taking these medications   Details  guaiFENesin (ROBITUSSIN) 100 MG/5ML SOLN  Take 5 mLs (100 mg total) by mouth every 4 (four) hours as needed for cough or to loosen phlegm. Qty: 1200 mL, Refills: 0      CONTINUE these medications which have CHANGED   Details  predniSONE (DELTASONE) 20 MG tablet Prednisone 40 mg daily for 1 days followed  by Prednisone 30 mg daily for 3 days followed by Prednisone 20 mg daily for 3 days followed by Prednisone 10 mg daily for 3 days and stop. Qty: 12 tablet, Refills: 0      CONTINUE these medications which have NOT CHANGED   Details  albuterol (PROAIR HFA) 108 (90 BASE) MCG/ACT inhaler Inhale 2 puffs into the lungs every 6 (six) hours as needed for wheezing or shortness of breath.     albuterol (PROVENTIL) (2.5 MG/3ML) 0.083% nebulizer solution Take 2.5 mg by nebulization every 4 (four) hours as needed for wheezing or shortness of breath.     amLODipine (NORVASC) 5 MG tablet Take 1 tablet by mouth daily.    budesonide-formoterol (SYMBICORT) 160-4.5 MCG/ACT inhaler Inhale 2 puffs into the lungs 2 (two) times daily as needed (SOB).     calcium citrate (CALCITRATE - DOSED IN MG ELEMENTAL CALCIUM) 950 MG tablet Take 200 mg of elemental calcium by mouth daily.    cyclobenzaprine (FLEXERIL) 10 MG tablet Take 1 tablet (10 mg total) by mouth at bedtime as needed. For spasms Qty: 20 tablet, Refills: 0    escitalopram (LEXAPRO) 5 MG tablet Take 5 mg by mouth daily.    esomeprazole (NEXIUM 24HR) 20 MG capsule Take 20 mg by mouth daily at 12 noon.    FOLIC ACID PO Take 2 tablets by mouth daily.     methotrexate 250 MG/10ML injection Inject 15 mg into the vein once. Injects 0.6 ml subcutaneously every Wed (in stomach).    PRESCRIPTION MEDICATION Place 1 drop into both eyes 4 (four) times daily. Eye drop made from pt's own blood    RESTASIS 0.05 % ophthalmic emulsion Place 2 drops into both eyes daily.      STOP taking these medications     azithromycin (ZITHROMAX) 250 MG tablet      clotrimazole (MYCELEX) 10 MG troche      HYDROcodone-homatropine (HYCODAN) 5-1.5 MG/5ML syrup      levofloxacin (LEVAQUIN) 500 MG tablet        Allergies  Allergen Reactions  . Morphine And Related Nausea And Vomiting    "makes me sick at high doses"   Follow-up Information    Follow up with Rexene Edison, NP On 10/22/2015.   Specialty:  Pulmonary Disease   Why:  Appt at 3PM for hospital follow up   Contact information:   [address] [place] [address] [phone]       Follow up with CONROY,NATHAN, PA-C. Schedule an appointment as soon as possible for a visit in 2 weeks.   Specialty:  Physician Assistant   Contact information:   [place] [address] [phone]        The results of significant diagnostics from this hospitalization (including imaging, microbiology, ancillary and laboratory) are listed below for reference.    Significant Diagnostic Studies: Dg Chest 2 View  10/14/2015  CLINICAL DATA:  History of asthmatic bronchitis, COPD, rheumatoid arthritis EXAM: CHEST  2 VIEW COMPARISON:  PA and lateral chest x-ray of Oct 06, 2015 FINDINGS: The lungs are mildly hyperinflated there is stable mild blunting of the left lateral costophrenic angle. The heart  is normal in size. The pulmonary vascularity is not engorged. There is severe dextrocurvature of the mid thoracic spine. IMPRESSION: Hyperinflation consistent with COPD -reactive airway disease. There is no pneumonia nor CHF. Electronically Signed   By: David  Martinique M.D.   On: 10/14/2015 07:27   Dg Chest 2 View  10/06/2015  CLINICAL DATA:  Shortness of breath and wheezing for couple of weeks. History of hypertension and COPD. EXAM: CHEST  2 VIEW COMPARISON:  09/28/2015 FINDINGS: Thoracolumbar scoliosis convex towards the right. Normal heart size and pulmonary vascularity. Hyperinflation. No focal airspace disease or consolidation in the lungs. No blunting of costophrenic angles. No pneumothorax. Mediastinal contours appear intact. IMPRESSION: No active cardiopulmonary disease. Electronically Signed   By: Lucienne Capers M.D.   On: 10/06/2015 01:16   Dg Chest 2 View  09/28/2015  CLINICAL DATA:  Cough, congestion, shortness of breath and chest pain for 5 days. EXAM: CHEST  2 VIEW COMPARISON:  08/04/2014  FINDINGS: Severe thoracic scoliosis. Old healed right rib fractures. Mild hyperinflation of the lungs. Heart is normal size. No effusions. No acute bony abnormality. IMPRESSION: Mild hyperinflation.  No active disease. Electronically Signed   By: Rolm Baptise M.D.   On: 09/28/2015 14:01   Ct Chest High Resolution  10/11/2015  CLINICAL DATA:  65 year old female inpatient with Sjogren syndrome, uveitis and shortness of breath/wheezing refractory to antibiotic and steroid therapy. Reported history is bronchospasm. EXAM: CT CHEST WITHOUT CONTRAST TECHNIQUE: Multidetector CT imaging of the chest was performed following the standard protocol without intravenous contrast. High resolution imaging of the lungs, as well as inspiratory and expiratory imaging, was performed. COMPARISON:  10/06/2015 chest radiograph. High-resolution chest CT from 09/15/2014. FINDINGS: Mediastinum/Nodes: Normal heart size. No pericardial fluid/thickening. Great vessels are normal in course and caliber. Hypodense medial right thyroid lobe 1.0 cm nodule, not appreciably changed. Normal esophagus. No pathologically enlarged axillary, mediastinal or gross hilar lymph nodes, noting limited sensitivity for the detection of hilar adenopathy on this noncontrast study. Lungs/Pleura: No pneumothorax. No pleural effusion. There are 3 scattered tiny subpleural pulmonary nodules in both lungs, largest 4 mm in the anterior left upper lobe (series 5/ image 84) and 3 mm in the anterior right middle lobe (series 5/ image 91), all stable since 09/15/2014 suggesting benign nodules. A few parenchymal bands in both lower lobes suggest mild platelike atelectasis and/or areas of postinfectious/postinflammatory scarring. No acute consolidative airspace disease, new significant pulmonary nodules or lung masses. There is faint patchy centrilobular micronodularity in both lungs, upper lobe predominant. No significant regions of subpleural reticulation, traction  bronchiectasis, architectural distortion or frank honeycombing. No significant air trapping on the expiration sequence. Upper abdomen: Unremarkable. Musculoskeletal: No aggressive appearing focal osseous lesions. Severe dextroscoliosis in the thoracic spine, unchanged. Moderate degenerative changes in the thoracic spine. IMPRESSION: 1. Faint patchy centrilobular micronodularity in both lungs, upper lobe predominant. In the inpatient setting, this suggests a minimal infectious or inflammatory bronchiolitis, with the differential including hypersensitivity pneumonitis. No acute consolidative airspace disease to suggest a pneumonia. 2. No evidence of interstitial lung disease. Specifically no significant regions of subpleural reticulation, traction bronchiectasis or frank honeycombing. 3. Severe thoracic spine dextroscoliosis. Electronically Signed   By: Ilona Sorrel M.D.   On: 10/11/2015 15:40    Microbiology: Recent Results (from the past 240 hour(s))  Respiratory Panel by PCR     Status: None   Collection Time: 10/13/15 12:57 PM  Result Value Ref Range Status   Adenovirus NOT DETECTED NOT DETECTED Final  Coronavirus 229E NOT DETECTED NOT DETECTED Final   Coronavirus HKU1 NOT DETECTED NOT DETECTED Final   Coronavirus NL63 NOT DETECTED NOT DETECTED Final   Coronavirus OC43 NOT DETECTED NOT DETECTED Final   Metapneumovirus NOT DETECTED NOT DETECTED Final   Rhinovirus / Enterovirus NOT DETECTED NOT DETECTED Final   Influenza A NOT DETECTED NOT DETECTED Final   Influenza A H1 NOT DETECTED NOT DETECTED Final   Influenza A H1 2009 NOT DETECTED NOT DETECTED Final   Influenza A H3 NOT DETECTED NOT DETECTED Final   Influenza B NOT DETECTED NOT DETECTED Final   Parainfluenza Virus 1 NOT DETECTED NOT DETECTED Final   Parainfluenza Virus 2 NOT DETECTED NOT DETECTED Final   Parainfluenza Virus 3 NOT DETECTED NOT DETECTED Final   Parainfluenza Virus 4 NOT DETECTED NOT DETECTED Final   Respiratory Syncytial  Virus NOT DETECTED NOT DETECTED Final   Bordetella pertussis NOT DETECTED NOT DETECTED Final   Chlamydophila pneumoniae NOT DETECTED NOT DETECTED Final   Mycoplasma pneumoniae NOT DETECTED NOT DETECTED Final     Labs: Basic Metabolic Panel:  Recent Labs Lab 10/09/15 0647 10/12/15 0424 10/13/15 0455 10/14/15 0400  NA 140 136  --  135  K 5.2* 4.3  --  4.3  CL 104 101  --  100*  CO2 27 28  --  27  GLUCOSE 125* 131*  --  129*  BUN 16 21*  --  16  CREATININE 0.64 0.66 0.59 0.63  CALCIUM 9.3 8.7*  --  8.4*   Liver Function Tests:  Recent Labs Lab 10/14/15 0400  AST 17  ALT 18  ALKPHOS 52  BILITOT 0.8  PROT 5.9*  ALBUMIN 3.1*   No results for input(s): LIPASE, AMYLASE in the last 168 hours. No results for input(s): AMMONIA in the last 168 hours. CBC:  Recent Labs Lab 10/09/15 0647 10/12/15 0424 10/14/15 0400  WBC 7.0 8.1 8.6  NEUTROABS  --   --  8.2*  HGB 12.1 11.6* 11.2*  HCT 37.4 35.3* 33.9*  MCV 99.2 97.2 95.8  PLT 338 290 255   Cardiac Enzymes:  Recent Labs Lab 10/10/15 0558 10/10/15 1055 10/10/15 1814  TROPONINI <0.03 0.03 <0.03   BNP: BNP (last 3 results) No results for input(s): BNP in the last 8760 hours.  ProBNP (last 3 results) No results for input(s): PROBNP in the last 8760 hours.  CBG:  Recent Labs Lab 10/12/15 1136 10/12/15 1709 10/13/15 0742 10/14/15 0817 10/15/15 0757  GLUCAP 77 138* 98 136* 75       Signed:  Rand Etchison MD.  [facility] 10/15/2015, 3:46 PM

## 2015-10-15 NOTE — Progress Notes (Signed)
Physical Therapy Treatment Patient Details Name: Toni Myers MRN: [id number] DOB: Aug 19, 1950 Today's Date: 10/15/2015    History of Present Illness 65 y/o female, never smoker (2nd hand smoke) with hx HTN, CKD, chronic pain, asthma, Sjogren's, RA on methotrexate, scoliosis with restriction on PFT's, uveitis who presented 5/16 with 3 week hx SOB/wheezing     PT Comments    Pt progressing well although continues to have limited activity tolerance; maintains sats >97% on RA today during PT with HR 90s-->117 max(during amb)-->107 after brief rest; will continue to follow;  Follow Up Recommendations  No PT follow up;Supervision - Intermittent     Equipment Recommendations  None recommended by PT    Recommendations for Other Services       Precautions / Restrictions Precautions Precautions: Fall Precaution Comments: watch O2 saturations Restrictions Weight Bearing Restrictions: No    Mobility  Bed Mobility Overal bed mobility: Modified Independent             General bed mobility comments: increased time to perform, no physical assist required, HOB elevated  Transfers Overall transfer level: Needs assistance Equipment used: None Transfers: Sit to/from Stand Sit to Stand: Supervision;Min guard         General transfer comment: min guard  to close supervision for safety and stability  Ambulation/Gait Ambulation/Gait assistance: Min guard Ambulation Distance (Feet): 140 Feet Assistive device: 1 person hand held assist;None Gait Pattern/deviations: Step-through pattern;Decreased stride length;Narrow base of support;Drifts right/left Gait velocity: decreased Gait velocity interpretation: Below normal speed for age/gender General Gait Details: patient with some instability noted during ambulation, ~4 balance checks but able to self correct, min guard for safety. Ambulated on room air with saturations remaining stable 97% throughout, one 45sec standing rest d/t incr  WOB--3/4 DOE   Stairs            Wheelchair Mobility    Modified Rankin (Stroke Patients Only)       Balance                             High level balance activites: Direction changes;Head turns;Turns High Level Balance Comments: min/guard for safety, pt able to self correct    Cognition Arousal/Alertness: Awake/alert Behavior During Therapy: WFL for tasks assessed/performed Overall Cognitive Status: Within Functional Limits for tasks assessed                      Exercises General Exercises - Lower Extremity Hip Flexion/Marching: AROM;Strengthening;Both;10 reps;Standing Heel Raises: AROM;Strengthening;Both;10 reps;Standing    General Comments        Pertinent Vitals/Pain Pain Assessment: No/denies pain    Home Living                      Prior Function            PT Goals (current goals can now be found in the care plan section) Acute Rehab PT Goals Patient Stated Goal: to go home PT Goal Formulation: With patient Time For Goal Achievement: 10/28/15 Potential to Achieve Goals: Good Progress towards PT goals: Progressing toward goals    Frequency  Min 3X/week    PT Plan Current plan remains appropriate    Co-evaluation             End of Session   Activity Tolerance: Patient limited by fatigue Patient left: in bed;with call bell/phone within reach;with bed alarm set     Time:  [id number] PT Time Calculation (min) (ACUTE ONLY): 16 min  Charges:  $Gait Training: 8-22 mins                    G Codes:      Toni Myers October 28, 2015, 10:13 AM

## 2015-10-15 NOTE — Progress Notes (Signed)
Progress Note    Toni Myers  [id number] DOB: 1950/08/28  DOA: 10/06/2015 PCP: Fae Pippin    Brief Narrative:   Toni Myers is an 65 y.o. female with a PMH of asthmatic bronchitis, Sjogren's syndrome, uveitis and hypertension who was admitted 10/06/15 the chief complaint of a 3 week history of persistent shortness of breath/wheezing after failing outpatient treatment including 2 courses of antibiotics as well as a prednisone taper. Upon initial presentation, patient had acute respiratory failure requiring BiPAP. She is currently off oxygen and is on RA with good oxygen saturations. She has been transitioned to po steroids.   Assessment/Plan:   Principal Problem:   Acute respiratory failure with hypoxia (HCC) secondary to asthmatic bronchitis with acute exacerbation Patient was admitted and placed on BiPAP, IV Solu-Medrol, nebulized bronchodilator treatments, nebulized Pulmicort, and empiric Levaquin.  Continue Mucinex. High resolution CT done 10/11/15 and showed findings consistent with an infectious or inflammatory bronchiolitis versus hypersensitivity pneumonitis. Pulmonology consultation performed 10/12/15. She has responded well to IV steroids, plan to transition to oral steroids tomorrow  and a slow taper for the next two weeks. With outpatient follow up with pulmonology. Appointment has already been made. She is currently on RA with good sats at rest and on ambulation.     Active Problems:   Low TSH Free T4 WNL.    Palpitations/chest tightness The patient reported palpitations accompanied by chest tightness overnight 10/10/15 and again 10/13/15. 12-lead EKG showed flipped T waves. Troponins negative 4. Continue aspirin. Resolved after a few minutes. No more chest pain or palpitations today.     Stage II chronic kidney disease Renal function stable. Baseline creatinine 0.5-0.77.    Essential hypertension Patient was continued on her usual home dose of amlodipine.  Blood pressure reasonable.    Rheumatoid arthritis ([place]) On weekly methotrexate. Continue folic acid.    Sjogren's syndrome Continue Restasis.   Family Communication/Anticipated D/C date and plan/Code Status   DVT prophylaxis: Lovenox ordered. Code Status: Full Code.  Family Communication: none at bedside.  Disposition Plan: Home tomorrow,    Medical Consultants:    Pulmonology  Procedures:    2-D echocardiogram  Anti-Infectives:   Levaquin 10/06/15--->10/12/15  Subjective:   Toni Myers  Reports sheis much better. Wants to go home tomorrow , when her daughter comes back from work.    Objective:    Filed Vitals:   10/15/15 0545 10/15/15 0738 10/15/15 0800 10/15/15 1128  BP: 129/83  130/82 120/60  Pulse: 86  88 88  Temp: 98.2 F (36.8 C)  97.8 F (36.6 C) 98.5 F (36.9 C)  TempSrc: Oral  Oral Oral  Resp: 18     Height:      Weight: 61.009 kg (134 lb 8 oz)     SpO2: 98% 97% 99% 100%   No intake or output data in the 24 hours ending 10/15/15 1257 Filed Weights   10/13/15 0500 10/14/15 0400 10/15/15 0545  Weight: 60.419 kg (133 lb 3.2 oz) 61.054 kg (134 lb 9.6 oz) 61.009 kg (134 lb 8 oz)    Exam: General exam: Appears comfortable. Respiratory system: no wheezing heard. Good air entry.  Cardiovascular system: S1 & S2 heard, mildly tacychardic. No JVD,  rubs, gallops or clicks. No murmurs. Gastrointestinal system: Abdomen is nondistended, soft and nontender. No organomegaly or masses felt. Normal bowel sounds heard. Central nervous system: Alert and oriented. No focal neurological deficits. Extremities: No clubbing, edema, or cyanosis. Skin: No rashes,  lesions or ulcers Psychiatry: Judgement and insight appear normal. Mood & affect appropriate.   Data Reviewed:   I have personally reviewed following labs and imaging studies:  Labs: Basic Metabolic Panel:  Recent Labs Lab 10/09/15 0647 10/12/15 0424 10/13/15 0455 10/14/15 0400  NA 140 136  --   135  K 5.2* 4.3  --  4.3  CL 104 101  --  100*  CO2 27 28  --  27  GLUCOSE 125* 131*  --  129*  BUN 16 21*  --  16  CREATININE 0.64 0.66 0.59 0.63  CALCIUM 9.3 8.7*  --  8.4*   GFR Estimated Creatinine Clearance: 57.2 mL/min (by C-G formula based on Cr of 0.63). Liver Function Tests:  Recent Labs Lab 10/14/15 0400  AST 17  ALT 18  ALKPHOS 52  BILITOT 0.8  PROT 5.9*  ALBUMIN 3.1*   CBC:  Recent Labs Lab 10/09/15 0647 10/12/15 0424 10/14/15 0400  WBC 7.0 8.1 8.6  NEUTROABS  --   --  8.2*  HGB 12.1 11.6* 11.2*  HCT 37.4 35.3* 33.9*  MCV 99.2 97.2 95.8  PLT 338 290 255   Cardiac Enzymes:  Recent Labs Lab 10/10/15 0558 10/10/15 1055 10/10/15 1814  TROPONINI <0.03 0.03 <0.03   Thyroid function studies: No results for input(s): TSH, T4TOTAL, T3FREE, THYROIDAB in the last 72 hours.  Invalid input(s): FREET3 Urine analysis:    Component Value Date/Time   PHURINE 4.5 07/18/2012 1306   Microbiology Recent Results (from the past 240 hour(s))  Respiratory Panel by PCR     Status: None   Collection Time: 10/13/15 12:57 PM  Result Value Ref Range Status   Adenovirus NOT DETECTED NOT DETECTED Final   Coronavirus 229E NOT DETECTED NOT DETECTED Final   Coronavirus HKU1 NOT DETECTED NOT DETECTED Final   Coronavirus NL63 NOT DETECTED NOT DETECTED Final   Coronavirus OC43 NOT DETECTED NOT DETECTED Final   Metapneumovirus NOT DETECTED NOT DETECTED Final   Rhinovirus / Enterovirus NOT DETECTED NOT DETECTED Final   Influenza A NOT DETECTED NOT DETECTED Final   Influenza A H1 NOT DETECTED NOT DETECTED Final   Influenza A H1 2009 NOT DETECTED NOT DETECTED Final   Influenza A H3 NOT DETECTED NOT DETECTED Final   Influenza B NOT DETECTED NOT DETECTED Final   Parainfluenza Virus 1 NOT DETECTED NOT DETECTED Final   Parainfluenza Virus 2 NOT DETECTED NOT DETECTED Final   Parainfluenza Virus 3 NOT DETECTED NOT DETECTED Final   Parainfluenza Virus 4 NOT DETECTED NOT DETECTED  Final   Respiratory Syncytial Virus NOT DETECTED NOT DETECTED Final   Bordetella pertussis NOT DETECTED NOT DETECTED Final   Chlamydophila pneumoniae NOT DETECTED NOT DETECTED Final   Mycoplasma pneumoniae NOT DETECTED NOT DETECTED Final    Radiology: Dg Chest 2 View  10/14/2015  CLINICAL DATA:  History of asthmatic bronchitis, COPD, rheumatoid arthritis EXAM: CHEST  2 VIEW COMPARISON:  PA and lateral chest x-ray of Oct 06, 2015 FINDINGS: The lungs are mildly hyperinflated there is stable mild blunting of the left lateral costophrenic angle. The heart is normal in size. The pulmonary vascularity is not engorged. There is severe dextrocurvature of the mid thoracic spine. IMPRESSION: Hyperinflation consistent with COPD -reactive airway disease. There is no pneumonia nor CHF. Electronically Signed   By: David  Martinique M.D.   On: 10/14/2015 07:27    Medications:   . albuterol  2.5 mg Nebulization BID  . amLODipine  5 mg Oral q1800  .  aspirin  325 mg Oral Daily  . budesonide (PULMICORT) nebulizer solution  0.25 mg Nebulization BID  . calcium citrate  200 mg of elemental calcium Oral Daily  . chlorpheniramine-HYDROcodone  5 mL Oral Q12H  . cycloSPORINE  2 drop Both Eyes Daily  . docusate sodium  100 mg Oral BID  . escitalopram  5 mg Oral Daily  . folic acid  2 mg Oral Daily  . guaiFENesin  1,200 mg Oral BID  . methotrexate  15 mg Subcutaneous Weekly  . pantoprazole  40 mg Oral Daily  . [START ON 10/16/2015] predniSONE  40 mg Oral QAC breakfast   Continuous Infusions:   Time spent: 25 minutes.   LOS: 9 days   [facility] Pager [phone].  *Please refer to [email or web address], password [email or web address] to get updated schedule on who will round on this patient, as hospitalists switch teams weekly. If 7PM-7AM, please contact night-coverage at [email or web address], password [email or web address] for any overnight needs.  10/15/2015, 12:57 PM

## 2015-10-16 ENCOUNTER — Telehealth: Payer: Self-pay | Admitting: Emergency Medicine

## 2015-10-16 NOTE — Telephone Encounter (Signed)
lmtcb x1 for pt. 

## 2015-10-16 NOTE — Telephone Encounter (Signed)
Pt was d/c'd from hospital last night. She had questions about the OTC medications she was advised to continue taking. These were reviewed with pt and she voiced understanding. She will contact her pharmacy to get the OTC medications. Nothing further needed.

## 2015-10-20 NOTE — Telephone Encounter (Signed)
Left message for patient to call back  

## 2015-10-20 NOTE — Telephone Encounter (Signed)
[phone] calling back she has appt thurs afternoon with tp

## 2015-10-20 NOTE — Telephone Encounter (Signed)
LM for pt at number listed below

## 2015-10-21 NOTE — Telephone Encounter (Signed)
ATC, line busy, wcb

## 2015-10-22 ENCOUNTER — Other Ambulatory Visit: Payer: Self-pay | Admitting: Physician Assistant

## 2015-10-22 ENCOUNTER — Encounter: Payer: Self-pay | Admitting: Adult Health

## 2015-10-22 ENCOUNTER — Ambulatory Visit (INDEPENDENT_AMBULATORY_CARE_PROVIDER_SITE_OTHER): Payer: PPO | Admitting: Adult Health

## 2015-10-22 VITALS — BP 122/70 | HR 107 | Temp 97.4°F | Ht 64.0 in | Wt 133.0 lb

## 2015-10-22 DIAGNOSIS — J4541 Moderate persistent asthma with (acute) exacerbation: Secondary | ICD-10-CM

## 2015-10-22 DIAGNOSIS — Z6824 Body mass index (BMI) 24.0-24.9, adult: Secondary | ICD-10-CM | POA: Diagnosis not present

## 2015-10-22 DIAGNOSIS — E875 Hyperkalemia: Secondary | ICD-10-CM | POA: Diagnosis not present

## 2015-10-22 DIAGNOSIS — Z1231 Encounter for screening mammogram for malignant neoplasm of breast: Secondary | ICD-10-CM

## 2015-10-22 DIAGNOSIS — Z79899 Other long term (current) drug therapy: Secondary | ICD-10-CM | POA: Diagnosis not present

## 2015-10-22 DIAGNOSIS — J984 Other disorders of lung: Secondary | ICD-10-CM | POA: Diagnosis not present

## 2015-10-22 DIAGNOSIS — I10 Essential (primary) hypertension: Secondary | ICD-10-CM | POA: Diagnosis not present

## 2015-10-22 NOTE — Assessment & Plan Note (Signed)
Recent severe exacerbation associated acute hypoxic respiratory failure. Patient is much improved after IV antibiotics, steroids and nebulized bronchodilators. CT showed a similar pattern from 2016 with some micro-nodularity in both lungs. Upper lobe predominant Continue to monitor closely as patient is on methotrexate chronically for uveitis  Plan  Taper prednisone as directed.  Continue on Symbicort 2 puffs Twice daily  , rinse after use.  Keep follow up with Rheumatology and Opthalmology .  Follow up with Dr. Lamonte Sakai  In 6-8 weeks and As needed   Please contact office for sooner follow up if symptoms do not improve or worsen or seek emergency care

## 2015-10-22 NOTE — Telephone Encounter (Signed)
Pt has OV with TP today, this will be discussed in visit.

## 2015-10-22 NOTE — Patient Instructions (Addendum)
Taper prednisone as directed.  Continue on Symbicort 2 puffs Twice daily  , rinse after use.  Keep follow up with Rheumatology and Opthalmology .  Follow up with Dr. Lamonte Sakai  In 6-8 weeks and As needed   Please contact office for sooner follow up if symptoms do not improve or worsen or seek emergency care

## 2015-10-22 NOTE — Progress Notes (Signed)
Subjective:    Patient ID: Toni Myers, female    DOB: Nov 14, 1950, 65 y.o.   MRN: [id number]  HPI 65 yo female never smoker with Sjogren's disease, , scoliosis w/ restriction on PFT , uveitis on methotrexate, chronic cough .   10/01/15 [facility]  Follow up :  Cough  Patient returns for a post follow-up visit.  Patient was recently admitted for asthmatic bronchitic exacerbation with acute hypoxic respiratory failure. Patient required initial BiPAP support. She was treated with aggressive IV antibiotics, steroids and nebulized bronchodilators. CT chest showed a similar pattern from 2016 with faint micronodularity in both lungs.  2-D echo showed normal ejection fracture with grade 1 diastolic dysfunction..  Since discharge she is feeling much improved. She has a few days left on her prednisone. She denies any chest pain, orthopnea, PND, or increased leg swelling. She remains on Symbicort twice daily.    Past Medical History  Diagnosis Date  . Hypertension   . Cough   . Depression   . RLS (restless legs syndrome)   . Chronic low back pain   . Migraine   . Palpitations   . Sjogrens syndrome ([place])   . Anxiety   . Shingles   . Arthritis     Rheumatoid  . Asthma   . Chronic iritis, left eye   . GERD (gastroesophageal reflux disease)   . COPD (chronic obstructive pulmonary disease) ([place])     no o2, Dr.Robert Byrum   . Dementia without behavioral disturbance   . Shortness of breath dyspnea   . Heart murmur   . Closed fracture of multiple pubic rami ([place]) 07/19/2013  . Closed fracture of pelvic rim ([place]) 07/19/2013  . CKD (chronic kidney disease), stage II 10/08/2015   Current Outpatient Prescriptions on File Prior to Visit  Medication Sig Dispense Refill  . albuterol (PROAIR HFA) 108 (90 BASE) MCG/ACT inhaler Inhale 2 puffs into the lungs every 6 (six) hours as needed for wheezing or shortness of breath.     Marland Kitchen amLODipine (NORVASC) 5 MG tablet Take 1 tablet by mouth daily.    .  budesonide-formoterol (SYMBICORT) 160-4.5 MCG/ACT inhaler Inhale 2 puffs into the lungs 2 (two) times daily as needed (SOB).     . calcium citrate (CALCITRATE - DOSED IN MG ELEMENTAL CALCIUM) 950 MG tablet Take 200 mg of elemental calcium by mouth daily.    . cyclobenzaprine (FLEXERIL) 10 MG tablet Take 1 tablet (10 mg total) by mouth at bedtime as needed. For spasms 20 tablet 0  . escitalopram (LEXAPRO) 5 MG tablet Take 5 mg by mouth daily.    Marland Kitchen esomeprazole (NEXIUM 24HR) 20 MG capsule Take 20 mg by mouth daily at 12 noon.    Marland Kitchen FOLIC ACID PO Take 2 tablets by mouth daily.     Marland Kitchen guaiFENesin (ROBITUSSIN) 100 MG/5ML SOLN Take 5 mLs (100 mg total) by mouth every 4 (four) hours as needed for cough or to loosen phlegm. 1200 mL 0  . methotrexate 250 MG/10ML injection Inject 15 mg into the vein once. Injects 0.6 ml subcutaneously every Wed (in stomach).    . predniSONE (DELTASONE) 20 MG tablet Prednisone 40 mg daily for 1 days followed by Prednisone 30 mg daily for 3 days followed by Prednisone 20 mg daily for 3 days followed by Prednisone 10 mg daily for 3 days and stop. 12 tablet 0  . PRESCRIPTION MEDICATION Place 1 drop into both eyes 4 (four) times daily. Eye drop made from  pt's own blood    . RESTASIS 0.05 % ophthalmic emulsion Place 2 drops into both eyes daily.    Marland Kitchen albuterol (PROVENTIL) (2.5 MG/3ML) 0.083% nebulizer solution Take 2.5 mg by nebulization every 4 (four) hours as needed for wheezing or shortness of breath. Reported on 10/22/2015     No current facility-administered medications on file prior to visit.       Review of Systems    .Constitutional:   No  weight loss, night sweats,  Fevers, chills,+ fatigue, or  lassitude.  HEENT:   No headaches,  Difficulty swallowing,  Tooth/dental problems, or  Sore throat,                No sneezing, itching, ear ache,  +nasal congestion, post nasal drip,   CV:  No chest pain,  Orthopnea, PND, swelling in lower extremities, anasarca, dizziness,  palpitations, syncope.   GI  No heartburn, indigestion, abdominal pain, nausea, vomiting, diarrhea, change in bowel habits, loss of appetite, bloody stools.   Resp:   No chest wall deformity  Skin: no rash or lesions.  GU: no dysuria, change in color of urine, no urgency or frequency.  No flank pain, no hematuria   MS:  No joint pain or swelling.  No decreased range of motion.  No back pain.  Psych:  No change in mood or affect. No depression or anxiety.  No memory loss.       Objective:   Physical Exam Filed Vitals:   10/22/15 1442  BP: 122/70  Pulse: 107  Temp: 97.4 F (36.3 C)  TempSrc: Oral  Height: 5\' 4"  (1.626 m)  Weight: 133 lb (60.328 kg)  SpO2: 97%   GEN: A/Ox3; pleasant , NAD   HEENT:  [place]/AT,  EACs-clear, TMs-wnl, NOSE-clear, THROAT-clear, no lesions, no postnasal drip or exudate noted.   NECK:  Supple w/ fair ROM; no JVD; normal carotid impulses w/o bruits; no thyromegaly or nodules palpated; no lymphadenopathy.no stridor   RESP CTA w /no wheezing  no accessory muscle use, no dullness to percussion  CARD:  RRR, no m/r/g  , no peripheral edema, pulses intact, no cyanosis or clubbing.  GI:   Soft & nt; nml bowel sounds; no organomegaly or masses detected.  Musco: Warm bil, no deformities or joint swelling noted.   Neuro: alert, no focal deficits noted.    Skin: Warm, no lesions or rashes  Tammy Parrett NP-C  [facility]  10/01/15

## 2015-10-26 ENCOUNTER — Telehealth: Payer: Self-pay | Admitting: Emergency Medicine

## 2015-10-26 NOTE — Telephone Encounter (Signed)
LM for pt

## 2015-10-26 NOTE — Telephone Encounter (Signed)
Patient returning call-prm  °

## 2015-10-27 DIAGNOSIS — Z23 Encounter for immunization: Secondary | ICD-10-CM | POA: Diagnosis not present

## 2015-10-27 NOTE — Telephone Encounter (Signed)
[phone] pt returned call

## 2015-10-27 NOTE — Telephone Encounter (Signed)
Pt cb states she is sched to get DTAP at 10am this morning please advise [phone]

## 2015-10-27 NOTE — Telephone Encounter (Signed)
Attempted to contact pt. Line was busy. Will try back. 

## 2015-10-28 NOTE — Telephone Encounter (Signed)
Spoke with the pt  She states that she got her TDAP vaccine yesterday b/c PCP felt it was appropriate despite recent hospitalization  Immunizations updated Nothing further needed

## 2015-11-03 ENCOUNTER — Ambulatory Visit
Admission: RE | Admit: 2015-11-03 | Discharge: 2015-11-03 | Disposition: A | Discharge status: Home / Self Care | Payer: PPO | Source: Ambulatory Visit | Attending: Physician Assistant | Admitting: Physician Assistant

## 2015-11-03 DIAGNOSIS — Z1231 Encounter for screening mammogram for malignant neoplasm of breast: Secondary | ICD-10-CM

## 2015-12-17 ENCOUNTER — Encounter: Payer: Self-pay | Admitting: Emergency Medicine

## 2015-12-17 ENCOUNTER — Ambulatory Visit (INDEPENDENT_AMBULATORY_CARE_PROVIDER_SITE_OTHER): Payer: PPO | Admitting: Emergency Medicine

## 2015-12-17 VITALS — BP 130/86 | HR 97 | Ht 64.0 in | Wt 140.0 lb

## 2015-12-17 DIAGNOSIS — J4521 Mild intermittent asthma with (acute) exacerbation: Secondary | ICD-10-CM

## 2015-12-17 DIAGNOSIS — J383 Other diseases of vocal cords: Secondary | ICD-10-CM

## 2015-12-17 DIAGNOSIS — J189 Pneumonia, unspecified organism: Secondary | ICD-10-CM

## 2015-12-17 NOTE — Assessment & Plan Note (Signed)
Continue Symbicort and albuterol when necessary. Unclear to me whether her cough is related to her asthma as it has characteristics consistent with upper airway

## 2015-12-17 NOTE — Assessment & Plan Note (Signed)
Upper airway noise today. I suspect that her cough is largely related. She denies any GERD or rhinitis symptoms but we may need to empirically treat these as we go forward.

## 2015-12-17 NOTE — Patient Instructions (Addendum)
We will repeat your CT chest with high res cuts in mid August 2017.  Depending on the results we decide whether to perform a bronchoscopy for cultures.  Please continue your Symbicort as you are taking it.  Take albuterol 2 puffs up to every 4 hours if needed for shortness of breath.  Follow with Dr Lamonte Sakai in August after the scan to review the results.

## 2015-12-17 NOTE — Assessment & Plan Note (Signed)
Based on CT scan from May 2017 that showed very subtle but real micronodular disease. Unclear to me whether this could be related to Sjogren's, possibly even to opportunistic infection. I believe she needs a repeat CT scan to see if this has resolved since it was observed while she was acutely ill and hospitalized. If the changes persist and I think she needs bronchoscopy with BAL for culture data to rule out opportunistic infection.

## 2015-12-17 NOTE — Progress Notes (Signed)
Subjective:    Patient ID: Toni Myers, female    DOB: 18-Nov-1950, 65y.o.   MRN: [id number] HPI 65 yo woman, never smoker, hx of Sjogren's, HTN, depression. Has a hx of recurrent cough usually worst in Fall/Spring. She was well until about 5 months ago, then after URI about 5 months ago she has had persistent cough, rarely productive. No real nasal gtt or congestion. She was started on Symbicort 67yrs ago, only using prn, then was started on Spiriva more recently - has only used a few times. Has used ProAir prn.   10/02/14 -- follow-up for history of Sjogren's disease, hypertension, depression. She is being treated for uveitis with methotrexate. Based on her continued cough and dyspnea a high-resolution CT scan of the chest was obtained on 09/15/14. I reviewed this study personally and it shows no evidence of interstitial lung disease associated with either Sjogren's or methotrexate. She  underwent pulmonary function testing on 5/12 that shows mixed obstruction and restriction with an FEV1 of 1.14 L or 49% of predicted. She had pseudonormalization of her lung volumes and decreased effusion capacity corrected for her alveolar volume. Her breathing difficulty. She continues to have some trigger-related dyspnea, not always responsive to albuterol.   ROV 12/17/15 -- Patient has a history of Sjogren's disease has a history of Sjogren's disease with associated uveitis on methotrexate, hypertension, depression. She also has a history of mixed restriction and obstruction on pulmonary function testing with an FEV1 of 1.14 L. Prior CT scan of the chest has not shown any evidence of interstitial disease. She was last seen in our office by T Parrett, 10/01/15 At which time she was felt to have an acute asthmatic exacerbation and was treated with steroids and antibiotics. She was subsequently admitted to the hospital 10/06/15 4 acute on chronic hypoxemic respiratory failure requiring BiPAP, IV steroids, bronchodilators. A CT scan of the chest performed during  that hospitalization on 10/11/15 was personally reviewed by me today. There was some very mild faint patchy centrilobular micronodular in both lungs in the upper lobe predominant distribution consistent with possible acute pneumonitis.  She has had more cough, wheeze with more flares over the last 2 years. She is on Symbicort. Denies GERD or rhinitis.    Exam: Vitals:   12/17/15 1040  BP: 130/86  Pulse: 97  SpO2: 99%  Weight: 140 lb (63.5 kg)  Height: 5\' 4"  (1.626 m)   Gen: Pleasant, well-nourished, in no distress,  normal affect, dry cough , frequent throat clearing   ENT: No lesions,  mouth clear,  oropharynx clear, no postnasal drip  Neck: No JVD, no TMG, no carotid bruits  Lungs: No use of accessory muscles, no dullness to percussion, clear without rales or rhonchi  Cardiovascular: RRR, heart sounds normal, no murmur or gallops, no peripheral edema  Musculoskeletal: No deformities, no cyanosis or clubbing  Neuro: alert, non focal  Skin: Warm, no lesions or rashes   CT scan 10/09/11 --  Comparison: Chest radiograph 10/07/2011  Findings: There is rotatory scoliosis noted.  No axillary or supraclavicular lymphadenopathy. No mediastinal or  hilar lymphadenopathy. No pericardial fluid. Esophagus is normal.  Review of the lung parenchyma demonstrates some small foci of  peripheral nodularity within the left lower lobe and lingula which  is felt represent atelectasis. No clear pneumonia is evident.  There is no pleural fluid.  Limited view of the upper abdomen is unremarkable.  Limited view of the skeleton demonstrates no aggressive osseous  lesions.  IMPRESSION:  1. Several foci of atelectasis  within the left lower lobe and  lingula are likely related to scoliosis. Small foci of infection  are felt less likely.  2. Sigmoid rotatory scoliosis  CXR 08/04/2014  Report/ NAD      Assessment & Plan:  Asthma Continue Symbicort and albuterol when necessary. Unclear to me  whether her cough is related to her asthma as it has characteristics consistent with upper airway  Vocal cord dysfunction Upper airway noise today. I suspect that her cough is largely related. She denies any GERD or rhinitis symptoms but we may need to empirically treat these as we go forward.  Pneumonitis Based on CT scan from May 2017 that showed very subtle but real micronodular disease. Unclear to me whether this could be related to Sjogren's, possibly even to opportunistic infection. I believe she needs a repeat CT scan to see if this has resolved since it was observed while she was acutely ill and hospitalized. If the changes persist and I think she needs bronchoscopy with BAL for culture data to rule out opportunistic infection.    Baltazar Apo, MD, PhD 12/17/2015, 11:25 AM [facility] [phone] or if no answer [phone]

## 2016-01-04 DIAGNOSIS — Z6824 Body mass index (BMI) 24.0-24.9, adult: Secondary | ICD-10-CM | POA: Diagnosis not present

## 2016-01-04 DIAGNOSIS — L03115 Cellulitis of right lower limb: Secondary | ICD-10-CM | POA: Diagnosis not present

## 2016-01-06 ENCOUNTER — Ambulatory Visit (INDEPENDENT_AMBULATORY_CARE_PROVIDER_SITE_OTHER)
Admission: RE | Admit: 2016-01-06 | Discharge: 2016-01-06 | Disposition: A | Discharge status: Home / Self Care | Payer: PPO | Source: Ambulatory Visit | Attending: Emergency Medicine | Admitting: Emergency Medicine

## 2016-01-06 DIAGNOSIS — J189 Pneumonia, unspecified organism: Secondary | ICD-10-CM | POA: Diagnosis not present

## 2016-01-06 DIAGNOSIS — R05 Cough: Secondary | ICD-10-CM | POA: Diagnosis not present

## 2016-01-07 DIAGNOSIS — M7130 Other bursal cyst, unspecified site: Secondary | ICD-10-CM | POA: Diagnosis not present

## 2016-01-07 DIAGNOSIS — L738 Other specified follicular disorders: Secondary | ICD-10-CM | POA: Diagnosis not present

## 2016-01-07 DIAGNOSIS — W57XXXA Bitten or stung by nonvenomous insect and other nonvenomous arthropods, initial encounter: Secondary | ICD-10-CM | POA: Diagnosis not present

## 2016-01-07 DIAGNOSIS — L02415 Cutaneous abscess of right lower limb: Secondary | ICD-10-CM | POA: Diagnosis not present

## 2016-01-11 DIAGNOSIS — M419 Scoliosis, unspecified: Secondary | ICD-10-CM | POA: Diagnosis not present

## 2016-01-11 DIAGNOSIS — M810 Age-related osteoporosis without current pathological fracture: Secondary | ICD-10-CM | POA: Diagnosis not present

## 2016-01-19 ENCOUNTER — Ambulatory Visit (INDEPENDENT_AMBULATORY_CARE_PROVIDER_SITE_OTHER): Payer: PPO | Admitting: Emergency Medicine

## 2016-01-19 ENCOUNTER — Encounter: Payer: Self-pay | Admitting: Emergency Medicine

## 2016-01-19 DIAGNOSIS — J4521 Mild intermittent asthma with (acute) exacerbation: Secondary | ICD-10-CM | POA: Diagnosis not present

## 2016-01-19 DIAGNOSIS — J189 Pneumonia, unspecified organism: Secondary | ICD-10-CM

## 2016-01-19 NOTE — Progress Notes (Signed)
Subjective:    Patient ID: Toni Myers, female    DOB: Nov 15, 1950, 65y.o.   MRN: [id number] HPI 65 yo woman, never smoker, hx of Sjogren's, HTN, depression. Has a hx of recurrent cough usually worst in Fall/Spring. She was well until about 5 months ago, then after URI about 5 months ago she has had persistent cough, rarely productive. No real nasal gtt or congestion. She was started on Symbicort 74yrs ago, only using prn, then was started on Spiriva more recently - has only used a few times. Has used ProAir prn.   10/02/14 -- follow-up for history of Sjogren's disease, hypertension, depression. She is being treated for uveitis with methotrexate. Based on her continued cough and dyspnea a high-resolution CT scan of the chest was obtained on 09/15/14. I reviewed this study personally and it shows no evidence of interstitial lung disease associated with either Sjogren's or methotrexate. She  underwent pulmonary function testing on 5/12 that shows mixed obstruction and restriction with an FEV1 of 1.14 L or 49% of predicted. She had pseudonormalization of her lung volumes and decreased effusion capacity corrected for her alveolar volume. Her breathing difficulty. She continues to have some trigger-related dyspnea, not always responsive to albuterol.   ROV 12/17/15 -- Patient has a history of Sjogren's disease with associated uveitis on methotrexate, hypertension, depression. She also has a history of mixed restriction and obstruction on pulmonary function testing with an FEV1 of 1.14 L. Prior CT scan of the chest has not shown any evidence of interstitial disease. She was last seen in our office by T Parrett, 10/01/15 At which time she was felt to have an acute asthmatic exacerbation and was treated with steroids and antibiotics. She was subsequently admitted to the hospital 10/06/15 4 acute on chronic hypoxemic respiratory failure requiring BiPAP, IV steroids, bronchodilators. A CT scan of the chest performed during  that hospitalization on 10/11/15 was personally reviewed by me today. There was some very mild faint patchy centrilobular micronodular in both lungs in the upper lobe predominant distribution consistent with possible acute pneumonitis.  She has had more cough, wheeze with more flares over the last 2 years. She is on Symbicort. Denies GERD or rhinitis.   ROV 01/19/16 -- Physical visit for patient with a history of mixed obstructive and restrictive disease in the setting of Sjogren's uveitis on methotrexate, hypertension, depression. At our last visit we were evaluating changes consistent with a pneumonitis versus opportunistic infection that occurred in May while she was hospitalized. We repeated her CT scan of the chest 01/06/16 and I personally reviewed. This shows no acute change but some residual stable Very subtle centrilobular groundglass in the bilateral upper lung zones. She still has some wheeze and cough. She uses her symbicort prn, about 2x a day, not sure how much it helps.    Exam: Vitals:   01/19/16 0907  BP: 120/84  Pulse: (!) 104  SpO2: 96%  Weight: 140 lb (63.5 kg)  Height: 5\' 4"  (1.626 m)   Gen: Pleasant, well-nourished, in no distress,  normal affect, dry cough , frequent throat clearing   ENT: No lesions,  mouth clear,  oropharynx clear, no postnasal drip  Neck: No JVD, no TMG, no carotid bruits  Lungs: No use of accessory muscles, no dullness to percussion, clear without rales or rhonchi  Cardiovascular: RRR, heart sounds normal, no murmur or gallops, no peripheral edema  Musculoskeletal: No deformities, no cyanosis or clubbing  Neuro: alert, non focal  Skin: Warm, no  lesions or rashes   CT scan 10/09/11 --  Comparison: Chest radiograph 10/07/2011  Findings: There is rotatory scoliosis noted.  No axillary or supraclavicular lymphadenopathy. No mediastinal or  hilar lymphadenopathy. No pericardial fluid. Esophagus is normal.  Review of the lung parenchyma  demonstrates some small foci of  peripheral nodularity within the left lower lobe and lingula which  is felt represent atelectasis. No clear pneumonia is evident.  There is no pleural fluid.  Limited view of the upper abdomen is unremarkable.  Limited view of the skeleton demonstrates no aggressive osseous  lesions.  IMPRESSION:  1. Several foci of atelectasis within the left lower lobe and  lingula are likely related to scoliosis. Small foci of infection  are felt less likely.  2. Sigmoid rotatory scoliosis  CXR 08/04/2014  Report/ NAD      Assessment & Plan:  Pneumonitis Very subtle apical groundglass changes that I do not believe merit bronchoscopy at this time. I discussed this with her today. We will likely need to reimage periodically. We will certainly do so if she changes clinically.  Asthma Stop Symbicort, which she is using when necessary Use albuterol as needed    Baltazar Apo, MD, PhD 01/19/2016, 9:41 AM [facility] [phone] or if no answer [phone]

## 2016-01-19 NOTE — Assessment & Plan Note (Signed)
Stop Symbicort, which she is using when necessary Use albuterol as needed

## 2016-01-19 NOTE — Assessment & Plan Note (Signed)
Very subtle apical groundglass changes that I do not believe merit bronchoscopy at this time. I discussed this with her today. We will likely need to reimage periodically. We will certainly do so if she changes clinically.

## 2016-01-19 NOTE — Patient Instructions (Addendum)
Please stop symbicort altogether.  Take albuterol 2 puffs up to every 4 hours if needed for shortness of breath.  Your CT scan of the chest was stable. No indication to perform bronchoscopy at this time.  Continue nexium  Get the flu shot this fall.  Follow with Dr Lamonte Sakai in 6 months or sooner if you have any problems

## 2016-01-22 DIAGNOSIS — J449 Chronic obstructive pulmonary disease, unspecified: Secondary | ICD-10-CM | POA: Diagnosis not present

## 2016-01-22 DIAGNOSIS — J984 Other disorders of lung: Secondary | ICD-10-CM | POA: Diagnosis not present

## 2016-01-22 DIAGNOSIS — Z6825 Body mass index (BMI) 25.0-25.9, adult: Secondary | ICD-10-CM | POA: Diagnosis not present

## 2016-01-22 DIAGNOSIS — Z9181 History of falling: Secondary | ICD-10-CM | POA: Diagnosis not present

## 2016-01-22 DIAGNOSIS — Z79899 Other long term (current) drug therapy: Secondary | ICD-10-CM | POA: Diagnosis not present

## 2016-01-22 DIAGNOSIS — F329 Major depressive disorder, single episode, unspecified: Secondary | ICD-10-CM | POA: Diagnosis not present

## 2016-01-22 DIAGNOSIS — I10 Essential (primary) hypertension: Secondary | ICD-10-CM | POA: Diagnosis not present

## 2016-01-22 DIAGNOSIS — M3500 Sicca syndrome, unspecified: Secondary | ICD-10-CM | POA: Diagnosis not present

## 2016-02-03 DIAGNOSIS — M810 Age-related osteoporosis without current pathological fracture: Secondary | ICD-10-CM | POA: Diagnosis not present

## 2016-02-11 DIAGNOSIS — M150 Primary generalized (osteo)arthritis: Secondary | ICD-10-CM | POA: Diagnosis not present

## 2016-02-11 DIAGNOSIS — Z79899 Other long term (current) drug therapy: Secondary | ICD-10-CM | POA: Diagnosis not present

## 2016-02-11 DIAGNOSIS — H3093 Unspecified chorioretinal inflammation, bilateral: Secondary | ICD-10-CM | POA: Diagnosis not present

## 2016-02-11 DIAGNOSIS — M3501 Sicca syndrome with keratoconjunctivitis: Secondary | ICD-10-CM | POA: Diagnosis not present

## 2016-02-11 DIAGNOSIS — Z23 Encounter for immunization: Secondary | ICD-10-CM | POA: Diagnosis not present

## 2016-02-11 DIAGNOSIS — R05 Cough: Secondary | ICD-10-CM | POA: Diagnosis not present

## 2016-03-21 DIAGNOSIS — R0789 Other chest pain: Secondary | ICD-10-CM | POA: Diagnosis not present

## 2016-03-21 DIAGNOSIS — F419 Anxiety disorder, unspecified: Secondary | ICD-10-CM | POA: Diagnosis not present

## 2016-04-04 DIAGNOSIS — H52209 Unspecified astigmatism, unspecified eye: Secondary | ICD-10-CM | POA: Diagnosis not present

## 2016-04-04 DIAGNOSIS — H16143 Punctate keratitis, bilateral: Secondary | ICD-10-CM | POA: Diagnosis not present

## 2016-04-04 DIAGNOSIS — H5210 Myopia, unspecified eye: Secondary | ICD-10-CM | POA: Diagnosis not present

## 2016-04-04 DIAGNOSIS — Z888 Allergy status to other drugs, medicaments and biological substances status: Secondary | ICD-10-CM | POA: Diagnosis not present

## 2016-04-04 DIAGNOSIS — H53001 Unspecified amblyopia, right eye: Secondary | ICD-10-CM | POA: Diagnosis not present

## 2016-04-04 DIAGNOSIS — H209 Unspecified iridocyclitis: Secondary | ICD-10-CM | POA: Diagnosis not present

## 2016-04-04 DIAGNOSIS — I10 Essential (primary) hypertension: Secondary | ICD-10-CM | POA: Diagnosis not present

## 2016-04-04 DIAGNOSIS — Z885 Allergy status to narcotic agent status: Secondary | ICD-10-CM | POA: Diagnosis not present

## 2016-04-04 DIAGNOSIS — H2513 Age-related nuclear cataract, bilateral: Secondary | ICD-10-CM | POA: Diagnosis not present

## 2016-04-04 DIAGNOSIS — H04123 Dry eye syndrome of bilateral lacrimal glands: Secondary | ICD-10-CM | POA: Diagnosis not present

## 2016-04-04 DIAGNOSIS — M3500 Sicca syndrome, unspecified: Secondary | ICD-10-CM | POA: Diagnosis not present

## 2016-04-04 DIAGNOSIS — H43811 Vitreous degeneration, right eye: Secondary | ICD-10-CM | POA: Diagnosis not present

## 2016-04-04 DIAGNOSIS — H0289 Other specified disorders of eyelid: Secondary | ICD-10-CM | POA: Diagnosis not present

## 2016-04-04 DIAGNOSIS — J449 Chronic obstructive pulmonary disease, unspecified: Secondary | ICD-10-CM | POA: Diagnosis not present

## 2016-04-04 DIAGNOSIS — H01009 Unspecified blepharitis unspecified eye, unspecified eyelid: Secondary | ICD-10-CM | POA: Diagnosis not present

## 2016-04-04 DIAGNOSIS — Z79899 Other long term (current) drug therapy: Secondary | ICD-10-CM | POA: Diagnosis not present

## 2016-04-12 ENCOUNTER — Other Ambulatory Visit: Payer: Self-pay | Admitting: Neurosurgery

## 2016-04-12 ENCOUNTER — Ambulatory Visit
Admission: RE | Admit: 2016-04-12 | Discharge: 2016-04-12 | Disposition: A | Discharge status: Home / Self Care | Payer: PPO | Source: Ambulatory Visit | Attending: Neurosurgery | Admitting: Neurosurgery

## 2016-04-12 DIAGNOSIS — M419 Scoliosis, unspecified: Secondary | ICD-10-CM

## 2016-04-12 DIAGNOSIS — M4185 Other forms of scoliosis, thoracolumbar region: Secondary | ICD-10-CM | POA: Diagnosis not present

## 2016-04-21 DIAGNOSIS — L308 Other specified dermatitis: Secondary | ICD-10-CM | POA: Diagnosis not present

## 2016-04-21 DIAGNOSIS — L309 Dermatitis, unspecified: Secondary | ICD-10-CM | POA: Diagnosis not present

## 2016-04-21 DIAGNOSIS — L930 Discoid lupus erythematosus: Secondary | ICD-10-CM | POA: Diagnosis not present

## 2016-04-28 DIAGNOSIS — L930 Discoid lupus erythematosus: Secondary | ICD-10-CM | POA: Diagnosis not present

## 2016-04-28 DIAGNOSIS — Z79899 Other long term (current) drug therapy: Secondary | ICD-10-CM | POA: Diagnosis not present

## 2016-05-04 DIAGNOSIS — J029 Acute pharyngitis, unspecified: Secondary | ICD-10-CM | POA: Diagnosis not present

## 2016-05-04 DIAGNOSIS — Z6824 Body mass index (BMI) 24.0-24.9, adult: Secondary | ICD-10-CM | POA: Diagnosis not present

## 2016-05-19 DIAGNOSIS — Z6824 Body mass index (BMI) 24.0-24.9, adult: Secondary | ICD-10-CM | POA: Diagnosis not present

## 2016-05-19 DIAGNOSIS — J189 Pneumonia, unspecified organism: Secondary | ICD-10-CM | POA: Diagnosis not present

## 2016-06-02 ENCOUNTER — Ambulatory Visit: Payer: PPO | Admitting: Adult Health

## 2016-07-21 DIAGNOSIS — J449 Chronic obstructive pulmonary disease, unspecified: Secondary | ICD-10-CM | POA: Diagnosis not present

## 2016-07-21 DIAGNOSIS — M3500 Sicca syndrome, unspecified: Secondary | ICD-10-CM | POA: Diagnosis not present

## 2016-07-21 DIAGNOSIS — Z79899 Other long term (current) drug therapy: Secondary | ICD-10-CM | POA: Diagnosis not present

## 2016-07-21 DIAGNOSIS — I10 Essential (primary) hypertension: Secondary | ICD-10-CM | POA: Diagnosis not present

## 2016-07-21 DIAGNOSIS — F418 Other specified anxiety disorders: Secondary | ICD-10-CM | POA: Diagnosis not present

## 2016-07-21 DIAGNOSIS — Z23 Encounter for immunization: Secondary | ICD-10-CM | POA: Diagnosis not present

## 2016-08-11 DIAGNOSIS — M47816 Spondylosis without myelopathy or radiculopathy, lumbar region: Secondary | ICD-10-CM | POA: Diagnosis not present

## 2016-08-11 DIAGNOSIS — M150 Primary generalized (osteo)arthritis: Secondary | ICD-10-CM | POA: Diagnosis not present

## 2016-08-11 DIAGNOSIS — M19041 Primary osteoarthritis, right hand: Secondary | ICD-10-CM | POA: Diagnosis not present

## 2016-08-11 DIAGNOSIS — M064 Inflammatory polyarthropathy: Secondary | ICD-10-CM | POA: Diagnosis not present

## 2016-08-11 DIAGNOSIS — Z79899 Other long term (current) drug therapy: Secondary | ICD-10-CM | POA: Diagnosis not present

## 2016-08-11 DIAGNOSIS — M19072 Primary osteoarthritis, left ankle and foot: Secondary | ICD-10-CM | POA: Diagnosis not present

## 2016-08-11 DIAGNOSIS — M3500 Sicca syndrome, unspecified: Secondary | ICD-10-CM | POA: Diagnosis not present

## 2016-08-11 DIAGNOSIS — M3501 Sicca syndrome with keratoconjunctivitis: Secondary | ICD-10-CM | POA: Diagnosis not present

## 2016-08-11 DIAGNOSIS — M19042 Primary osteoarthritis, left hand: Secondary | ICD-10-CM | POA: Diagnosis not present

## 2016-08-12 DIAGNOSIS — L930 Discoid lupus erythematosus: Secondary | ICD-10-CM | POA: Diagnosis not present

## 2016-09-01 DIAGNOSIS — Z6824 Body mass index (BMI) 24.0-24.9, adult: Secondary | ICD-10-CM | POA: Diagnosis not present

## 2016-09-01 DIAGNOSIS — L02429 Furuncle of limb, unspecified: Secondary | ICD-10-CM | POA: Diagnosis not present

## 2016-09-02 DIAGNOSIS — M810 Age-related osteoporosis without current pathological fracture: Secondary | ICD-10-CM | POA: Diagnosis not present

## 2016-09-08 DIAGNOSIS — Z1231 Encounter for screening mammogram for malignant neoplasm of breast: Secondary | ICD-10-CM | POA: Diagnosis not present

## 2016-09-08 DIAGNOSIS — M810 Age-related osteoporosis without current pathological fracture: Secondary | ICD-10-CM | POA: Diagnosis not present

## 2016-09-08 DIAGNOSIS — E559 Vitamin D deficiency, unspecified: Secondary | ICD-10-CM | POA: Diagnosis not present

## 2016-09-08 DIAGNOSIS — R350 Frequency of micturition: Secondary | ICD-10-CM | POA: Diagnosis not present

## 2016-09-08 DIAGNOSIS — Z124 Encounter for screening for malignant neoplasm of cervix: Secondary | ICD-10-CM | POA: Diagnosis not present

## 2016-09-08 DIAGNOSIS — Z6824 Body mass index (BMI) 24.0-24.9, adult: Secondary | ICD-10-CM | POA: Diagnosis not present

## 2016-09-08 DIAGNOSIS — Z01419 Encounter for gynecological examination (general) (routine) without abnormal findings: Secondary | ICD-10-CM | POA: Diagnosis not present

## 2016-09-08 DIAGNOSIS — N898 Other specified noninflammatory disorders of vagina: Secondary | ICD-10-CM | POA: Diagnosis not present

## 2016-09-14 DIAGNOSIS — Z6824 Body mass index (BMI) 24.0-24.9, adult: Secondary | ICD-10-CM | POA: Diagnosis not present

## 2016-09-14 DIAGNOSIS — J984 Other disorders of lung: Secondary | ICD-10-CM | POA: Diagnosis not present

## 2016-09-14 DIAGNOSIS — J189 Pneumonia, unspecified organism: Secondary | ICD-10-CM | POA: Diagnosis not present

## 2016-10-03 DIAGNOSIS — R350 Frequency of micturition: Secondary | ICD-10-CM | POA: Diagnosis not present

## 2016-10-20 DIAGNOSIS — L930 Discoid lupus erythematosus: Secondary | ICD-10-CM | POA: Diagnosis not present

## 2016-11-02 DIAGNOSIS — Z9189 Other specified personal risk factors, not elsewhere classified: Secondary | ICD-10-CM | POA: Diagnosis not present

## 2016-11-11 DIAGNOSIS — M150 Primary generalized (osteo)arthritis: Secondary | ICD-10-CM | POA: Diagnosis not present

## 2016-11-11 DIAGNOSIS — M3501 Sicca syndrome with keratoconjunctivitis: Secondary | ICD-10-CM | POA: Diagnosis not present

## 2016-11-11 DIAGNOSIS — M064 Inflammatory polyarthropathy: Secondary | ICD-10-CM | POA: Diagnosis not present

## 2016-11-11 DIAGNOSIS — R252 Cramp and spasm: Secondary | ICD-10-CM | POA: Diagnosis not present

## 2016-11-11 DIAGNOSIS — Z79899 Other long term (current) drug therapy: Secondary | ICD-10-CM | POA: Diagnosis not present

## 2016-11-15 DIAGNOSIS — R112 Nausea with vomiting, unspecified: Secondary | ICD-10-CM | POA: Diagnosis not present

## 2016-11-15 DIAGNOSIS — Z6823 Body mass index (BMI) 23.0-23.9, adult: Secondary | ICD-10-CM | POA: Diagnosis not present

## 2017-01-09 DIAGNOSIS — Z9181 History of falling: Secondary | ICD-10-CM | POA: Diagnosis not present

## 2017-01-09 DIAGNOSIS — Z139 Encounter for screening, unspecified: Secondary | ICD-10-CM | POA: Diagnosis not present

## 2017-01-09 DIAGNOSIS — Z23 Encounter for immunization: Secondary | ICD-10-CM | POA: Diagnosis not present

## 2017-01-09 DIAGNOSIS — Z0000 Encounter for general adult medical examination without abnormal findings: Secondary | ICD-10-CM | POA: Diagnosis not present

## 2017-01-09 DIAGNOSIS — Z1389 Encounter for screening for other disorder: Secondary | ICD-10-CM | POA: Diagnosis not present

## 2017-01-25 DIAGNOSIS — I10 Essential (primary) hypertension: Secondary | ICD-10-CM | POA: Diagnosis not present

## 2017-01-25 DIAGNOSIS — F418 Other specified anxiety disorders: Secondary | ICD-10-CM | POA: Diagnosis not present

## 2017-01-25 DIAGNOSIS — J449 Chronic obstructive pulmonary disease, unspecified: Secondary | ICD-10-CM | POA: Diagnosis not present

## 2017-01-25 DIAGNOSIS — G4700 Insomnia, unspecified: Secondary | ICD-10-CM | POA: Diagnosis not present

## 2017-01-25 DIAGNOSIS — Z79899 Other long term (current) drug therapy: Secondary | ICD-10-CM | POA: Diagnosis not present

## 2017-02-13 DIAGNOSIS — M5417 Radiculopathy, lumbosacral region: Secondary | ICD-10-CM | POA: Diagnosis not present

## 2017-02-13 DIAGNOSIS — M79643 Pain in unspecified hand: Secondary | ICD-10-CM | POA: Diagnosis not present

## 2017-02-13 DIAGNOSIS — M810 Age-related osteoporosis without current pathological fracture: Secondary | ICD-10-CM | POA: Diagnosis not present

## 2017-02-13 DIAGNOSIS — M064 Inflammatory polyarthropathy: Secondary | ICD-10-CM | POA: Diagnosis not present

## 2017-02-13 DIAGNOSIS — M3501 Sicca syndrome with keratoconjunctivitis: Secondary | ICD-10-CM | POA: Diagnosis not present

## 2017-02-13 DIAGNOSIS — Z79899 Other long term (current) drug therapy: Secondary | ICD-10-CM | POA: Diagnosis not present

## 2017-02-13 DIAGNOSIS — M79644 Pain in right finger(s): Secondary | ICD-10-CM | POA: Diagnosis not present

## 2017-02-13 DIAGNOSIS — M150 Primary generalized (osteo)arthritis: Secondary | ICD-10-CM | POA: Diagnosis not present

## 2017-02-13 DIAGNOSIS — H3093 Unspecified chorioretinal inflammation, bilateral: Secondary | ICD-10-CM | POA: Diagnosis not present

## 2017-03-07 DIAGNOSIS — Z6824 Body mass index (BMI) 24.0-24.9, adult: Secondary | ICD-10-CM | POA: Diagnosis not present

## 2017-03-07 DIAGNOSIS — Z23 Encounter for immunization: Secondary | ICD-10-CM | POA: Diagnosis not present

## 2017-03-07 DIAGNOSIS — J984 Other disorders of lung: Secondary | ICD-10-CM | POA: Diagnosis not present

## 2017-03-28 DIAGNOSIS — M810 Age-related osteoporosis without current pathological fracture: Secondary | ICD-10-CM | POA: Diagnosis not present

## 2017-03-28 DIAGNOSIS — M4185 Other forms of scoliosis, thoracolumbar region: Secondary | ICD-10-CM | POA: Diagnosis not present

## 2017-03-28 DIAGNOSIS — M419 Scoliosis, unspecified: Secondary | ICD-10-CM | POA: Diagnosis not present

## 2017-04-14 DIAGNOSIS — J441 Chronic obstructive pulmonary disease with (acute) exacerbation: Secondary | ICD-10-CM | POA: Diagnosis not present

## 2017-04-14 DIAGNOSIS — J449 Chronic obstructive pulmonary disease, unspecified: Secondary | ICD-10-CM | POA: Diagnosis not present

## 2017-04-14 DIAGNOSIS — Z6823 Body mass index (BMI) 23.0-23.9, adult: Secondary | ICD-10-CM | POA: Diagnosis not present

## 2017-05-18 ENCOUNTER — Other Ambulatory Visit: Payer: Self-pay | Admitting: Neurosurgery

## 2017-05-18 ENCOUNTER — Ambulatory Visit
Admission: RE | Admit: 2017-05-18 | Discharge: 2017-05-18 | Disposition: A | Discharge status: Home / Self Care | Payer: PPO | Source: Ambulatory Visit | Attending: Neurosurgery | Admitting: Neurosurgery

## 2017-05-18 DIAGNOSIS — S32511A Fracture of superior rim of right pubis, initial encounter for closed fracture: Secondary | ICD-10-CM | POA: Diagnosis not present

## 2017-05-18 DIAGNOSIS — M25551 Pain in right hip: Secondary | ICD-10-CM

## 2017-05-18 DIAGNOSIS — M25552 Pain in left hip: Principal | ICD-10-CM

## 2017-06-08 DIAGNOSIS — M160 Bilateral primary osteoarthritis of hip: Secondary | ICD-10-CM | POA: Diagnosis not present

## 2017-06-08 DIAGNOSIS — M419 Scoliosis, unspecified: Secondary | ICD-10-CM | POA: Diagnosis not present

## 2017-06-11 ENCOUNTER — Other Ambulatory Visit: Payer: Self-pay | Admitting: Neurosurgery

## 2017-06-11 DIAGNOSIS — M419 Scoliosis, unspecified: Secondary | ICD-10-CM

## 2017-06-18 ENCOUNTER — Ambulatory Visit
Admission: RE | Admit: 2017-06-18 | Discharge: 2017-06-18 | Disposition: A | Discharge status: Home / Self Care | Payer: PPO | Source: Ambulatory Visit | Attending: Neurosurgery | Admitting: Neurosurgery

## 2017-06-18 DIAGNOSIS — M48061 Spinal stenosis, lumbar region without neurogenic claudication: Secondary | ICD-10-CM | POA: Diagnosis not present

## 2017-06-18 DIAGNOSIS — M419 Scoliosis, unspecified: Secondary | ICD-10-CM

## 2017-06-27 DIAGNOSIS — J069 Acute upper respiratory infection, unspecified: Secondary | ICD-10-CM | POA: Diagnosis not present

## 2017-06-27 DIAGNOSIS — J449 Chronic obstructive pulmonary disease, unspecified: Secondary | ICD-10-CM | POA: Diagnosis not present

## 2017-07-04 DIAGNOSIS — Z981 Arthrodesis status: Secondary | ICD-10-CM | POA: Diagnosis not present

## 2017-07-04 DIAGNOSIS — M47816 Spondylosis without myelopathy or radiculopathy, lumbar region: Secondary | ICD-10-CM | POA: Diagnosis not present

## 2017-07-04 DIAGNOSIS — M48061 Spinal stenosis, lumbar region without neurogenic claudication: Secondary | ICD-10-CM | POA: Diagnosis not present

## 2017-07-04 DIAGNOSIS — M5134 Other intervertebral disc degeneration, thoracic region: Secondary | ICD-10-CM | POA: Diagnosis not present

## 2017-07-04 DIAGNOSIS — M4185 Other forms of scoliosis, thoracolumbar region: Secondary | ICD-10-CM | POA: Diagnosis not present

## 2017-07-04 DIAGNOSIS — M4804 Spinal stenosis, thoracic region: Secondary | ICD-10-CM | POA: Diagnosis not present

## 2017-07-04 DIAGNOSIS — M419 Scoliosis, unspecified: Secondary | ICD-10-CM | POA: Diagnosis not present

## 2017-07-04 DIAGNOSIS — M4125 Other idiopathic scoliosis, thoracolumbar region: Secondary | ICD-10-CM | POA: Diagnosis not present

## 2017-07-04 DIAGNOSIS — M160 Bilateral primary osteoarthritis of hip: Secondary | ICD-10-CM | POA: Diagnosis not present

## 2017-07-04 DIAGNOSIS — Z79899 Other long term (current) drug therapy: Secondary | ICD-10-CM | POA: Diagnosis not present

## 2017-07-04 DIAGNOSIS — M47814 Spondylosis without myelopathy or radiculopathy, thoracic region: Secondary | ICD-10-CM | POA: Diagnosis not present

## 2017-07-04 DIAGNOSIS — M5136 Other intervertebral disc degeneration, lumbar region: Secondary | ICD-10-CM | POA: Diagnosis not present

## 2017-07-24 DIAGNOSIS — Z6823 Body mass index (BMI) 23.0-23.9, adult: Secondary | ICD-10-CM | POA: Diagnosis not present

## 2017-07-24 DIAGNOSIS — J441 Chronic obstructive pulmonary disease with (acute) exacerbation: Secondary | ICD-10-CM | POA: Diagnosis not present

## 2017-08-01 DIAGNOSIS — M3501 Sicca syndrome with keratoconjunctivitis: Secondary | ICD-10-CM | POA: Diagnosis not present

## 2017-08-01 DIAGNOSIS — M150 Primary generalized (osteo)arthritis: Secondary | ICD-10-CM | POA: Diagnosis not present

## 2017-08-01 DIAGNOSIS — M79643 Pain in unspecified hand: Secondary | ICD-10-CM | POA: Diagnosis not present

## 2017-08-01 DIAGNOSIS — M810 Age-related osteoporosis without current pathological fracture: Secondary | ICD-10-CM | POA: Diagnosis not present

## 2017-08-01 DIAGNOSIS — Z79899 Other long term (current) drug therapy: Secondary | ICD-10-CM | POA: Diagnosis not present

## 2017-08-01 DIAGNOSIS — M5417 Radiculopathy, lumbosacral region: Secondary | ICD-10-CM | POA: Diagnosis not present

## 2017-08-01 DIAGNOSIS — M064 Inflammatory polyarthropathy: Secondary | ICD-10-CM | POA: Diagnosis not present

## 2017-08-01 DIAGNOSIS — H3093 Unspecified chorioretinal inflammation, bilateral: Secondary | ICD-10-CM | POA: Diagnosis not present

## 2017-08-31 DIAGNOSIS — Z6823 Body mass index (BMI) 23.0-23.9, adult: Secondary | ICD-10-CM | POA: Diagnosis not present

## 2017-08-31 DIAGNOSIS — J441 Chronic obstructive pulmonary disease with (acute) exacerbation: Secondary | ICD-10-CM | POA: Diagnosis not present

## 2017-08-31 DIAGNOSIS — J449 Chronic obstructive pulmonary disease, unspecified: Secondary | ICD-10-CM | POA: Diagnosis not present

## 2017-09-20 DIAGNOSIS — M810 Age-related osteoporosis without current pathological fracture: Secondary | ICD-10-CM | POA: Diagnosis not present

## 2017-09-20 DIAGNOSIS — M419 Scoliosis, unspecified: Secondary | ICD-10-CM | POA: Diagnosis not present

## 2017-09-20 DIAGNOSIS — S92512A Displaced fracture of proximal phalanx of left lesser toe(s), initial encounter for closed fracture: Secondary | ICD-10-CM | POA: Diagnosis not present

## 2017-11-01 DIAGNOSIS — Z79899 Other long term (current) drug therapy: Secondary | ICD-10-CM | POA: Diagnosis not present

## 2017-11-01 DIAGNOSIS — M064 Inflammatory polyarthropathy: Secondary | ICD-10-CM | POA: Diagnosis not present

## 2017-12-06 DIAGNOSIS — L931 Subacute cutaneous lupus erythematosus: Secondary | ICD-10-CM | POA: Diagnosis not present

## 2018-02-01 DIAGNOSIS — M79642 Pain in left hand: Secondary | ICD-10-CM | POA: Diagnosis not present

## 2018-02-01 DIAGNOSIS — M19042 Primary osteoarthritis, left hand: Secondary | ICD-10-CM | POA: Diagnosis not present

## 2018-02-01 DIAGNOSIS — M150 Primary generalized (osteo)arthritis: Secondary | ICD-10-CM | POA: Diagnosis not present

## 2018-02-01 DIAGNOSIS — M3501 Sicca syndrome with keratoconjunctivitis: Secondary | ICD-10-CM | POA: Diagnosis not present

## 2018-02-01 DIAGNOSIS — M064 Inflammatory polyarthropathy: Secondary | ICD-10-CM | POA: Diagnosis not present

## 2018-02-01 DIAGNOSIS — M79643 Pain in unspecified hand: Secondary | ICD-10-CM | POA: Diagnosis not present

## 2018-02-01 DIAGNOSIS — M19041 Primary osteoarthritis, right hand: Secondary | ICD-10-CM | POA: Diagnosis not present

## 2018-02-01 DIAGNOSIS — M79641 Pain in right hand: Secondary | ICD-10-CM | POA: Diagnosis not present

## 2018-02-01 DIAGNOSIS — Z79899 Other long term (current) drug therapy: Secondary | ICD-10-CM | POA: Diagnosis not present

## 2018-02-01 DIAGNOSIS — M810 Age-related osteoporosis without current pathological fracture: Secondary | ICD-10-CM | POA: Diagnosis not present

## 2018-02-01 DIAGNOSIS — D8989 Other specified disorders involving the immune mechanism, not elsewhere classified: Secondary | ICD-10-CM | POA: Diagnosis not present

## 2018-02-01 DIAGNOSIS — D72819 Decreased white blood cell count, unspecified: Secondary | ICD-10-CM | POA: Diagnosis not present

## 2018-02-01 DIAGNOSIS — M5417 Radiculopathy, lumbosacral region: Secondary | ICD-10-CM | POA: Diagnosis not present

## 2018-02-01 DIAGNOSIS — M3500 Sicca syndrome, unspecified: Secondary | ICD-10-CM | POA: Diagnosis not present

## 2018-02-01 DIAGNOSIS — H3093 Unspecified chorioretinal inflammation, bilateral: Secondary | ICD-10-CM | POA: Diagnosis not present

## 2018-02-07 DIAGNOSIS — J449 Chronic obstructive pulmonary disease, unspecified: Secondary | ICD-10-CM | POA: Diagnosis not present

## 2018-02-07 DIAGNOSIS — J441 Chronic obstructive pulmonary disease with (acute) exacerbation: Secondary | ICD-10-CM | POA: Diagnosis not present

## 2018-02-07 DIAGNOSIS — Z6822 Body mass index (BMI) 22.0-22.9, adult: Secondary | ICD-10-CM | POA: Diagnosis not present

## 2018-02-13 DIAGNOSIS — M160 Bilateral primary osteoarthritis of hip: Secondary | ICD-10-CM | POA: Diagnosis not present

## 2018-02-13 DIAGNOSIS — M419 Scoliosis, unspecified: Secondary | ICD-10-CM | POA: Diagnosis not present

## 2018-02-13 DIAGNOSIS — M810 Age-related osteoporosis without current pathological fracture: Secondary | ICD-10-CM | POA: Diagnosis not present

## 2018-02-16 DIAGNOSIS — Z6822 Body mass index (BMI) 22.0-22.9, adult: Secondary | ICD-10-CM | POA: Diagnosis not present

## 2018-02-16 DIAGNOSIS — J441 Chronic obstructive pulmonary disease with (acute) exacerbation: Secondary | ICD-10-CM | POA: Diagnosis not present

## 2018-02-16 DIAGNOSIS — J029 Acute pharyngitis, unspecified: Secondary | ICD-10-CM | POA: Diagnosis not present

## 2018-02-20 ENCOUNTER — Ambulatory Visit
Admission: RE | Admit: 2018-02-20 | Discharge: 2018-02-20 | Disposition: A | Discharge status: Home / Self Care | Payer: PPO | Source: Ambulatory Visit | Attending: Physician Assistant | Admitting: Physician Assistant

## 2018-02-20 ENCOUNTER — Other Ambulatory Visit: Payer: Self-pay | Admitting: Physician Assistant

## 2018-02-20 DIAGNOSIS — J441 Chronic obstructive pulmonary disease with (acute) exacerbation: Secondary | ICD-10-CM

## 2018-02-20 DIAGNOSIS — R05 Cough: Secondary | ICD-10-CM | POA: Diagnosis not present

## 2018-02-26 DIAGNOSIS — Z6822 Body mass index (BMI) 22.0-22.9, adult: Secondary | ICD-10-CM | POA: Diagnosis not present

## 2018-02-26 DIAGNOSIS — Z1339 Encounter for screening examination for other mental health and behavioral disorders: Secondary | ICD-10-CM | POA: Diagnosis not present

## 2018-02-26 DIAGNOSIS — Z139 Encounter for screening, unspecified: Secondary | ICD-10-CM | POA: Diagnosis not present

## 2018-02-26 DIAGNOSIS — Z1331 Encounter for screening for depression: Secondary | ICD-10-CM | POA: Diagnosis not present

## 2018-02-26 DIAGNOSIS — Z23 Encounter for immunization: Secondary | ICD-10-CM | POA: Diagnosis not present

## 2018-02-26 DIAGNOSIS — I10 Essential (primary) hypertension: Secondary | ICD-10-CM | POA: Diagnosis not present

## 2018-02-26 DIAGNOSIS — R000 Tachycardia, unspecified: Secondary | ICD-10-CM | POA: Diagnosis not present

## 2018-02-26 DIAGNOSIS — J439 Emphysema, unspecified: Secondary | ICD-10-CM | POA: Diagnosis not present

## 2018-03-22 ENCOUNTER — Encounter: Payer: Self-pay | Admitting: Cardiovascular Disease

## 2018-03-22 ENCOUNTER — Ambulatory Visit (INDEPENDENT_AMBULATORY_CARE_PROVIDER_SITE_OTHER): Payer: PPO | Admitting: Cardiovascular Disease

## 2018-03-22 VITALS — BP 102/72 | HR 71 | Ht 64.0 in | Wt 124.8 lb

## 2018-03-22 DIAGNOSIS — I10 Essential (primary) hypertension: Secondary | ICD-10-CM | POA: Diagnosis not present

## 2018-03-22 DIAGNOSIS — R000 Tachycardia, unspecified: Secondary | ICD-10-CM

## 2018-03-22 MED ORDER — AMLODIPINE BESYLATE 2.5 MG PO TABS: 2.5000 mg | ORAL_TABLET | Freq: Every day | ORAL | 11 refills | Status: DC

## 2018-03-22 MED ORDER — METOPROLOL SUCCINATE ER 25 MG PO TB24: 25.0000 mg | ORAL_TABLET | Freq: Every day | ORAL | 3 refills | Status: DC

## 2018-03-22 NOTE — Patient Instructions (Addendum)
Medication Instructions:  Your physician has recommended you make the following change in your medication:   STOP Atenolol START Metoprolol (Toprol XL) 25 mg once daily DECREASE Amlodipine to 2.5 mg once daily  If you need a refill on your cardiac medications before your next appointment, please call your pharmacy.   Lab work: None Ordered  If you have labs (blood work) drawn today and your tests are completely normal, you will receive your results only by: Marland Kitchen MyChart Message (if you have MyChart) OR . A paper copy in the mail If you have any lab test that is abnormal or we need to change your treatment, we will call you to review the results.   Testing/Procedures: None Ordered   Follow-Up: At [facility], you and your health needs are our priority.  As part of our continuing mission to provide you with exceptional heart care, we have created designated Provider Care Teams.  These Care Teams include your primary Cardiologist (physician) and Advanced Practice Providers (APPs -  Physician Assistants and Nurse Practitioners) who all work together to provide you with the care you need, when you need it. You will need a follow up appointment in:  3 months. You may see Mertie Moores, MD or one of the following Advanced Practice Providers on your designated Care Team: Richardson Dopp, PA-C [place], [place] . Daune Perch, NP

## 2018-03-22 NOTE — Progress Notes (Signed)
Cardiology Office Note:    Date:  03/22/2018   ID:  Toni Myers, DOB April 23, 1951, MRN [id number]  PCP:  Cyndi Bender, PA-C  Cardiologist:  Mertie Moores, MD  Electrophysiologist:  None   Referring MD: Cyndi Bender, PA-C   Chief Complaint  Patient presents with  . Hypertension  . Tachycardia     Oct. 31, 2019   Toni Myers is a 67 y.o. female with a hx of  COPD, HTN, Lupus,   She has a history of hypertension.  She also has a history of sinus tachycardia.  We are asked to see her today by [name], PA for further evaluation of her hypertension and sinus tachycardia.  Toni Myers")  is seen back today . I've seen her in the remote past - for ? HTN She has COPD and has never smoked.   Had lots of 2nd hand smoke  She has had  Tachycardia  Was started on Atenolol and her tachycardia is better.   Seen Dr. Lamonte Sakai for her COPD .   Has been losing weight . - 20 lbs  Has lots of fatigue   Past Medical History:  Diagnosis Date  . Anxiety   . Arthritis    Rheumatoid  . Asthma   . Chronic iritis, left eye   . Chronic low back pain   . CKD (chronic kidney disease), stage II 10/08/2015  . Closed fracture of multiple pubic rami ([place]) 07/19/2013  . Closed fracture of pelvic rim 07/19/2013  . COPD (chronic obstructive pulmonary disease) ([place])    no o2, Dr.Robert Byrum   . Cough   . Dementia without behavioral disturbance ([place])   . Depression   . GERD (gastroesophageal reflux disease)   . Heart murmur   . Hypertension   . Migraine   . Palpitations   . RLS (restless legs syndrome)   . Shingles   . Shortness of breath dyspnea   . Sjogrens syndrome [facility])     Past Surgical History:  Procedure Laterality Date  . BACK SURGERY     07/1998 and 03/1999, herniated disc, 2 rod and 4 screws  . CARDIAC CATHETERIZATION     shadow no blockage  . COLONOSCOPY WITH PROPOFOL N/A 02/09/2015   Procedure: COLONOSCOPY WITH PROPOFOL;  Surgeon: Garlan Fair, MD;  Location: [facility]  ENDOSCOPY;  Service: Endoscopy;  Laterality: N/A;  . ESOPHAGOGASTRODUODENOSCOPY (EGD) WITH PROPOFOL N/A 02/09/2015   Procedure: ESOPHAGOGASTRODUODENOSCOPY (EGD) WITH PROPOFOL;  Surgeon: Garlan Fair, MD;  Location: [facility] ENDOSCOPY;  Service: Endoscopy;  Laterality: N/A;  . TUBAL LIGATION      Current Medications: Current Meds  Medication Sig  . albuterol (PROAIR HFA) 108 (90 BASE) MCG/ACT inhaler Inhale 2 puffs into the lungs every 6 (six) hours as needed for wheezing or shortness of breath.   Marland Kitchen albuterol (PROVENTIL) (2.5 MG/3ML) 0.083% nebulizer solution Take 2.5 mg by nebulization every 4 (four) hours as needed for wheezing or shortness of breath. Reported on 10/22/2015  . budesonide-formoterol (SYMBICORT) 160-4.5 MCG/ACT inhaler Inhale 2 puffs into the lungs 2 (two) times daily as needed (SOB).   . calcium citrate (CALCITRATE - DOSED IN MG ELEMENTAL CALCIUM) 950 MG tablet Take 200 mg of elemental calcium by mouth daily.  . cyclobenzaprine (FLEXERIL) 10 MG tablet Take 1 tablet (10 mg total) by mouth at bedtime as needed. For spasms  . escitalopram (LEXAPRO) 5 MG tablet Take 5 mg by mouth daily.  Marland Kitchen FOLIC ACID PO Take 2 tablets by  mouth daily.   . methotrexate 250 MG/10ML injection Inject 15 mg into the vein once. Injects 0.6 ml subcutaneously every Wed (in stomach).  . RESTASIS 0.05 % ophthalmic emulsion Place 2 drops into both eyes daily.  . [DISCONTINUED] amLODipine (NORVASC) 5 MG tablet Take 1 tablet by mouth daily.     Allergies:   Morphine and related   Social History   Socioeconomic History  . Marital status: Divorced    Spouse name: Not on file  . Number of children: Not on file  . Years of education: Not on file  . Highest education level: Not on file  Occupational History  . Not on file  Social Needs  . Financial resource strain: Not on file  . Food insecurity:    Worry: Not on file    Inability: Not on file  . Transportation needs:    Medical: Not on file     Non-medical: Not on file  Tobacco Use  . Smoking status: Never Smoker  . Smokeless tobacco: Never Used  Substance and Sexual Activity  . Alcohol use: Yes    Comment: 2 glasses monthly  . Drug use: No  . Sexual activity: Not on file  Lifestyle  . Physical activity:    Days per week: Not on file    Minutes per session: Not on file  . Stress: Not on file  Relationships  . Social connections:    Talks on phone: Not on file    Gets together: Not on file    Attends religious service: Not on file    Active member of club or organization: Not on file    Attends meetings of clubs or organizations: Not on file    Relationship status: Not on file  Other Topics Concern  . Not on file  Social History Narrative  . Not on file     Family History: The patient's family history includes Arthritis in her mother.  ROS:   Please see the history of present illness.     All other systems reviewed and are negative.  EKGs/Labs/Other Studies Reviewed:    The following studies were reviewed today:   EKG:   March 22, 2018: Normal sinus rhythm at 71 beats minute.  Normal EKG.  Recent Labs: No results found for requested labs within last 8760 hours.  Recent Lipid Panel No results found for: CHOL, TRIG, HDL, CHOLHDL, VLDL, LDLCALC, LDLDIRECT  Physical Exam:    VS:  BP 102/72   Pulse 71   Ht 5\' 4"  (1.626 m)   Wt 124 lb 12.8 oz (56.6 kg)   SpO2 96%   BMI 21.42 kg/m     Wt Readings from Last 3 Encounters:  03/22/18 124 lb 12.8 oz (56.6 kg)  01/19/16 140 lb (63.5 kg)  12/17/15 140 lb (63.5 kg)     GEN:  Well nourished, well developed in no acute distress HEENT: Normal NECK: No JVD; No carotid bruits LYMPHATICS: No lymphadenopathy CARDIAC: RRR, no murmurs, rubs, gallops RESPIRATORY:  Clear to auscultation without rales, wheezing or rhonchi  ABDOMEN: Soft, non-tender, non-distended MUSCULOSKELETAL:  No edema; No deformity  SKIN: Warm and dry NEUROLOGIC:  Alert and oriented x  3 PSYCHIATRIC:  Normal affect   ASSESSMENT:    1. Sinus tachycardia   2. Essential hypertension    PLAN:    In order of problems listed above:  1. Sinus tachycardia: Marticia is referred to [place] today by Samul Dada, PA for further evaluation of her sinus  tachycardia.  She has audible reasons to have sinus tachycardia including COPD, nebulizer therapy, Norvasc.  Her heart rate is normal today on atenolol 25 mg a day.  Seems to be doing well but did have some wheezing on exam.  I think that she might do better on metoprolol XL 25 mg a day.  It may be a little bit more cardioselective.  Discontinue the atenolol, start metoprolol XL 25 mg a day.  I will see her again in 3 months for follow-up visit.  2.  Hypertension: She is on amlodipine 5 mg a day and has been on this dose for years.  She is lost about 20 pounds.  Her blood pressure is on the low side.  I do not think that she needs nearly this much amlodipine.  We will decrease her dose to 2.5 mg a day.  She will continue to follow her blood pressure levels.   Medication Adjustments/Labs and Tests Ordered: Current medicines are reviewed at length with the patient today.  Concerns regarding medicines are outlined above.  Orders Placed This Encounter  Procedures  . EKG 12-Lead   Meds ordered this encounter  Medications  . amLODipine (NORVASC) 2.5 MG tablet    Sig: Take 1 tablet (2.5 mg total) by mouth daily.    Dispense:  30 tablet    Refill:  11  . metoprolol succinate (TOPROL XL) 25 MG 24 hr tablet    Sig: Take 1 tablet (25 mg total) by mouth daily.    Dispense:  90 tablet    Refill:  3     Patient Instructions  Medication Instructions:  Your physician has recommended you make the following change in your medication:   STOP Atenolol START Metoprolol (Toprol XL) 25 mg once daily DECREASE Amlodipine to 2.5 mg once daily  If you need a refill on your cardiac medications before your next appointment, please call your pharmacy.    Lab work: None Ordered  If you have labs (blood work) drawn today and your tests are completely normal, you will receive your results only by: Marland Kitchen MyChart Message (if you have MyChart) OR . A paper copy in the mail If you have any lab test that is abnormal or we need to change your treatment, we will call you to review the results.   Testing/Procedures: None Ordered   Follow-Up: At [facility], you and your health needs are our priority.  As part of our continuing mission to provide you with exceptional heart care, we have created designated Provider Care Teams.  These Care Teams include your primary Cardiologist (physician) and Advanced Practice Providers (APPs -  Physician Assistants and Nurse Practitioners) who all work together to provide you with the care you need, when you need it. You will need a follow up appointment in:  3 months. You may see Mertie Moores, MD or one of the following Advanced Practice Providers on your designated Care Team: Richardson Dopp, PA-C [place], [place] . Daune Perch, NP       Signed, Mertie Moores, MD  03/22/2018 4:18 PM    [facility]

## 2018-03-26 ENCOUNTER — Encounter: Payer: Self-pay | Admitting: Nurse Practitioner

## 2018-03-26 ENCOUNTER — Ambulatory Visit: Payer: PPO | Admitting: Nurse Practitioner

## 2018-03-26 DIAGNOSIS — R0989 Other specified symptoms and signs involving the circulatory and respiratory systems: Secondary | ICD-10-CM | POA: Diagnosis not present

## 2018-03-26 MED ORDER — DOXYCYCLINE HYCLATE 100 MG PO TABS: 100.0000 mg | ORAL_TABLET | Freq: Two times a day (BID) | ORAL | 0 refills | Status: DC

## 2018-03-26 MED ORDER — PREDNISONE 10 MG PO TABS: ORAL_TABLET | ORAL | 0 refills | Status: DC

## 2018-03-26 NOTE — Assessment & Plan Note (Signed)
Patient Instructions  Will order doxycycline  Will order prednisone taper Continue current medications Follow up with Dr. Lamonte Sakai  - as scheduled or sooner if symptoms worsen

## 2018-03-26 NOTE — Progress Notes (Signed)
@Patient  ID: Toni Myers, female    DOB: 11-01-1950, 67 y.o.   MRN: [id number]  Chief Complaint  Patient presents with  . Follow-up    Referring provider: Cyndi Bender, PA-C  HPI 67 year old female never smoker with history of Sjogren's, HTN, depression followed by Dr. Lamonte Sakai.   Tests:  CT scan 10/09/11 --  Comparison: Chest radiograph 10/07/2011  Findings: There is rotatory scoliosis noted.  No axillary or supraclavicular lymphadenopathy. No mediastinal or  hilar lymphadenopathy. No pericardial fluid. Esophagus is normal.  Review of the lung parenchyma demonstrates some small foci of  peripheral nodularity within the left lower lobe and lingula which  is felt represent atelectasis. No clear pneumonia is evident.  There is no pleural fluid.  Limited view of the upper abdomen is unremarkable.  Limited view of the skeleton demonstrates no aggressive osseous  lesions.  IMPRESSION:  1. Several foci of atelectasis within the left lower lobe and  lingula are likely related to scoliosis. Small foci of infection  are felt less likely.  2. Sigmoid rotatory scoliosis  CXR 08/04/2014  Report/ NAD   PFT Results Latest Ref Rng & Units 10/02/2014  FVC-Pre L 1.47  FVC-Predicted Pre % 48  FVC-Post L 1.50  FVC-Predicted Post % 49  Pre FEV1/FVC % % 77  Post FEV1/FCV % % 78  FEV1-Pre L 1.14  FEV1-Predicted Pre % 49  DLCO UNC% % 69  DLCO COR %Predicted % 104  TLC L 4.12  TLC % Predicted % 84  RV % Predicted % 122    OV 03/26/18 - follow up Patient presents for a follow up visit. This has been a stable visit for her. She states that she has been doing really well. She has developed a cough over the past couple of weeks. States that she is concerned about it. The cough has been non productive. She denies any fever, chest pain, or edema. Denies any sinus congestion or postnasal drip.       Allergies  Allergen Reactions  . Morphine And Related Nausea And Vomiting    "makes me  sick at high doses"  . Prednisone     Causes insomnia by the 3rd day of taking.prednisone taper.    Immunization History  Administered Date(s) Administered  . Influenza Split 02/27/2012, 02/20/2014, 01/29/2015  . Influenza Whole 02/21/2011  . Pneumococcal-Unspecified 06/29/2009  . Tdap 10/27/2015    Past Medical History:  Diagnosis Date  . Anxiety   . Arthritis    Rheumatoid  . Asthma   . Chronic iritis, left eye   . Chronic low back pain   . CKD (chronic kidney disease), stage II 10/08/2015  . Closed fracture of multiple pubic rami ([place]) 07/19/2013  . Closed fracture of pelvic rim 07/19/2013  . COPD (chronic obstructive pulmonary disease) ([place])    no o2, Dr.Robert Byrum   . Cough   . Dementia without behavioral disturbance ([place])   . Depression   . GERD (gastroesophageal reflux disease)   . Heart murmur   . Hypertension   . Migraine   . Palpitations   . RLS (restless legs syndrome)   . Shingles   . Shortness of breath dyspnea   . Sjogrens syndrome ([place])     Tobacco History: Social History   Tobacco Use  Smoking Status Never Smoker  Smokeless Tobacco Never Used   Counseling given: Yes   Outpatient Encounter Medications as of 03/26/2018  Medication Sig  . albuterol (PROAIR HFA) 108 (  90 BASE) MCG/ACT inhaler Inhale 2 puffs into the lungs every 6 (six) hours as needed for wheezing or shortness of breath.   Marland Kitchen albuterol (PROVENTIL) (2.5 MG/3ML) 0.083% nebulizer solution Take 2.5 mg by nebulization every 4 (four) hours as needed for wheezing or shortness of breath. Reported on 10/22/2015  . budesonide-formoterol (SYMBICORT) 160-4.5 MCG/ACT inhaler Inhale 2 puffs into the lungs 2 (two) times daily as needed (SOB).   . calcium citrate (CALCITRATE - DOSED IN MG ELEMENTAL CALCIUM) 950 MG tablet Take 200 mg of elemental calcium by mouth daily.  . cyclobenzaprine (FLEXERIL) 10 MG tablet Take 1 tablet (10 mg total) by mouth at bedtime as needed. For spasms  . escitalopram  (LEXAPRO) 5 MG tablet Take 5 mg by mouth daily.  Marland Kitchen FOLIC ACID PO Take 2 tablets by mouth daily.   Marland Kitchen gabapentin (NEURONTIN) 300 MG capsule Take 1 capsule by mouth at bedtime.  . methotrexate 250 MG/10ML injection Inject 15 mg into the vein once. Injects 0.6 ml subcutaneously every Wed (in stomach).  . metoprolol succinate (TOPROL XL) 25 MG 24 hr tablet Take 1 tablet (25 mg total) by mouth daily.  . RESTASIS 0.05 % ophthalmic emulsion Place 2 drops into both eyes daily.  Marland Kitchen doxycycline (VIBRA-TABS) 100 MG tablet Take 1 tablet (100 mg total) by mouth 2 (two) times daily.  . predniSONE (DELTASONE) 10 MG tablet Take 3 tabs for 2 days, then 2 tabs for 2 days, then 1 tab for 2 days, then stop  . [DISCONTINUED] amLODipine (NORVASC) 2.5 MG tablet Take 1 tablet (2.5 mg total) by mouth daily. (Patient not taking: Reported on 03/26/2018)   No facility-administered encounter medications on file as of 03/26/2018.      Review of Systems  Review of Systems  Constitutional: Negative.  Negative for chills and fever.  HENT: Negative.   Respiratory: Positive for cough. Negative for shortness of breath and wheezing.   Cardiovascular: Negative.  Negative for chest pain.  Gastrointestinal: Negative.   Allergic/Immunologic: Negative.   Neurological: Negative.   Psychiatric/Behavioral: Negative.        Physical Exam  BP 126/70 (BP Location: Left Arm, Patient Position: Sitting, Cuff Size: Normal)   Pulse 86   Ht 5\' 4"  (1.626 m)   Wt 124 lb (56.2 kg)   SpO2 97%   BMI 21.28 kg/m   Wt Readings from Last 5 Encounters:  03/26/18 124 lb (56.2 kg)  03/22/18 124 lb 12.8 oz (56.6 kg)  01/19/16 140 lb (63.5 kg)  12/17/15 140 lb (63.5 kg)  10/22/15 133 lb (60.3 kg)     Physical Exam  Constitutional: She is oriented to person, place, and time. She appears well-developed and well-nourished. No distress.  Cardiovascular: Normal rate and regular rhythm.  Pulmonary/Chest: Effort normal and breath sounds  normal. No respiratory distress. She has no wheezes.  Neurological: She is alert and oriented to person, place, and time.  Psychiatric: She has a normal mood and affect.  Nursing note and vitals reviewed.    Assessment & Plan:   Chest congestion Patient Instructions  Will order doxycycline  Will order prednisone taper Continue current medications Follow up with Dr. Lamonte Sakai  - as scheduled or sooner if symptoms worsen       Fenton Foy, NP 03/26/2018

## 2018-03-26 NOTE — Patient Instructions (Addendum)
Will order doxycycline  Will order prednisone taper Continue current medications Follow up with Dr. Lamonte Sakai  - as scheduled or sooner if symptoms worsen

## 2018-03-27 DIAGNOSIS — Z6822 Body mass index (BMI) 22.0-22.9, adult: Secondary | ICD-10-CM | POA: Diagnosis not present

## 2018-03-27 DIAGNOSIS — I10 Essential (primary) hypertension: Secondary | ICD-10-CM | POA: Diagnosis not present

## 2018-03-27 DIAGNOSIS — S51811A Laceration without foreign body of right forearm, initial encounter: Secondary | ICD-10-CM | POA: Diagnosis not present

## 2018-03-27 DIAGNOSIS — R000 Tachycardia, unspecified: Secondary | ICD-10-CM | POA: Diagnosis not present

## 2018-04-17 IMAGING — CT CT CHEST HIGH RESOLUTION W/O CM
2 of 6 series · 15 of 36 positions shown, 18 images · non-contrast
Comparison: 10/11/2015 high-resolution chest CT. 10/14/2015 chest
radiograph.

CLINICAL DATA: Persistent productive cough. Reported history is
pneumonitis.

EXAM:
CT CHEST WITHOUT CONTRAST
TECHNIQUE: Multidetector CT imaging of the chest was performed following the
standard protocol without intravenous contrast. High resolution
imaging of the lungs, as well as inspiratory and expiratory imaging,
was performed.

[Series 4: high resolution · axial · 0.57mm/px · z∈[-345,-53]mm · 12 of 164 slices shown, 15 images]
[im 9/164  mediastinal]
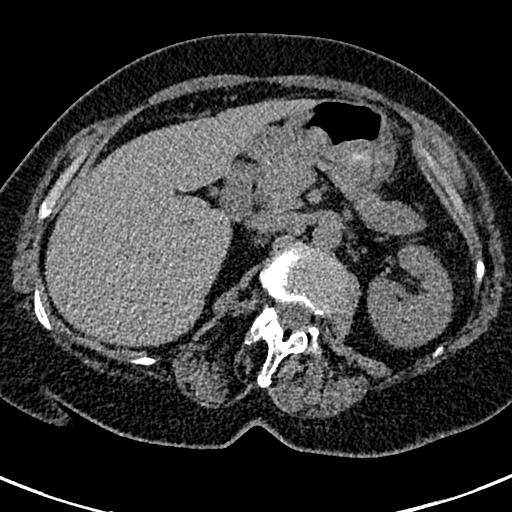
[im 9/164  lung]
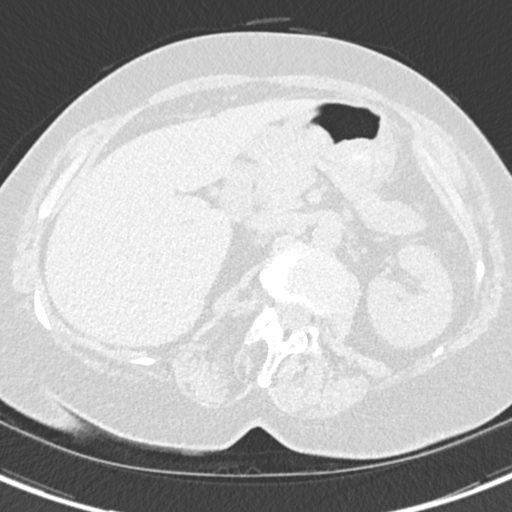
[im 26/164  lung]
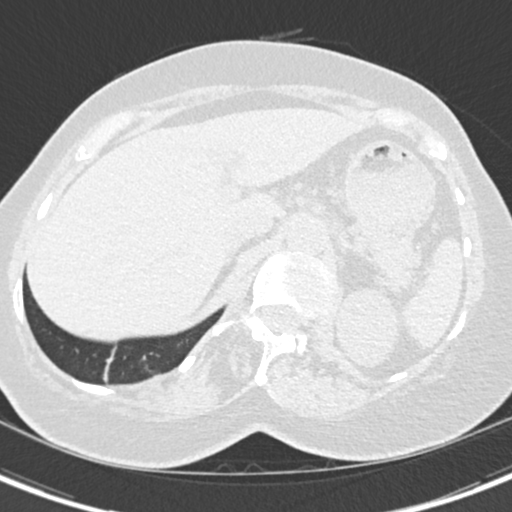
[im 35/164  lung]
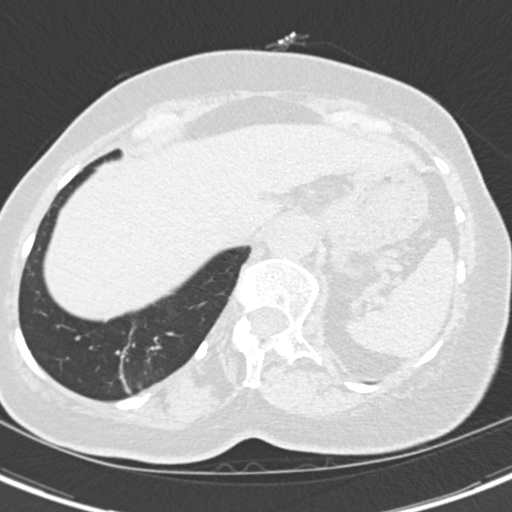
[im 52/164  lung]
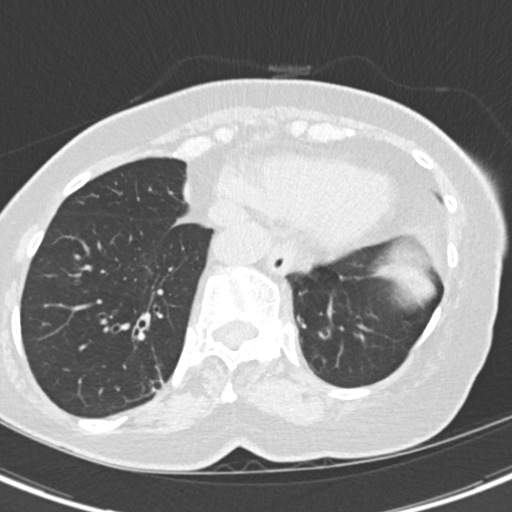
[im 61/164  mediastinal]
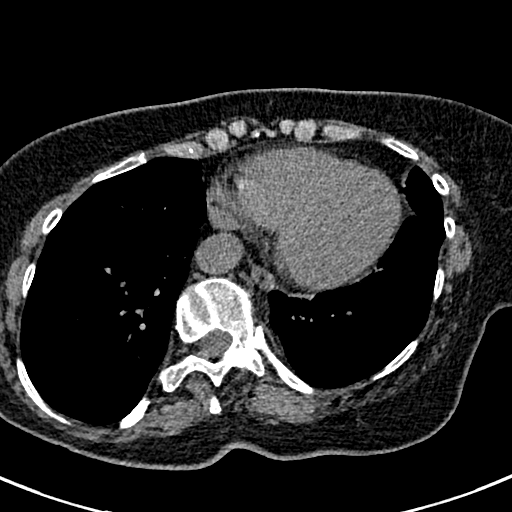
[im 61/164  lung]
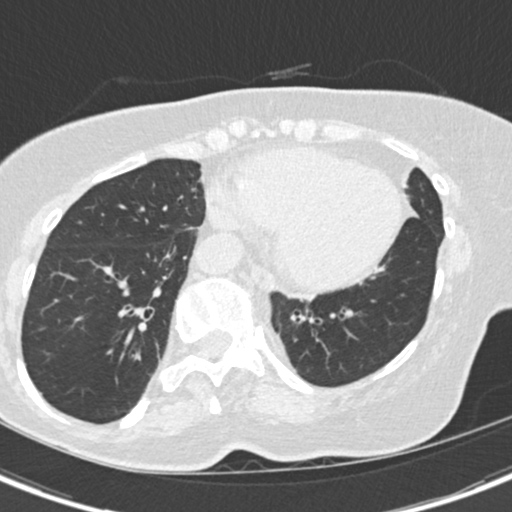
[im 78/164  lung]
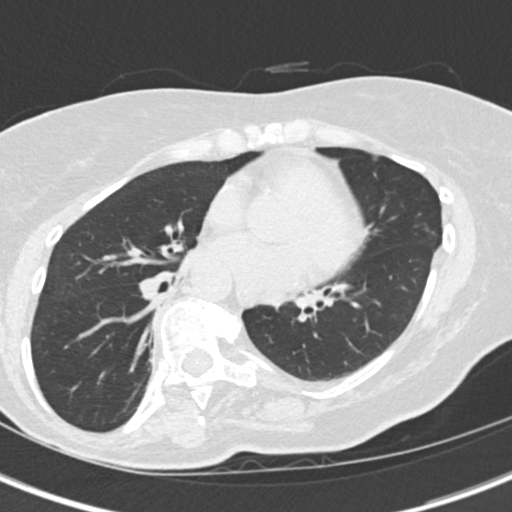
[im 86/164  lung]
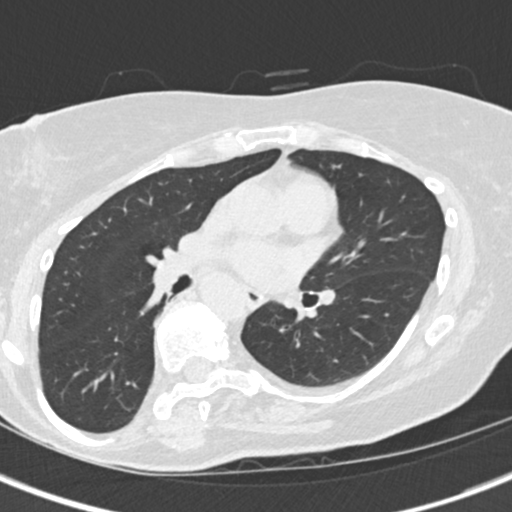
[im 103/164  lung]
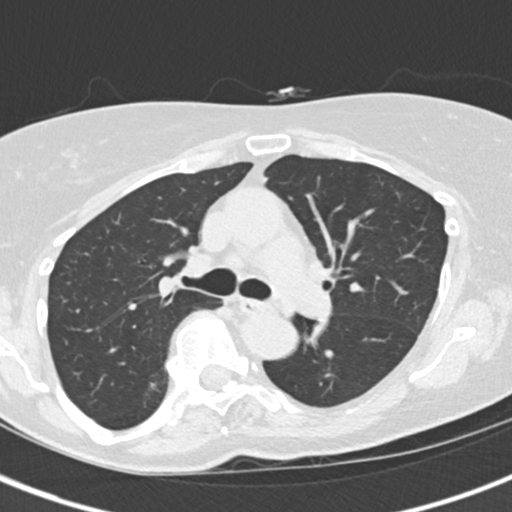
[im 112/164  mediastinal]
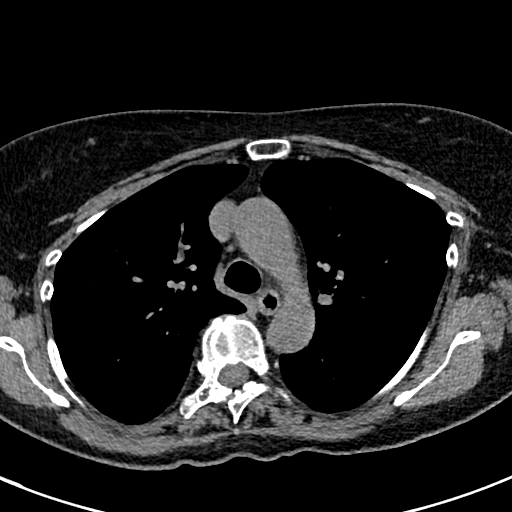
[im 112/164  lung]
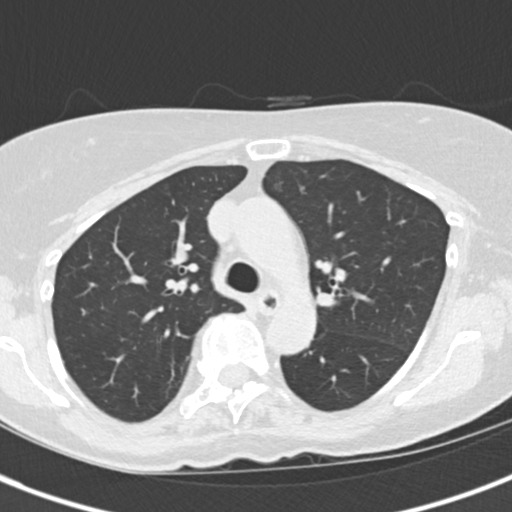
[im 129/164  lung]
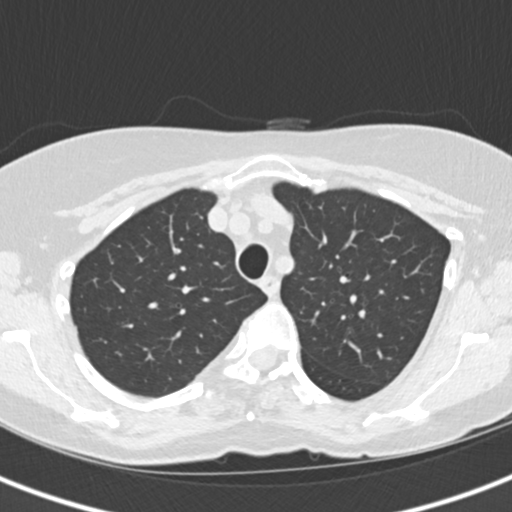
[im 138/164  lung]
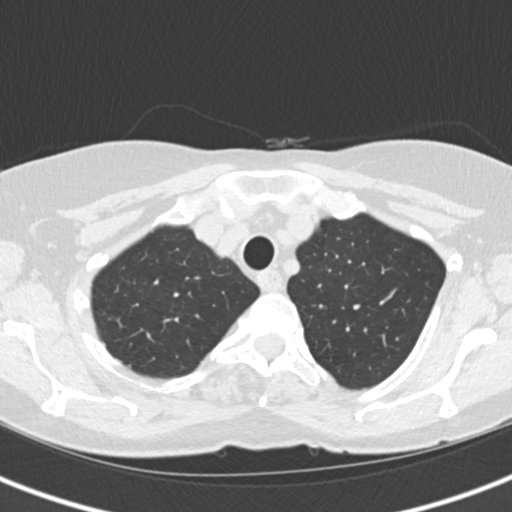
[im 155/164  lung]
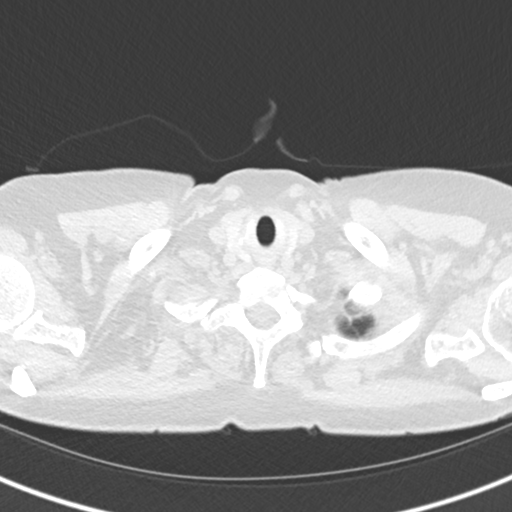

[Series 8: coronal · coronal · 0.59mm/px · 3 of 108 slices shown]
[im 22/108  lung]
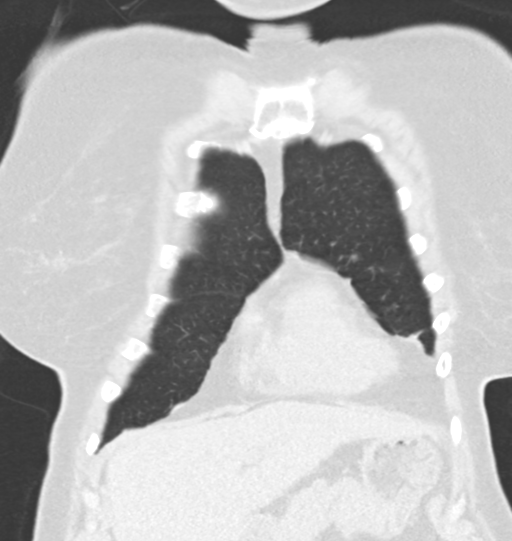
[im 43/108  lung]
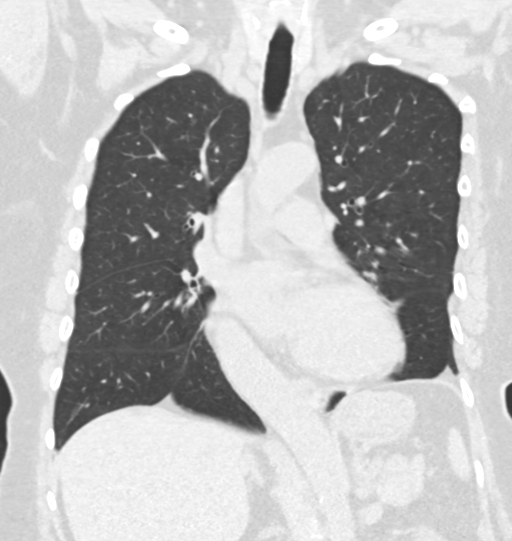
[im 65/108  lung]
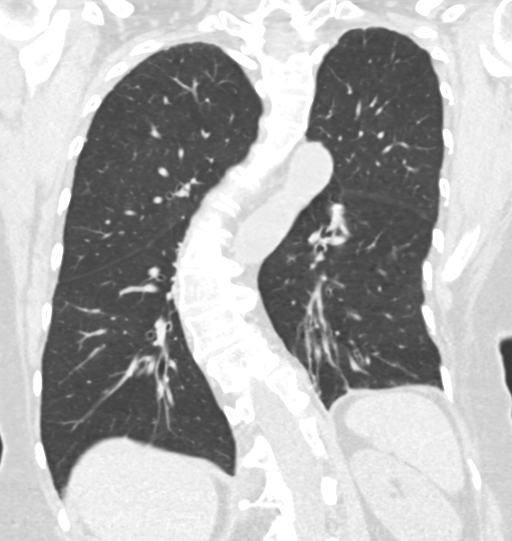

[15 of 36 positions shown; findings below may reference images not displayed]

FINDINGS: Mediastinum/Nodes: Normal heart size. No significant pericardial
fluid/thickening. Mildly atherosclerotic nonaneurysmal thoracic
aorta. Normal caliber pulmonary arteries. No discrete thyroid
nodules. Unremarkable esophagus. No pathologically enlarged
axillary, mediastinal or gross hilar lymph nodes, noting limited
sensitivity for the detection of hilar adenopathy on this
noncontrast study.

Lungs/Pleura: No pneumothorax. No pleural effusion. No acute
consolidative airspace disease, lung masses or new significant
pulmonary nodules. There a few scattered tiny pulmonary nodules in
both lungs, largest 4 mm in the anterior left upper lobe (series 5/
image 86), all unchanged since 10/11/2015. Re- demonstrated is
minimal upper lobe predominant faint patchy centrilobular
ground-glass opacity in both lungs, not appreciably changed. No
significant regions of subpleural reticulation, traction
bronchiectasis or frank honeycombing. A few scattered parenchymal
bands in the lingula and lower lobes are unchanged and consistent
with postinfectious/ postinflammatory scarring. No significant air
trapping on the expiration sequence.

Upper abdomen: Small hiatal hernia.

Musculoskeletal: No aggressive appearing focal osseous lesions.
Stable severe dextroscoliosis of the thoracic spine with associated
moderate spondylotic change.
IMPRESSION: 1. No acute abnormality. No appreciable change in the minimal
centrilobular ground-glass nodularity in the upper lungs, which
could represent subacute hypersensitivity pneumonitis. No
significant air trapping.
2. Additional findings include aortic atherosclerosis, small hiatal
hernia and severe thoracic spine dextroscoliosis.

## 2018-04-23 DIAGNOSIS — S60562S Insect bite (nonvenomous) of left hand, sequela: Secondary | ICD-10-CM | POA: Diagnosis not present

## 2018-05-09 DIAGNOSIS — M419 Scoliosis, unspecified: Secondary | ICD-10-CM | POA: Diagnosis not present

## 2018-05-10 ENCOUNTER — Ambulatory Visit: Payer: PPO | Admitting: Emergency Medicine

## 2018-05-14 DIAGNOSIS — Z6821 Body mass index (BMI) 21.0-21.9, adult: Secondary | ICD-10-CM | POA: Diagnosis not present

## 2018-05-14 DIAGNOSIS — J449 Chronic obstructive pulmonary disease, unspecified: Secondary | ICD-10-CM | POA: Diagnosis not present

## 2018-05-21 DIAGNOSIS — J449 Chronic obstructive pulmonary disease, unspecified: Secondary | ICD-10-CM | POA: Diagnosis not present

## 2018-05-21 DIAGNOSIS — Z6821 Body mass index (BMI) 21.0-21.9, adult: Secondary | ICD-10-CM | POA: Diagnosis not present

## 2018-05-23 HISTORY — PX: EYE SURGERY: SHX253

## 2018-05-24 ENCOUNTER — Encounter: Payer: Self-pay | Admitting: Primary Care

## 2018-05-24 ENCOUNTER — Observation Stay (HOSPITAL_COMMUNITY)
Admission: EM | Admit: 2018-05-24 | Discharge: 2018-05-27 | Disposition: A | Discharge status: Home / Self Care | Payer: PPO | Attending: Internal Medicine | Admitting: Internal Medicine

## 2018-05-24 ENCOUNTER — Encounter (HOSPITAL_COMMUNITY): Payer: Self-pay

## 2018-05-24 ENCOUNTER — Emergency Department (HOSPITAL_COMMUNITY): Payer: PPO

## 2018-05-24 ENCOUNTER — Other Ambulatory Visit: Payer: Self-pay

## 2018-05-24 ENCOUNTER — Ambulatory Visit: Payer: PPO | Admitting: Primary Care

## 2018-05-24 VITALS — BP 102/78 | HR 103 | Ht 64.0 in | Wt 118.2 lb

## 2018-05-24 DIAGNOSIS — J449 Chronic obstructive pulmonary disease, unspecified: Secondary | ICD-10-CM | POA: Diagnosis present

## 2018-05-24 DIAGNOSIS — J441 Chronic obstructive pulmonary disease with (acute) exacerbation: Secondary | ICD-10-CM | POA: Diagnosis not present

## 2018-05-24 DIAGNOSIS — R0602 Shortness of breath: Secondary | ICD-10-CM

## 2018-05-24 DIAGNOSIS — K219 Gastro-esophageal reflux disease without esophagitis: Secondary | ICD-10-CM | POA: Insufficient documentation

## 2018-05-24 DIAGNOSIS — Z7951 Long term (current) use of inhaled steroids: Secondary | ICD-10-CM | POA: Insufficient documentation

## 2018-05-24 DIAGNOSIS — F0390 Unspecified dementia without behavioral disturbance: Secondary | ICD-10-CM | POA: Diagnosis present

## 2018-05-24 DIAGNOSIS — F419 Anxiety disorder, unspecified: Secondary | ICD-10-CM | POA: Diagnosis not present

## 2018-05-24 DIAGNOSIS — M3500 Sicca syndrome, unspecified: Secondary | ICD-10-CM | POA: Insufficient documentation

## 2018-05-24 DIAGNOSIS — Z885 Allergy status to narcotic agent status: Secondary | ICD-10-CM | POA: Insufficient documentation

## 2018-05-24 DIAGNOSIS — R0989 Other specified symptoms and signs involving the circulatory and respiratory systems: Secondary | ICD-10-CM | POA: Diagnosis not present

## 2018-05-24 DIAGNOSIS — M069 Rheumatoid arthritis, unspecified: Secondary | ICD-10-CM | POA: Diagnosis not present

## 2018-05-24 DIAGNOSIS — N182 Chronic kidney disease, stage 2 (mild): Secondary | ICD-10-CM | POA: Diagnosis not present

## 2018-05-24 DIAGNOSIS — G8929 Other chronic pain: Secondary | ICD-10-CM | POA: Insufficient documentation

## 2018-05-24 DIAGNOSIS — I129 Hypertensive chronic kidney disease with stage 1 through stage 4 chronic kidney disease, or unspecified chronic kidney disease: Secondary | ICD-10-CM | POA: Insufficient documentation

## 2018-05-24 DIAGNOSIS — M545 Low back pain: Secondary | ICD-10-CM | POA: Insufficient documentation

## 2018-05-24 DIAGNOSIS — I10 Essential (primary) hypertension: Secondary | ICD-10-CM | POA: Diagnosis not present

## 2018-05-24 DIAGNOSIS — R0902 Hypoxemia: Secondary | ICD-10-CM | POA: Diagnosis not present

## 2018-05-24 DIAGNOSIS — J4541 Moderate persistent asthma with (acute) exacerbation: Secondary | ICD-10-CM

## 2018-05-24 DIAGNOSIS — G2581 Restless legs syndrome: Secondary | ICD-10-CM | POA: Insufficient documentation

## 2018-05-24 DIAGNOSIS — F329 Major depressive disorder, single episode, unspecified: Secondary | ICD-10-CM | POA: Insufficient documentation

## 2018-05-24 LAB — CBC WITH DIFFERENTIAL/PLATELET
ABS IMMATURE GRANULOCYTES: 0.02 10*3/uL (ref 0.00–0.07)
BASOS ABS: 0 10*3/uL (ref 0.0–0.1)
Basophils Relative: 0 %
Eosinophils Absolute: 0.1 10*3/uL (ref 0.0–0.5)
Eosinophils Relative: 1 %
HEMATOCRIT: 40.4 % (ref 36.0–46.0)
HEMOGLOBIN: 13.4 g/dL (ref 12.0–15.0)
IMMATURE GRANULOCYTES: 0 %
LYMPHS ABS: 1.5 10*3/uL (ref 0.7–4.0)
LYMPHS PCT: 23 %
MCH: 32.6 pg (ref 26.0–34.0)
MCHC: 33.2 g/dL (ref 30.0–36.0)
MCV: 98.3 fL (ref 80.0–100.0)
MONOS PCT: 10 %
Monocytes Absolute: 0.6 10*3/uL (ref 0.1–1.0)
NEUTROS ABS: 4.1 10*3/uL (ref 1.7–7.7)
NEUTROS PCT: 66 %
NRBC: 0 % (ref 0.0–0.2)
Platelets: 441 10*3/uL — ABNORMAL HIGH (ref 150–400)
RBC: 4.11 MIL/uL (ref 3.87–5.11)
RDW: 14.6 % (ref 11.5–15.5)
WBC: 6.3 10*3/uL (ref 4.0–10.5)

## 2018-05-24 LAB — BASIC METABOLIC PANEL
ANION GAP: 15 (ref 5–15)
BUN: 14 mg/dL (ref 8–23)
CHLORIDE: 106 mmol/L (ref 98–111)
CO2: 21 mmol/L — AB (ref 22–32)
Calcium: 9 mg/dL (ref 8.9–10.3)
Creatinine, Ser: 0.79 mg/dL (ref 0.44–1.00)
GFR calc non Af Amer: >60 mL/min (ref >60)
GLUCOSE: 102 mg/dL — AB (ref 70–99)
POTASSIUM: 3.2 mmol/L — AB (ref 3.5–5.1)
Sodium: 142 mmol/L (ref 135–145)

## 2018-05-24 LAB — I-STAT VENOUS BLOOD GAS, ED
Acid-Base Excess: 2 mmol/L (ref 0.0–2.0)
BICARBONATE: 24.8 mmol/L (ref 20.0–28.0)
O2 Saturation: 37 %
PH VEN: 7.498 — AB (ref 7.250–7.430)
PO2 VEN: 19 mmHg — AB (ref 32.0–45.0)
TCO2: 26 mmol/L (ref 22–32)
pCO2, Ven: 31.9 mmHg — ABNORMAL LOW (ref 44.0–60.0)

## 2018-05-24 LAB — I-STAT TROPONIN, ED: Troponin i, poc: 0 ng/mL (ref 0.00–0.08)

## 2018-05-24 MED ORDER — CYCLOBENZAPRINE HCL 10 MG PO TABS: 10.0000 mg | ORAL_TABLET | Freq: Every evening | ORAL | Status: DC | PRN

## 2018-05-24 MED ORDER — ESCITALOPRAM OXALATE 10 MG PO TABS
5.0000 mg | ORAL_TABLET | Freq: Every day | ORAL | Status: DC
  Administered 2018-05-24 – 2018-05-27 (×4): 5 mg via ORAL
  Filled 2018-05-24 (×4): qty 1

## 2018-05-24 MED ORDER — GABAPENTIN 300 MG PO CAPS: 300.0000 mg | ORAL_CAPSULE | Freq: Every evening | ORAL | Status: DC | PRN

## 2018-05-24 MED ORDER — ENOXAPARIN SODIUM 40 MG/0.4ML ~~LOC~~ SOLN
40.0000 mg | SUBCUTANEOUS | Status: DC
  Administered 2018-05-24 – 2018-05-26 (×3): 40 mg via SUBCUTANEOUS
  Filled 2018-05-24 (×3): qty 0.4

## 2018-05-24 MED ORDER — ALBUTEROL SULFATE (2.5 MG/3ML) 0.083% IN NEBU: 2.5000 mg | INHALATION_SOLUTION | RESPIRATORY_TRACT | Status: DC | PRN

## 2018-05-24 MED ORDER — IPRATROPIUM-ALBUTEROL 0.5-2.5 (3) MG/3ML IN SOLN
3.0000 mL | Freq: Four times a day (QID) | RESPIRATORY_TRACT | Status: DC
  Administered 2018-05-24 (×2): 3 mL via RESPIRATORY_TRACT
  Filled 2018-05-24: qty 3

## 2018-05-24 MED ORDER — METHYLPREDNISOLONE SODIUM SUCC 125 MG IJ SOLR
60.0000 mg | Freq: Two times a day (BID) | INTRAMUSCULAR | Status: AC
  Administered 2018-05-24 – 2018-05-25 (×2): 60 mg via INTRAVENOUS
  Filled 2018-05-24 (×2): qty 2

## 2018-05-24 MED ORDER — METOPROLOL SUCCINATE 12.5 MG HALF TABLET
12.5000 mg | ORAL_TABLET | Freq: Two times a day (BID) | ORAL | Status: DC
  Administered 2018-05-24 – 2018-05-27 (×6): 12.5 mg via ORAL
  Filled 2018-05-24 (×7): qty 1

## 2018-05-24 MED ORDER — IPRATROPIUM-ALBUTEROL 0.5-2.5 (3) MG/3ML IN SOLN
3.0000 mL | Freq: Once | RESPIRATORY_TRACT | Status: AC
Start: 2018-05-24 — End: 2018-05-24
  Administered 2018-05-24: 3 mL via RESPIRATORY_TRACT
  Filled 2018-05-24: qty 3

## 2018-05-24 MED ORDER — MOMETASONE FURO-FORMOTEROL FUM 200-5 MCG/ACT IN AERO
2.0000 | INHALATION_SPRAY | Freq: Two times a day (BID) | RESPIRATORY_TRACT | Status: DC
  Administered 2018-05-24 – 2018-05-27 (×6): 2 via RESPIRATORY_TRACT
  Filled 2018-05-24: qty 8.8

## 2018-05-24 MED ORDER — SODIUM CHLORIDE 0.9 % IV SOLN
1.0000 g | INTRAVENOUS | Status: DC
  Administered 2018-05-24 – 2018-05-26 (×3): 1 g via INTRAVENOUS
  Filled 2018-05-24 (×3): qty 10

## 2018-05-24 MED ORDER — ALBUTEROL (5 MG/ML) CONTINUOUS INHALATION SOLN
10.0000 mg/h | INHALATION_SOLUTION | Freq: Once | RESPIRATORY_TRACT | Status: AC
  Administered 2018-05-24: 10 mg/h via RESPIRATORY_TRACT
  Filled 2018-05-24: qty 20

## 2018-05-24 MED ORDER — MAGNESIUM SULFATE 2 GM/50ML IV SOLN
2.0000 g | Freq: Once | INTRAVENOUS | Status: AC
  Administered 2018-05-24: 2 g via INTRAVENOUS
  Filled 2018-05-24: qty 50

## 2018-05-24 MED ORDER — ALBUTEROL SULFATE (2.5 MG/3ML) 0.083% IN NEBU
5.0000 mg | INHALATION_SOLUTION | Freq: Once | RESPIRATORY_TRACT | Status: AC
  Administered 2018-05-24: 5 mg via RESPIRATORY_TRACT
  Filled 2018-05-24: qty 6

## 2018-05-24 MED ORDER — PREDNISONE 20 MG PO TABS: 40.0000 mg | ORAL_TABLET | Freq: Every day | ORAL | Status: DC

## 2018-05-24 MED ORDER — METHYLPREDNISOLONE ACETATE 80 MG/ML IJ SUSP
80.0000 mg | Freq: Once | INTRAMUSCULAR | Status: AC
  Administered 2018-05-24: 80 mg via INTRAMUSCULAR

## 2018-05-24 MED ORDER — CYCLOSPORINE 0.05 % OP EMUL
2.0000 [drp] | Freq: Every day | OPHTHALMIC | Status: DC
  Administered 2018-05-25 – 2018-05-27 (×3): 2 [drp] via OPHTHALMIC
  Filled 2018-05-24 (×5): qty 1

## 2018-05-24 MED ORDER — IPRATROPIUM-ALBUTEROL 0.5-2.5 (3) MG/3ML IN SOLN
3.0000 mL | RESPIRATORY_TRACT | Status: AC
  Administered 2018-05-24: 3 mL via RESPIRATORY_TRACT

## 2018-05-24 NOTE — Progress Notes (Signed)
@Patient  ID: Toni Myers, female    DOB: Sep 13, 1950, 68 y.o.   MRN: [id number]  Chief Complaint  Patient presents with  . Shortness of Breath    Went to [facility] twice in two weeks for sob/chest tightness    Referring provider: Cyndi Bender, PA-C  HPI: 68 year old female, never smoked. PMH significant for asthma (mixed obstructive and restrictive disease), vocal cord dysfunction, acute respiratory failure with hypoxia, rheumatoid arthritis, sjogrens disease, CKD stage 2, GERD. Patient of Dr. Lamonte Sakai.   05/24/2018 Patient presents today with acute complaints of shortness of breath, chest tightness and occasional cough with small amounts of green sputum x 10 days. Reports low grade temp at home. No recent CXR. Recently seen at [facility] on December 23rd and 30th for asthma exacerbation. Advised ED admission but patient declined. Given bronchodilators, steroid injection and oral prednisone. Completed course of oral Levaquin and is currently still taking Doxycyline twice daily. Continues Symbicort 2 puffs twice a day and has been requiring Albuterol nebulizer 3-4 times a day with little improvement. Afebrile. Denies chest pain, nausea, vomiting.    PFTs 10/02/14  - FVC 1.50 (49%), FEV1 1.17 (50%), ratio 78   Allergies  Allergen Reactions  . Morphine And Related Nausea And Vomiting    "makes me sick at high doses"  . Prednisone     Causes insomnia by the 3rd day of taking.prednisone taper.    Immunization History  Administered Date(s) Administered  . Influenza Split 02/27/2012, 02/20/2014, 01/29/2015  . Influenza Whole 02/21/2011  . Pneumococcal-Unspecified 06/29/2009  . Tdap 10/27/2015    Past Medical History:  Diagnosis Date  . Anxiety   . Arthritis    Rheumatoid  . Asthma   . Chronic iritis, left eye   . Chronic low back pain   . CKD (chronic kidney disease), stage II 10/08/2015  . Closed fracture of multiple pubic rami ([place]) 07/19/2013  . Closed  fracture of pelvic rim 07/19/2013  . COPD (chronic obstructive pulmonary disease) ([place])    no o2, Dr.Robert Byrum   . Cough   . Dementia without behavioral disturbance ([place])   . Depression   . GERD (gastroesophageal reflux disease)   . Heart murmur   . Hypertension   . Migraine   . Palpitations   . RLS (restless legs syndrome)   . Shingles   . Shortness of breath dyspnea   . Sjogrens syndrome ([place])     Tobacco History: Social History   Tobacco Use  Smoking Status Never Smoker  Smokeless Tobacco Never Used   Counseling given: Not Answered   Outpatient Medications Prior to Visit  Medication Sig Dispense Refill  . albuterol (PROAIR HFA) 108 (90 BASE) MCG/ACT inhaler Inhale 2 puffs into the lungs every 6 (six) hours as needed for wheezing or shortness of breath.     Marland Kitchen albuterol (PROVENTIL) (2.5 MG/3ML) 0.083% nebulizer solution Take 2.5 mg by nebulization every 4 (four) hours as needed for wheezing or shortness of breath. Reported on 10/22/2015    . atenolol (TENORMIN) 25 MG tablet Take 1 tablet by mouth daily. 1/2 tablet    . budesonide-formoterol (SYMBICORT) 160-4.5 MCG/ACT inhaler Inhale 2 puffs into the lungs 2 (two) times daily as needed (SOB).     . calcium citrate (CALCITRATE - DOSED IN MG ELEMENTAL CALCIUM) 950 MG tablet Take 200 mg of elemental calcium by mouth daily.    . cyclobenzaprine (FLEXERIL) 10 MG tablet Take 1 tablet (10 mg total)  by mouth at bedtime as needed. For spasms 20 tablet 0  . doxycycline (VIBRA-TABS) 100 MG tablet Take 1 tablet (100 mg total) by mouth 2 (two) times daily. 14 tablet 0  . DOXYCYCLINE PO Take 500 mg by mouth 2 (two) times daily.    Marland Kitchen escitalopram (LEXAPRO) 5 MG tablet Take 5 mg by mouth daily.    Marland Kitchen FOLIC ACID PO Take 2 tablets by mouth daily.     Marland Kitchen gabapentin (NEURONTIN) 300 MG capsule Take 1 capsule by mouth at bedtime.    . methotrexate 250 MG/10ML injection Inject 15 mg into the vein once. Injects 0.6 ml subcutaneously every Wed (in  stomach).    . metoprolol succinate (TOPROL XL) 25 MG 24 hr tablet Take 1 tablet (25 mg total) by mouth daily. 90 tablet 3  . oxyCODONE-acetaminophen (PERCOCET/ROXICET) 5-325 MG tablet Take by mouth.    . predniSONE (DELTASONE) 10 MG tablet Take 3 tabs for 2 days, then 2 tabs for 2 days, then 1 tab for 2 days, then stop 12 tablet 0  . RESTASIS 0.05 % ophthalmic emulsion Place 2 drops into both eyes daily.    . zoledronic acid (RECLAST) 5 MG/100ML SOLN injection Inject into the vein.     No facility-administered medications prior to visit.     Review of Systems  Review of Systems  Constitutional: Negative.   Respiratory: Positive for chest tightness, shortness of breath and wheezing.   Cardiovascular: Negative.     Physical Exam  BP 102/78 (BP Location: Right Arm, Patient Position: Sitting, Cuff Size: Normal)   Pulse (!) 103   Ht 5\' 4"  (1.626 m)   Wt 118 lb 3.2 oz (53.6 kg)   SpO2 98%   BMI 20.29 kg/m  Physical Exam Constitutional:      General: She is in acute distress.     Appearance: She is well-developed. She is not ill-appearing.  Neck:     Musculoskeletal: Normal range of motion and neck supple.  Cardiovascular:     Rate and Rhythm: Regular rhythm. Tachycardia present.  Pulmonary:     Effort: Tachypnea present.     Breath sounds: No stridor. Examination of the right-lower field reveals decreased breath sounds. Examination of the left-lower field reveals decreased breath sounds. Decreased breath sounds present. No wheezing, rhonchi or rales.     Comments: Upper airway wheeze  Musculoskeletal: Normal range of motion.  Skin:    General: Skin is warm and dry.  Neurological:     General: No focal deficit present.     Mental Status: She is alert and oriented to person, place, and time.  Psychiatric:        Mood and Affect: Mood is anxious.        Behavior: Behavior normal.      Lab Results:  CBC    Component Value Date/Time   WBC 8.6 10/14/2015 0400   RBC 3.54  (L) 10/14/2015 0400   HGB 11.2 (L) 10/14/2015 0400   HCT 33.9 (L) 10/14/2015 0400   PLT 255 10/14/2015 0400   MCV 95.8 10/14/2015 0400   MCH 31.6 10/14/2015 0400   MCHC 33.0 10/14/2015 0400   RDW 15.2 10/14/2015 0400   LYMPHSABS 0.2 (L) 10/14/2015 0400   MONOABS 0.2 10/14/2015 0400   EOSABS 0.0 10/14/2015 0400   BASOSABS 0.0 10/14/2015 0400    BMET    Component Value Date/Time   NA 135 10/14/2015 0400   K 4.3 10/14/2015 0400   CL 100 (L)  10/14/2015 0400   CO2 27 10/14/2015 0400   GLUCOSE 129 (H) 10/14/2015 0400   BUN 16 10/14/2015 0400   CREATININE 0.63 10/14/2015 0400   CALCIUM 8.4 (L) 10/14/2015 0400   GFRNONAA >60 10/14/2015 0400   GFRAA >60 10/14/2015 0400    BNP No results found for: BNP  ProBNP    Component Value Date/Time   PROBNP 59.0 08/04/2014 1056    Imaging: No results found.   Assessment & Plan:   Asthma exacerbation - Patient appears to be struggling to breath, lungs are diminished at bases - Received Duoneb treatment and Depo-medrol 80mg  IM x1in office with no significant improvement  - Called  and [facility] no bed available for direct admission. Advised ED via EMS - Dr. Melvyn Novas in to see patient, agreeing with plan    Martyn Ehrich, NP 05/24/2018

## 2018-05-24 NOTE — Assessment & Plan Note (Addendum)
-   Patient appears to be struggling to breath, lungs are diminished at bases - Received Duoneb treatment and Depo-medrol 80mg  IM x1in office with no significant improvement  - Called [facility] and [facility] no bed available for direct admission. Advised ED via EMS - Dr. Melvyn Novas in to see patient, agreeing with plan

## 2018-05-24 NOTE — ED Provider Notes (Signed)
[facility] EMERGENCY DEPARTMENT Provider Note   CSN: [id number] Arrival date & time: 05/24/18  1130     History   Chief Complaint Chief Complaint  Patient presents with  . Shortness of Breath    HPI Toni Myers is a 68 y.o. female with a past medical history of COPD, CKD who presents to ED for 10-day history of shortness of breath, wheezing, chest pressure, cough productive with green mucus.  Patient was seen and evaluated by her PCP and pulmonologist twice and symptoms began.  She was recommended to be admitted however, she wanted to spend the holidays with her family so she refused.  She was given Levaquin which she completed.  She is currently on doxycycline.  She has been using her home albuterol inhaler and nebulizer treatments with no improvement in her symptoms.  Patient states that she had similar symptoms about 2 years ago and had to be admitted for 12 days.  Daughter at bedside states that "if we catch it early, it doesn't get this bad."  She does not wear supplemental oxygen at home.  Denies any fevers, hemoptysis, leg swelling, sick contacts.  HPI  Past Medical History:  Diagnosis Date  . Anxiety   . Arthritis    Rheumatoid  . Asthma   . Chronic iritis, left eye   . Chronic low back pain   . CKD (chronic kidney disease), stage II 10/08/2015  . Closed fracture of multiple pubic rami ([place]) 07/19/2013  . Closed fracture of pelvic rim 07/19/2013  . COPD (chronic obstructive pulmonary disease) ([place])    no o2, Dr.Robert Byrum   . Cough   . Dementia without behavioral disturbance ([place])   . Depression   . GERD (gastroesophageal reflux disease)   . Heart murmur   . Hypertension   . Migraine   . Palpitations   . RLS (restless legs syndrome)   . Shingles   . Shortness of breath dyspnea   . Sjogrens syndrome [facility])     Patient Active Problem List   Diagnosis Date Noted  . Sinus tachycardia 03/22/2018  . Pneumonitis 12/17/2015  . Bronchospasm   .  Bronchospasm with bronchitis, acute   . Chest congestion   . Low TSH level 10/10/2015  . Palpitations 10/10/2015  . Chest tightness 10/10/2015  . CKD (chronic kidney disease), stage II 10/08/2015  . Acute respiratory failure with hypoxia ([place]) 10/06/2015  . Vocal cord dysfunction 10/02/2014  . Asthmatic bronchitis 08/04/2014  . Dementia without behavioral disturbance ([place])   . GERD (gastroesophageal reflux disease)   . Uveitis 07/22/2013  . Rheumatoid arthritis ([place]) 07/22/2013  . Depression 08/31/2011  . Asthma exacerbation 08/31/2011  . RLS (restless legs syndrome) 08/31/2011  . Sjogren's disease ([place]) 08/31/2011  . Essential hypertension 08/06/2007  . Cough 08/06/2007    Past Surgical History:  Procedure Laterality Date  . BACK SURGERY     07/1998 and 03/1999, herniated disc, 2 rod and 4 screws  . CARDIAC CATHETERIZATION     shadow no blockage  . COLONOSCOPY WITH PROPOFOL N/A 02/09/2015   Procedure: COLONOSCOPY WITH PROPOFOL;  Surgeon: Garlan Fair, MD;  Location: [facility] ENDOSCOPY;  Service: Endoscopy;  Laterality: N/A;  . ESOPHAGOGASTRODUODENOSCOPY (EGD) WITH PROPOFOL N/A 02/09/2015   Procedure: ESOPHAGOGASTRODUODENOSCOPY (EGD) WITH PROPOFOL;  Surgeon: Garlan Fair, MD;  Location: [facility] ENDOSCOPY;  Service: Endoscopy;  Laterality: N/A;  . TUBAL LIGATION       OB History    Gravida  2  Para  2   Term  2   Preterm      AB      Living        SAB      TAB      Ectopic      Multiple      Live Births               Home Medications    Prior to Admission medications   Medication Sig Start Date End Date Taking? Authorizing Provider  albuterol (PROAIR HFA) 108 (90 BASE) MCG/ACT inhaler Inhale 2 puffs into the lungs every 6 (six) hours as needed for wheezing or shortness of breath.    Yes [provider]  albuterol (PROVENTIL) (2.5 MG/3ML) 0.083% nebulizer solution Take 2.5 mg by nebulization every 4 (four) hours as needed for wheezing or  shortness of breath. Reported on 10/22/2015   Yes [provider]  atenolol (TENORMIN) 25 MG tablet Take 12.5 mg by mouth daily. 1/2 tablet  02/26/18  Yes [provider]  budesonide-formoterol (SYMBICORT) 160-4.5 MCG/ACT inhaler Inhale 2 puffs into the lungs 2 (two) times daily as needed (SOB).    Yes [provider]  calcium citrate (CALCITRATE - DOSED IN MG ELEMENTAL CALCIUM) 950 MG tablet Take 200 mg of elemental calcium by mouth daily.   Yes [provider]  cyclobenzaprine (FLEXERIL) 10 MG tablet Take 1 tablet (10 mg total) by mouth at bedtime as needed. For spasms 07/22/13  Yes Short, Noah Delaine, MD  doxycycline (VIBRA-TABS) 100 MG tablet Take 1 tablet (100 mg total) by mouth 2 (two) times daily. 03/26/18  Yes Fenton Foy, NP  escitalopram (LEXAPRO) 5 MG tablet Take 5 mg by mouth daily.   Yes [provider]  FOLIC ACID PO Take 2 tablets by mouth daily.    Yes [provider]  gabapentin (NEURONTIN) 300 MG capsule Take 1 capsule by mouth at bedtime as needed (leg pain).  03/25/18  Yes [provider]  methotrexate 250 MG/10ML injection Inject 17.5 mg into the vein once. Injects 0.7 ml subcutaneously every Wed (in stomach).   Yes [provider]  metoprolol succinate (TOPROL XL) 25 MG 24 hr tablet Take 1 tablet (25 mg total) by mouth daily. Patient taking differently: Take 12.5 mg by mouth daily.  03/22/18  Yes Nahser, Wonda Cheng, MD  oxyCODONE-acetaminophen (PERCOCET/ROXICET) 5-325 MG tablet Take by mouth. 05/11/18  Yes [provider]  predniSONE (DELTASONE) 10 MG tablet Take 3 tabs for 2 days, then 2 tabs for 2 days, then 1 tab for 2 days, then stop 03/26/18  Yes Lazaro Arms S, NP  RESTASIS 0.05 % ophthalmic emulsion Place 2 drops into both eyes daily. 09/24/15  Yes [provider]  zoledronic acid (RECLAST) 5 MG/100ML SOLN injection Inject into the vein. 05/11/18  Yes [provider]    Family  History Family History  Problem Relation Age of Onset  . Arthritis Mother     Social History Social History   Tobacco Use  . Smoking status: Never Smoker  . Smokeless tobacco: Never Used  Substance Use Topics  . Alcohol use: Yes    Comment: 2 glasses monthly  . Drug use: No     Allergies   Morphine and related and Prednisone   Review of Systems Review of Systems  Constitutional: Negative for appetite change, chills and fever.  HENT: Negative for ear pain, rhinorrhea, sneezing and sore throat.   Eyes: Negative for photophobia  and visual disturbance.  Respiratory: Positive for cough, chest tightness, shortness of breath and wheezing.   Cardiovascular: Negative for chest pain and palpitations.  Gastrointestinal: Negative for abdominal pain, blood in stool, constipation, diarrhea, nausea and vomiting.  Genitourinary: Negative for dysuria, hematuria and urgency.  Musculoskeletal: Negative for myalgias.  Skin: Negative for rash.  Neurological: Negative for dizziness, weakness and light-headedness.     Physical Exam Updated Vital Signs BP 130/72   Pulse 82   Temp (!) 97.3 F (36.3 C) (Axillary) Comment: Pt receiving breathing treatment (axillary temp recorded)  Resp 20   SpO2 100%   Physical Exam Vitals signs and nursing note reviewed.  Constitutional:      General: She is not in acute distress.    Appearance: She is well-developed.     Comments: 3L of oxygen being delivered via nasal cannula.  HENT:     Head: Normocephalic and atraumatic.     Nose: Nose normal.  Eyes:     General: No scleral icterus.       Left eye: No discharge.     Conjunctiva/sclera: Conjunctivae normal.  Neck:     Musculoskeletal: Normal range of motion and neck supple.  Cardiovascular:     Rate and Rhythm: Normal rate and regular rhythm.     Heart sounds: Normal heart sounds. No murmur. No friction rub. No gallop.   Pulmonary:     Effort: Pulmonary effort is normal. Tachypnea present.  No respiratory distress.     Breath sounds: Normal breath sounds.     Comments: Diminished breath sounds globally. Abdominal:     General: Bowel sounds are normal. There is no distension.     Palpations: Abdomen is soft.     Tenderness: There is no abdominal tenderness. There is no guarding.  Musculoskeletal: Normal range of motion.  Skin:    General: Skin is warm and dry.     Findings: No rash.  Neurological:     Mental Status: She is alert.     Motor: No abnormal muscle tone.     Coordination: Coordination normal.      ED Treatments / Results  Labs (all labs ordered are listed, but only abnormal results are displayed) Labs Reviewed  BASIC METABOLIC PANEL - Abnormal; Notable for the following components:      Result Value   Potassium 3.2 (*)    CO2 21 (*)    Glucose, Bld 102 (*)    All other components within normal limits  CBC WITH DIFFERENTIAL/PLATELET - Abnormal; Notable for the following components:   Platelets 441 (*)    All other components within normal limits  I-STAT VENOUS BLOOD GAS, ED - Abnormal; Notable for the following components:   pH, Ven 7.498 (*)    pCO2, Ven 31.9 (*)    pO2, Ven 19.0 (*)    All other components within normal limits  I-STAT TROPONIN, ED    EKG None  Radiology Dg Chest 2 View  Result Date: 05/24/2018 CLINICAL DATA:  68 year old female with shortness of breath for 10 days. Recently on antibiotics and steroids without improvement. On oxygen. Decreased lung sounds. EXAM: CHEST - 2 VIEW COMPARISON:  02/20/2018 and earlier. FINDINGS: Moderate to severe chronic thoracolumbar scoliosis, dextroconvex in the thoracic spine. Mediastinal contours remain within normal limits. Stable lung volumes with no pneumothorax, pulmonary edema, pleural effusion or acute pulmonary opacity. Chronic linear scarring in the right lower lobe. Visualized tracheal air column is within normal limits. No acute osseous abnormality identified.  Negative visible bowel gas  pattern. IMPRESSION: No acute cardiopulmonary abnormality. Advanced scoliosis again noted. Electronically Signed   By: Genevie Ann M.D.   On: 05/24/2018 12:46    Procedures Procedures (including critical care time)  CRITICAL CARE Performed by: Delia Heady   Total critical care time: 35 minutes  Critical care time was exclusive of separately billable procedures and treating other patients.  Critical care was necessary to treat or prevent imminent or life-threatening deterioration.  Critical care was time spent personally by me on the following activities: development of treatment plan with patient and/or surrogate as well as nursing, discussions with consultants, evaluation of patient's response to treatment, examination of patient, obtaining history from patient or surrogate, ordering and performing treatments and interventions, ordering and review of laboratory studies, ordering and review of radiographic studies, pulse oximetry and re-evaluation of patient's condition.   Medications Ordered in ED Medications  magnesium sulfate IVPB 2 g 50 mL (has no administration in time range)  albuterol (PROVENTIL,VENTOLIN) solution continuous neb (has no administration in time range)  albuterol (PROVENTIL) (2.5 MG/3ML) 0.083% nebulizer solution 5 mg (5 mg Nebulization Given 05/24/18 1320)  ipratropium-albuterol (DUONEB) 0.5-2.5 (3) MG/3ML nebulizer solution 3 mL (3 mLs Nebulization Given 05/24/18 1500)     Initial Impression / Assessment and Plan / ED Course  I have reviewed the triage vital signs and the nursing notes.  Pertinent labs & imaging results that were available during my care of the patient were reviewed by me and considered in my medical decision making (see chart for details).     68 year old female with a past medical history of COPD presents to ED for 10-day history of shortness of breath, chest pressure and tightness, cough productive with green mucus.  She finished her course of  Levaquin and is currently on doxycycline.  She was seen by her pulmonologist today in addition to twice in the past week and a half and they did recommend admission.  However, patient declined due to the holidays.  No improvement with her home albuterol inhaler or nebulizer machine at home.  She notes history of similar symptoms about 2 years ago and needed to be admitted for 12 days for management.  Denies any fevers, hemoptysis, leg swelling or sick contacts.  On my exam breath sounds are diminished globally.  I reviewed the office visit note from earlier today and she was given 80 mg of IM Depo-Medrol x1, DuoNeb there.  Patient continues to be tachypneic, reports chest pressure sensation and shortness of breath.  Will give continuous albuterol, magnesium and admit per pulmonary recommendations.   Portions of this note were generated with [employer]. Dictation errors may occur despite best attempts at proofreading.  Final Clinical Impressions(s) / ED Diagnoses   Final diagnoses:  COPD exacerbation [facility])    ED Discharge Orders    None       Delia Heady, PA-C 05/24/18 [place], MD 05/25/18 [phone]

## 2018-05-24 NOTE — Patient Instructions (Addendum)
Advise ED via EMS for acute asthma exacerbation

## 2018-05-24 NOTE — H&P (Signed)
History and Physical    Toni Myers:[id number] DOB: [birth year] DOA: 05/24/2018  PCP: Cyndi Bender, PA-C Consultants:  Nahser - cardiology; Byrum - pulmonology; Carloyn Manner - neurosurgery Patient coming from:  Home - lives alone (son is staying with her); NOK: Daughter, [phone]  Chief Complaint: SOB  HPI: Toni Myers is a 68 y.o. female with medical history significant of Sjogren's syndrome; RLS; HTN; depression; dementia; COPD; and strage 3 CKD presenting with SOB.  She has COPD, has never smoked.  She has about 2 episodes of year and needs hospitlaization every few years.  Doctors tried to admit her on 12/23 and 12/30.  She knew she was going to be admitted today and he sent her by ambulance to the hospital.  She has had extreme tightness and SOB.  She has been using Symbicort and nebs at home without relief.  On 12/23, she was given Levaquin x 7 days and prednisone taper.  This last week, she was given doxy BID and prednisone.  "Low-grade fever".   ED Course:  COPD exacerbation.  Sick x 10 days, seen provider twice and refused admission due to the holidays.  Seen by pulm today but plan for direct admission but no beds available.  On continuous neb now, tight and not moving air well.  Not hypoxic, just increased WOB.  Given steroids and mag.  Review of Systems: As per HPI; otherwise review of systems reviewed and negative.   Ambulatory Status:  Ambulates without assistance  Past Medical History:  Diagnosis Date  . Anxiety   . Arthritis    Rheumatoid  . Asthma   . Chronic iritis, left eye   . Chronic low back pain   . CKD (chronic kidney disease), stage II 10/08/2015  . Closed fracture of multiple pubic rami ([place]) 07/19/2013  . Closed fracture of pelvic rim 07/19/2013  . COPD (chronic obstructive pulmonary disease) ([place])    no o2, Dr.Robert Byrum   . Cough   . Dementia without behavioral disturbance ([place])   . Depression   . GERD (gastroesophageal reflux disease)   . Heart murmur    . Hypertension   . Migraine   . Palpitations   . RLS (restless legs syndrome)   . Shingles   . Sjogrens syndrome [facility])     Past Surgical History:  Procedure Laterality Date  . BACK SURGERY     07/1998 and 03/1999, herniated disc, 2 rod and 4 screws  . CARDIAC CATHETERIZATION     shadow no blockage  . COLONOSCOPY WITH PROPOFOL N/A 02/09/2015   Procedure: COLONOSCOPY WITH PROPOFOL;  Surgeon: Garlan Fair, MD;  Location: [facility];  Service: Endoscopy;  Laterality: N/A;  . ESOPHAGOGASTRODUODENOSCOPY (EGD) WITH PROPOFOL N/A 02/09/2015   Procedure: ESOPHAGOGASTRODUODENOSCOPY (EGD) WITH PROPOFOL;  Surgeon: Garlan Fair, MD;  Location: [facility];  Service: Endoscopy;  Laterality: N/A;  . TUBAL LIGATION      Social History   Socioeconomic History  . Marital status: Divorced    Spouse name: Not on file  . Number of children: Not on file  . Years of education: Not on file  . Highest education level: Not on file  Occupational History  . Occupation: retired  Scientific laboratory technician  . Financial resource strain: Not on file  . Food insecurity:    Worry: Not on file    Inability: Not on file  . Transportation needs:    Medical: Not on file    Non-medical: Not on file  Tobacco  Use  . Smoking status: Never Smoker  . Smokeless tobacco: Never Used  Substance and Sexual Activity  . Alcohol use: Yes    Comment: 2 glasses monthly  . Drug use: No  . Sexual activity: Not on file  Lifestyle  . Physical activity:    Days per week: Not on file    Minutes per session: Not on file  . Stress: Not on file  Relationships  . Social connections:    Talks on phone: Not on file    Gets together: Not on file    Attends religious service: Not on file    Active member of club or organization: Not on file    Attends meetings of clubs or organizations: Not on file    Relationship status: Not on file  . Intimate partner violence:    Fear of current or ex partner: Not on file    Emotionally  abused: Not on file    Physically abused: Not on file    Forced sexual activity: Not on file  Other Topics Concern  . Not on file  Social History Narrative  . Not on file    Allergies  Allergen Reactions  . Morphine And Related Nausea And Vomiting    "makes me sick at high doses"  . Prednisone     Causes insomnia by the 3rd day of taking.prednisone taper.    Family History  Problem Relation Age of Onset  . Arthritis Mother     Prior to Admission medications   Medication Sig Start Date End Date Taking? Authorizing Provider  albuterol (PROAIR HFA) 108 (90 BASE) MCG/ACT inhaler Inhale 2 puffs into the lungs every 6 (six) hours as needed for wheezing or shortness of breath.    Yes [provider]  albuterol (PROVENTIL) (2.5 MG/3ML) 0.083% nebulizer solution Take 2.5 mg by nebulization every 4 (four) hours as needed for wheezing or shortness of breath. Reported on 10/22/2015   Yes [provider]  atenolol (TENORMIN) 25 MG tablet Take 12.5 mg by mouth daily. 1/2 tablet  02/26/18  Yes [provider]  budesonide-formoterol (SYMBICORT) 160-4.5 MCG/ACT inhaler Inhale 2 puffs into the lungs 2 (two) times daily as needed (SOB).    Yes [provider]  calcium citrate (CALCITRATE - DOSED IN MG ELEMENTAL CALCIUM) 950 MG tablet Take 200 mg of elemental calcium by mouth daily.   Yes [provider]  cyclobenzaprine (FLEXERIL) 10 MG tablet Take 1 tablet (10 mg total) by mouth at bedtime as needed. For spasms 07/22/13  Yes Short, Noah Delaine, MD  doxycycline (VIBRA-TABS) 100 MG tablet Take 1 tablet (100 mg total) by mouth 2 (two) times daily. 03/26/18  Yes Fenton Foy, NP  escitalopram (LEXAPRO) 5 MG tablet Take 5 mg by mouth daily.   Yes [provider]  FOLIC ACID PO Take 2 tablets by mouth daily.    Yes [provider]  gabapentin (NEURONTIN) 300 MG capsule Take 1 capsule by mouth at bedtime as needed (leg pain).  03/25/18  Yes [provider]  methotrexate 250 MG/10ML injection Inject 17.5 mg into the vein once. Injects 0.7 ml subcutaneously every Wed (in stomach).   Yes [provider]  metoprolol succinate (TOPROL XL) 25 MG 24 hr tablet Take 1 tablet (25 mg total) by mouth daily. Patient taking differently: Take 12.5 mg by mouth daily.  03/22/18  Yes Nahser, Wonda Cheng, MD  oxyCODONE-acetaminophen (PERCOCET/ROXICET) 5-325 MG tablet Take by mouth. 05/11/18  Yes [provider]  predniSONE (DELTASONE) 10 MG tablet Take 3 tabs for 2 days, then 2 tabs for 2 days, then 1 tab for 2 days, then stop 03/26/18  Yes Lazaro Arms S, NP  RESTASIS 0.05 % ophthalmic emulsion Place 2 drops into both eyes daily. 09/24/15  Yes [provider]  zoledronic acid (RECLAST) 5 MG/100ML SOLN injection Inject into the vein. 05/11/18  Yes [provider]    Physical Exam: Vitals:   05/24/18 1542 05/24/18 1545 05/24/18 1648 05/24/18 1709  BP:    104/70  Pulse:  95 (!) 126 (!) 109  Resp:  20 (!) 21 16  Temp:    98.7 F (37.1 C)  TempSrc:    Oral  SpO2: 100% 100% 97% 98%     General:  Appears calm and comfortable and is NAD Eyes:  PERRL, EOMI, normal lids, iris ENT:  grossly normal hearing, lips & tongue, mmm; appropriate dentition Neck:  no LAD, masses or thyromegaly Cardiovascular:  RRR, no m/r/g. No LE edema.  Respiratory:   Decreased air movement R>L without apparently increased WOB.  Mild diffuse wheezing. Abdomen:  soft, NT, ND, NABS Back:   normal alignment, no CVAT Skin:  no rash or induration seen on limited exam Musculoskeletal:  grossly normal tone BUE/BLE, good ROM, no bony abnormality Psychiatric:  grossly normal mood and affect, speech fluent and appropriate, AOx3 Neurologic:  CN 2-12 grossly intact, moves all extremities in coordinated fashion, sensation intact    Radiological Exams on Admission: Dg Chest 2 View  Result Date: 05/24/2018 CLINICAL DATA:  68 year old female with  shortness of breath for 10 days. Recently on antibiotics and steroids without improvement. On oxygen. Decreased lung sounds. EXAM: CHEST - 2 VIEW COMPARISON:  02/20/2018 and earlier. FINDINGS: Moderate to severe chronic thoracolumbar scoliosis, dextroconvex in the thoracic spine. Mediastinal contours remain within normal limits. Stable lung volumes with no pneumothorax, pulmonary edema, pleural effusion or acute pulmonary opacity. Chronic linear scarring in the right lower lobe. Visualized tracheal air column is within normal limits. No acute osseous abnormality identified. Negative visible bowel gas pattern. IMPRESSION: No acute cardiopulmonary abnormality. Advanced scoliosis again noted. Electronically Signed   By: Genevie Ann M.D.   On: 05/24/2018 12:46    EKG: Independently reviewed.  NSR with rate 74; no evidence of acute ischemia   Labs on Admission: I have personally reviewed the available labs and imaging studies at the time of the admission.  Pertinent labs:   VBG: 7.498/31.9/19.0 K+ 3.2 CO2 21 Troponin 0.00 Essentially normal CBC  Assessment/Plan Principal Problem:   COPD exacerbation (HCC) Active Problems:   Essential hypertension   Dementia without behavioral disturbance (HCC)   CKD (chronic kidney disease), stage II   COPD exacerbation -Patient's shortness of breath and chest tightness are most likely caused by acute COPD exacerbation.  -She was seen by pulm on 11/4 and given doxy and a prednisone taper -Since then, she was also given Levaquin and Doxy with prednisone -She was seen today at pulm and had tachypnea and bronchospasm without hypoxia -She was given Duoneb and DepoMedrol 80 mg IM and sent via EMS to the ER -She does not have fever or leukocytosis.  -Chest x-ray is not consistent with pneumonia -She was given a continuous neb treatment in the ED with some improvement.  -will observe patinet -Nebulizers: scheduled Duoneb and prn albuterol -Solu-Medrol 60 mg IV BID  x 2 and then PO prednisone -IV Rocephin -Continue home Symbicort (formulary substitution to [facility]) but  consistent BID use rather than prn  HTN -Continue Toprol XL  Dementia -Takes Lexapro but no memory medications  Stage 2 CKD -Appears to be stable at this time -Recheck BMP in AM    DVT prophylaxis: Lovenox  Code Status:  Full - confirmed with patient Family Communication: None present Disposition Plan:  Home once clinically improved Consults called: CM/PT/OT/Nutrition/RT  Admission status: It is my clinical opinion that referral for OBSERVATION is reasonable and necessary in this patient based on the above information provided. The aforementioned taken together are felt to place the patient at high risk for further clinical deterioration. However it is anticipated that the patient may be medically stable for discharge from the hospital within 24 to 48 hours.    Karmen Bongo MD [facility]  If note is complete, please contact covering daytime or nighttime physician. [email or web address] Password [facility]  05/24/2018, 5:26 PM

## 2018-05-24 NOTE — ED Notes (Signed)
Lab results reported to Nurse [facility].

## 2018-05-24 NOTE — ED Triage Notes (Signed)
Pt arrived via [facility]; pt from home with c/o SOB x 10 days; pt has been to PCP twice and advised to be admitted to hospital. Pt has been on Levaquin, Doxy, Solumedrol, and neb txs without relief; pt with decreased lung sounds per EMS and working to breath; 132/79; 123 CBG; 97.7; 28 R; pt has 18G LAC

## 2018-05-24 NOTE — ED Notes (Signed)
Attempted report 

## 2018-05-25 ENCOUNTER — Observation Stay (HOSPITAL_COMMUNITY): Payer: PPO

## 2018-05-25 DIAGNOSIS — N182 Chronic kidney disease, stage 2 (mild): Secondary | ICD-10-CM | POA: Diagnosis not present

## 2018-05-25 DIAGNOSIS — J441 Chronic obstructive pulmonary disease with (acute) exacerbation: Secondary | ICD-10-CM | POA: Diagnosis not present

## 2018-05-25 DIAGNOSIS — R0602 Shortness of breath: Secondary | ICD-10-CM | POA: Diagnosis not present

## 2018-05-25 DIAGNOSIS — I10 Essential (primary) hypertension: Secondary | ICD-10-CM | POA: Diagnosis not present

## 2018-05-25 DIAGNOSIS — F0390 Unspecified dementia without behavioral disturbance: Secondary | ICD-10-CM | POA: Diagnosis not present

## 2018-05-25 LAB — BASIC METABOLIC PANEL
Anion gap: 12 (ref 5–15)
BUN: 11 mg/dL (ref 8–23)
CO2: 22 mmol/L (ref 22–32)
Calcium: 8.4 mg/dL — ABNORMAL LOW (ref 8.9–10.3)
Chloride: 102 mmol/L (ref 98–111)
Creatinine, Ser: 0.74 mg/dL (ref 0.44–1.00)
GFR calc Af Amer: >60 mL/min (ref >60)
GFR calc non Af Amer: >60 mL/min (ref >60)
Glucose, Bld: 131 mg/dL — ABNORMAL HIGH (ref 70–99)
Potassium: 4.2 mmol/L (ref 3.5–5.1)
Sodium: 136 mmol/L (ref 135–145)

## 2018-05-25 LAB — CBC WITH DIFFERENTIAL/PLATELET
Abs Immature Granulocytes: 0.03 10*3/uL (ref 0.00–0.07)
Basophils Absolute: 0 10*3/uL (ref 0.0–0.1)
Basophils Relative: 0 %
Eosinophils Absolute: 0 10*3/uL (ref 0.0–0.5)
Eosinophils Relative: 0 %
HCT: 38.5 % (ref 36.0–46.0)
Hemoglobin: 12.8 g/dL (ref 12.0–15.0)
Immature Granulocytes: 1 %
Lymphocytes Relative: 4 %
Lymphs Abs: 0.3 10*3/uL — ABNORMAL LOW (ref 0.7–4.0)
MCH: 32.7 pg (ref 26.0–34.0)
MCHC: 33.2 g/dL (ref 30.0–36.0)
MCV: 98.2 fL (ref 80.0–100.0)
MONO ABS: 0.1 10*3/uL (ref 0.1–1.0)
Monocytes Relative: 1 %
Neutro Abs: 5.5 10*3/uL (ref 1.7–7.7)
Neutrophils Relative %: 94 %
Platelets: 388 10*3/uL (ref 150–400)
RBC: 3.92 MIL/uL (ref 3.87–5.11)
RDW: 14.7 % (ref 11.5–15.5)
WBC: 5.8 10*3/uL (ref 4.0–10.5)
nRBC: 0 % (ref 0.0–0.2)

## 2018-05-25 LAB — BLOOD GAS, ARTERIAL
Acid-base deficit: 1.5 mmol/L (ref 0.0–2.0)
Bicarbonate: 20.6 mmol/L (ref 20.0–28.0)
Drawn by: 347191
O2 Content: 1 L/min
O2 Saturation: 98 %
Patient temperature: 98.6
pCO2 arterial: 23.1 mmHg — ABNORMAL LOW (ref 32.0–48.0)
pH, Arterial: 7.558 — ABNORMAL HIGH (ref 7.350–7.450)
pO2, Arterial: 87.4 mmHg (ref 83.0–108.0)

## 2018-05-25 LAB — HIV ANTIBODY (ROUTINE TESTING W REFLEX): HIV Screen 4th Generation wRfx: NONREACTIVE

## 2018-05-25 MED ORDER — LORAZEPAM 2 MG/ML IJ SOLN
0.5000 mg | Freq: Once | INTRAMUSCULAR | Status: AC
  Administered 2018-05-25: 0.5 mg via INTRAVENOUS
  Filled 2018-05-25 (×2): qty 1

## 2018-05-25 MED ORDER — IPRATROPIUM-ALBUTEROL 0.5-2.5 (3) MG/3ML IN SOLN: 3.0000 mL | Freq: Once | RESPIRATORY_TRACT | Status: DC

## 2018-05-25 MED ORDER — ALBUTEROL SULFATE (2.5 MG/3ML) 0.083% IN NEBU: 2.5000 mg | INHALATION_SOLUTION | RESPIRATORY_TRACT | Status: DC | PRN

## 2018-05-25 MED ORDER — IPRATROPIUM-ALBUTEROL 0.5-2.5 (3) MG/3ML IN SOLN
3.0000 mL | Freq: Four times a day (QID) | RESPIRATORY_TRACT | Status: DC
  Administered 2018-05-25 – 2018-05-27 (×8): 3 mL via RESPIRATORY_TRACT
  Filled 2018-05-25 (×8): qty 3

## 2018-05-25 MED ORDER — LEVALBUTEROL HCL 1.25 MG/0.5ML IN NEBU: 1.2500 mg | INHALATION_SOLUTION | Freq: Four times a day (QID) | RESPIRATORY_TRACT | Status: DC | PRN

## 2018-05-25 MED ORDER — PREDNISONE 20 MG PO TABS
40.0000 mg | ORAL_TABLET | Freq: Every day | ORAL | Status: DC
  Administered 2018-05-25 – 2018-05-27 (×3): 40 mg via ORAL
  Filled 2018-05-25 (×3): qty 2

## 2018-05-25 NOTE — Evaluation (Addendum)
Occupational Therapy Evaluation Patient Details Name: Toni Myers MRN: [id number] DOB: 11/19/1950 Today's Date: 05/25/2018    History of Present Illness Toni Myers is a 68 y.o. female with medical history significant of Sjogren's syndrome; RLS; HTN; depression; dementia; COPD; and strage 3 CKD presenting with SOB.  She has COPD, has never smoked.  She has about 2 episodes of year and needs hospitlaization every few years.  Doctors tried to admit her on 12/23 and 12/30.  She knew she was going to be admitted today and he sent her by ambulance to the hospital.  She has had extreme tightness and SOB   Clinical Impression   Pt admitted with above diagnoses, cardiopulmonary status limiting pt ability to engage in BADL/IADL at desired level of functional independence. PTA pt is independent, lives with son currently, and completes all BADL/IADL. At time of eval pt is completing BADL at baseline level, with c/o of SOB with exertion. Upon evaluation pt was dressed in own clothing, has been going to BR ind. Pt is completing all functional t/fs and bed mobility at mod I level. Focused session on navigating household distances, energy conservation, and home safety. Continued education on pursed lip breathing, pt mentions being familiar with this strategy. Further educated on pacing, prioritizing and modifying home/community outings for optimal energy- pt eagerly in agreement. Household distance completed in hallway, VSS on RA- pt with minimal complaints of SOB. Encouraged pt to remain active, pt states she likes to walk 1-2 miles daily. No further OT needs identified, OT will sign off this date.    Follow Up Recommendations  No OT follow up    Equipment Recommendations  None recommended by OT    Recommendations for Other Services       Precautions / Restrictions Precautions Precautions: Fall Restrictions Weight Bearing Restrictions: No      Mobility Bed Mobility Overal bed mobility: Modified  Independent                Transfers Overall transfer level: Modified independent Equipment used: None             General transfer comment: no LOB    Balance Overall balance assessment: No apparent balance deficits (not formally assessed)                                         ADL either performed or assessed with clinical judgement   ADL Overall ADL's : Needs assistance/impaired Eating/Feeding: Independent Eating/Feeding Details (indicate cue type and reason): for lunch brought at end of session Grooming: Modified independent   Upper Body Bathing: Set up;Sitting;Standing   Lower Body Bathing: Set up;Sit to/from stand   Upper Body Dressing : Modified independent Upper Body Dressing Details (indicate cue type and reason): dressed in own clothes at eval Lower Body Dressing: Modified independent   Toilet Transfer: Supervision/safety   Toileting- Clothing Manipulation and Hygiene: Modified independent   Tub/ Shower Transfer: Supervision/safety   Functional mobility during ADLs: Supervision/safety General ADL Comments: Pt completing BADL at baseline, focused session on energy conservation techniques and navigating BADL environment.      Vision Baseline Vision/History: Wears glasses Wears Glasses: At all times Patient Visual Report: No change from baseline       Perception     Praxis      Pertinent Vitals/Pain Pain Assessment: No/denies pain     Hand Dominance Right  Extremity/Trunk Assessment Upper Extremity Assessment Upper Extremity Assessment: Overall WFL for tasks assessed   Lower Extremity Assessment Lower Extremity Assessment: Defer to PT evaluation       Communication Communication Communication: No difficulties   Cognition Arousal/Alertness: Awake/alert Behavior During Therapy: WFL for tasks assessed/performed Overall Cognitive Status: Within Functional Limits for tasks assessed                                  General Comments: mentions of dementia in chart review, no cognitive deficits noted during eval   General Comments       Exercises     Shoulder Instructions      Home Living Family/patient expects to be discharged to:: Private residence Living Arrangements: Alone;Other (Comment)(son is currently staying with her) Available Help at Discharge: Family;Available PRN/intermittently Type of Home: House Home Access: Stairs to enter [employer] of Steps: 4 from garage entrance Entrance Stairs-Rails: Right;Left;Can reach both Home Layout: One level     Bathroom Shower/Tub: Teacher, early years/pre: Standard     Home Equipment: None          Prior Functioning/Environment Level of Independence: Independent        Comments: ind in all BADL/IADL, drives        OT Problem List: Decreased activity tolerance;Cardiopulmonary status limiting activity      OT Treatment/Interventions:      OT Goals(Current goals can be found in the care plan section) Acute Rehab OT Goals Patient Stated Goal: "to get this figured out and go home" OT Goal Formulation: With patient Time For Goal Achievement: 06/08/18 Potential to Achieve Goals: Good  OT Frequency:     Barriers to D/C:            Co-evaluation              AM-PAC OT "6 Clicks" Daily Activity     Outcome Measure Help from another person eating meals?: None Help from another person taking care of personal grooming?: None Help from another person toileting, which includes using toliet, bedpan, or urinal?: None Help from another person bathing (including washing, rinsing, drying)?: None Help from another person to put on and taking off regular upper body clothing?: None Help from another person to put on and taking off regular lower body clothing?: None 6 Click Score: 24   End of Session Equipment Utilized During Treatment: Gait belt  Activity Tolerance: Patient tolerated treatment  well Patient left: in bed;with call bell/phone within reach  OT Visit Diagnosis: Other (comment);Other abnormalities of gait and mobility (R26.89)(cardiopulmonary status)                Time: 7412-8786 OT Time Calculation (min): 18 min Charges:  OT General Charges $OT Visit: 1 Visit OT Evaluation $OT Eval Low Complexity: 1 Low  Toni Myers, MSOT, OTR/L Behavioral Health OT/ Acute Relief OT [facility] Office: [place] 05/25/2018, 12:59 PM

## 2018-05-25 NOTE — Evaluation (Signed)
Physical Therapy Evaluation Patient Details Name: Toni Myers MRN: [id number] DOB: 11-Oct-1950 Today's Date: 05/25/2018   History of Present Illness  Pt is a 68 y/o female with PMH significant of Sjogren's syndrome; RLS; HTN; depression; dementia; COPD; and stage 3 CKD presenting with SOB. Doctors tried to admit her on 12/23 and 12/30.  She knew she was going to be admitted today and MD sent her by ambulance from office to the hospital.   Clinical Impression  Pt admitted with above diagnosis. Pt currently with functional limitations due to the deficits listed below (see PT Problem List). At the time of PT eval pt was able to perform transfers with modified independence, but required up to min assist as pt fatigued during gait training. Pt on RA throughout session and during OOB mobility, sats decreased to 88-90%; able to be improved to >95% with pursed-lip breathing. Pt reports she is not at baseline of function - feel a pulmonary rehab program would be helpful to guide exercise progression and help maximize functional independence and return to PLOF. Acutely, pt will benefit from skilled PT to increase their independence and safety with mobility to allow discharge to the venue listed below.       Follow Up Recommendations Supervision - Intermittent;Other (comment)(Pulmonary rehab)    Equipment Recommendations  None recommended by PT    Recommendations for Other Services       Precautions / Restrictions Precautions Precautions: Fall Restrictions Weight Bearing Restrictions: No      Mobility  Bed Mobility Overal bed mobility: Modified Independent                Transfers Overall transfer level: Modified independent Equipment used: None             General transfer comment: no LOB  Ambulation/Gait Ambulation/Gait assistance: Supervision;Min assist Gait Distance (Feet): 200 Feet Assistive device: 1 person hand held assist;None Gait Pattern/deviations: Step-through  pattern;Decreased stride length;Drifts right/left Gait velocity: Decreased Gait velocity interpretation: 1.31 - 2.62 ft/sec, indicative of limited community ambulator General Gait Details: Slow and guarded. Pt initially without UE support and occasionally reaching out for external support to maintain balance. As pt fatigued, balance decreased and HHA provided. Pt appeared slightly dyspneic towards end of gait training and O2 sats 88-90% on RA. With pursed-lip breathing, sats increased to >95%.  Stairs            Wheelchair Mobility    Modified Rankin (Stroke Patients Only)       Balance Overall balance assessment: Mild deficits observed, not formally tested                                           Pertinent Vitals/Pain Pain Assessment: No/denies pain    Home Living Family/patient expects to be discharged to:: Private residence Living Arrangements: Alone;Other (Comment)(son is currently staying with her) Available Help at Discharge: Family;Available PRN/intermittently Type of Home: House Home Access: Stairs to enter Entrance Stairs-Rails: Right;Left;Can reach both Entrance Stairs-Number of Steps: 4 from garage entrance Home Layout: One level Home Equipment: None      Prior Function Level of Independence: Independent         Comments: ind in all BADL/IADL, drives     Hand Dominance   Dominant Hand: Right    Extremity/Trunk Assessment   Upper Extremity Assessment Upper Extremity Assessment: Overall WFL for tasks assessed  Lower Extremity Assessment Lower Extremity Assessment: Overall WFL for tasks assessed    Cervical / Trunk Assessment Cervical / Trunk Assessment: Other exceptions Cervical / Trunk Exceptions: Forward head posture with rounded shoulders  Communication   Communication: No difficulties  Cognition Arousal/Alertness: Awake/alert Behavior During Therapy: WFL for tasks assessed/performed Overall Cognitive Status: Within  Functional Limits for tasks assessed                                 General Comments: mentions of dementia in chart review, no cognitive deficits noted during eval      General Comments      Exercises     Assessment/Plan    PT Assessment Patient needs continued PT services  PT Problem List Decreased strength;Decreased activity tolerance;Decreased balance;Decreased mobility;Decreased knowledge of use of DME;Decreased safety awareness;Decreased knowledge of precautions;Cardiopulmonary status limiting activity       PT Treatment Interventions DME instruction;Gait training;Stair training;Functional mobility training;Therapeutic activities;Therapeutic exercise;Neuromuscular re-education;Patient/family education    PT Goals (Current goals can be found in the Care Plan section)  Acute Rehab PT Goals Patient Stated Goal: "to get this figured out and go home" PT Goal Formulation: With patient Time For Goal Achievement: 06/01/18 Potential to Achieve Goals: Good    Frequency Min 3X/week   Barriers to discharge        Co-evaluation               AM-PAC PT "6 Clicks" Mobility  Outcome Measure Help needed turning from your back to your side while in a flat bed without using bedrails?: None Help needed moving from lying on your back to sitting on the side of a flat bed without using bedrails?: None Help needed moving to and from a bed to a chair (including a wheelchair)?: None Help needed standing up from a chair using your arms (e.g., wheelchair or bedside chair)?: None Help needed to walk in hospital room?: None Help needed climbing 3-5 steps with a railing? : A Little 6 Click Score: 23    End of Session Equipment Utilized During Treatment: Gait belt Activity Tolerance: Patient tolerated treatment well Patient left: in bed;with call bell/phone within reach Nurse Communication: Mobility status PT Visit Diagnosis: Unsteadiness on feet (R26.81);Difficulty in  walking, not elsewhere classified (R26.2)    Time: 1610-9604 PT Time Calculation (min) (ACUTE ONLY): 19 min   Charges:   PT Evaluation $PT Eval Moderate Complexity: 1 Mod          Rolinda Roan, PT, DPT Acute Rehabilitation Services Pager: [phone] Office: [phone]   Thelma Comp 05/25/2018, 1:43 PM

## 2018-05-25 NOTE — Progress Notes (Signed)
Progress Note    Toni Myers  LGX:[id number] DOB: [birth year]  DOA: 05/24/2018 PCP: Cyndi Bender, PA-C    Brief Narrative:     Medical records reviewed and are as summarized below:  Toni Myers is an 68 y.o. female with medical history significant of Sjogren's syndrome; RLS; HTN; depression; dementia; COPD; and strage 3 CKD presenting with SOB.  She has COPD, has never smoked.  She has about 2 episodes of year and needs hospitlaization every few years.  Doctors tried to admit her on 12/23 and 12/30.  She knew she was going to be admitted today and he sent her by ambulance to the hospital.  She has had extreme tightness and SOB.  She has been using Symbicort and nebs at home without relief.  On 12/23, she was given Levaquin x 7 days and prednisone taper.  This last week, she was given doxy BID and prednisone.  "Low-grade fever".  Assessment/Plan:   Principal Problem:   COPD exacerbation ([place]) Active Problems:   Essential hypertension   Dementia without behavioral disturbance (HCC)   CKD (chronic kidney disease), stage II  COPD exacerbation -shortness of breath and chest tightness are most likely caused by acute COPD exacerbation.  -She was seen by pulm on 11/4 and given doxy and a prednisone taper -Since then, she was also given Levaquin and Doxy with prednisone -She was seen today at pulm and had tachypnea and bronchospasm without hypoxia -She was given Duoneb and DepoMedrol 80 mg IM and sent via EMS to the ER -She does not have fever or leukocytosis.  -Chest x-ray is not consistent with pneumonia -She was given a continuous neb treatment in the ED with some improvement.  - scheduled Duoneb and prn xopenex -Solu-Medrol 60 mg IV BID x 2 and then PO prednisone -IV Rocephin -Continue home Symbicort (formulary substitution to [facility]) but consistent BID use rather than prn -wean off O2 to RA- check home O2 study -thinks her son's dog may be contributing to her breathing issues  (sheds hair)  HTN -Continue Toprol XL  Stage 2 CKD -Appears to be stable at this time      Family Communication/Anticipated D/C date and plan/Code Status   DVT prophylaxis: Lovenox ordered. Code Status: Full Code.  Family Communication:  Disposition Plan: if continues to improve, may be able to d/c in AM   Medical Consultants:    None.     Subjective:   Anxious appearing  Objective:    Vitals:   05/24/18 1936 05/25/18 0327 05/25/18 0720 05/25/18 0731  BP: 104/70 112/74 118/78   Pulse: (!) 109 72 85   Resp: 16 16 (!) 22   Temp: 98.7 F (37.1 C) 97.8 F (36.6 C) 97.7 F (36.5 C)   TempSrc: Oral Oral Oral   SpO2:   98% 95%  Weight: 53.6 kg     Height: 5\' 4"  (1.626 m)       Intake/Output Summary (Last 24 hours) at 05/25/2018 1131 Last data filed at 05/25/2018 0300 Gross per 24 hour  Intake 150 ml  Output -  Net 150 ml   Filed Weights   05/24/18 1936  Weight: 53.6 kg    Exam: In bed- talking on phone w/o much work of breathing rrr Diminished breath sounds with wheezing occasionally No LE edema A+Ox3   Data Reviewed:   I have personally reviewed following labs and imaging studies:  Labs: Labs show the following:   Basic Metabolic Panel: Recent Labs  Lab 05/24/18 1432 05/25/18 0411  NA 142 136  K 3.2* 4.2  CL 106 102  CO2 21* 22  GLUCOSE 102* 131*  BUN 14 11  CREATININE 0.79 0.74  CALCIUM 9.0 8.4*   GFR Estimated Creatinine Clearance: 56.2 mL/min (by C-G formula based on SCr of 0.74 mg/dL). Liver Function Tests: No results for input(s): AST, ALT, ALKPHOS, BILITOT, PROT, ALBUMIN in the last 168 hours. No results for input(s): LIPASE, AMYLASE in the last 168 hours. No results for input(s): AMMONIA in the last 168 hours. Coagulation profile No results for input(s): INR, PROTIME in the last 168 hours.  CBC: Recent Labs  Lab 05/24/18 1432 05/25/18 0411  WBC 6.3 5.8  NEUTROABS 4.1 5.5  HGB 13.4 12.8  HCT 40.4 38.5  MCV  98.3 98.2  PLT 441* 388   Cardiac Enzymes: No results for input(s): CKTOTAL, CKMB, CKMBINDEX, TROPONINI in the last 168 hours. BNP (last 3 results) No results for input(s): PROBNP in the last 8760 hours. CBG: No results for input(s): GLUCAP in the last 168 hours. D-Dimer: No results for input(s): DDIMER in the last 72 hours. Hgb A1c: No results for input(s): HGBA1C in the last 72 hours. Lipid Profile: No results for input(s): CHOL, HDL, LDLCALC, TRIG, CHOLHDL, LDLDIRECT in the last 72 hours. Thyroid function studies: No results for input(s): TSH, T4TOTAL, T3FREE, THYROIDAB in the last 72 hours.  Invalid input(s): FREET3 Anemia work up: No results for input(s): VITAMINB12, FOLATE, FERRITIN, TIBC, IRON, RETICCTPCT in the last 72 hours. Sepsis Labs: Recent Labs  Lab 05/24/18 1432 05/25/18 0411  WBC 6.3 5.8    Microbiology No results found for this or any previous visit (from the past 240 hour(s)).  Procedures and diagnostic studies:  Dg Chest 2 View  Result Date: 05/24/2018 CLINICAL DATA:  67 year old female with shortness of breath for 10 days. Recently on antibiotics and steroids without improvement. On oxygen. Decreased lung sounds. EXAM: CHEST - 2 VIEW COMPARISON:  02/20/2018 and earlier. FINDINGS: Moderate to severe chronic thoracolumbar scoliosis, dextroconvex in the thoracic spine. Mediastinal contours remain within normal limits. Stable lung volumes with no pneumothorax, pulmonary edema, pleural effusion or acute pulmonary opacity. Chronic linear scarring in the right lower lobe. Visualized tracheal air column is within normal limits. No acute osseous abnormality identified. Negative visible bowel gas pattern. IMPRESSION: No acute cardiopulmonary abnormality. Advanced scoliosis again noted. Electronically Signed   By: Genevie Ann M.D.   On: 05/24/2018 12:46   Dg Chest Port 1 View  Result Date: 05/25/2018 CLINICAL DATA:  Acute onset of shortness of breath. EXAM: PORTABLE CHEST 1  VIEW COMPARISON:  Chest radiograph performed 05/24/2018 FINDINGS: The lungs are well-aerated. Mild bibasilar atelectasis or scarring is noted. There is no evidence of pleural effusion or pneumothorax. The cardiomediastinal silhouette is within normal limits. No acute osseous abnormalities are seen. Right convex thoracic scoliosis is noted. IMPRESSION: 1. Mild bibasilar atelectasis or scarring noted; lungs otherwise clear. 2. Right convex thoracic scoliosis noted. Electronically Signed   By: Garald Balding M.D.   On: 05/25/2018 06:24    Medications:   . cycloSPORINE  2 drop Both Eyes Daily  . enoxaparin (LOVENOX) injection  40 mg Subcutaneous Q24H  . escitalopram  5 mg Oral Daily  . ipratropium-albuterol  3 mL Nebulization Once  . ipratropium-albuterol  3 mL Nebulization Q6H  . metoprolol succinate  12.5 mg Oral BID  . mometasone-formoterol  2 puff Inhalation BID  . predniSONE  40 mg Oral Q breakfast  Continuous Infusions: . cefTRIAXone (ROCEPHIN)  IV 1 g (05/24/18 1818)     LOS: 0 days   Geradine Girt  [facility]   *Please refer to [email or web address], password [email or web address] to get updated schedule on who will round on this patient, as hospitalists switch teams weekly. If 7PM-7AM, please contact night-coverage at [email or web address], password [email or web address] for any overnight needs.  05/25/2018, 11:31 AM

## 2018-05-25 NOTE — Care Management Obs Status (Signed)
[place] NOTIFICATION   Patient Details  Name: Toni Myers MRN: [id number] Date of Birth: 09/09/1950   Medicare Observation Status Notification Given:  Yes    Midge Minium RN, BSN, NCM-BC, ACM-RN [phone] 05/25/2018, 4:24 PM

## 2018-05-26 DIAGNOSIS — J441 Chronic obstructive pulmonary disease with (acute) exacerbation: Secondary | ICD-10-CM | POA: Diagnosis not present

## 2018-05-26 DIAGNOSIS — I10 Essential (primary) hypertension: Secondary | ICD-10-CM | POA: Diagnosis not present

## 2018-05-26 DIAGNOSIS — N182 Chronic kidney disease, stage 2 (mild): Secondary | ICD-10-CM | POA: Diagnosis not present

## 2018-05-26 DIAGNOSIS — F0390 Unspecified dementia without behavioral disturbance: Secondary | ICD-10-CM | POA: Diagnosis not present

## 2018-05-26 DIAGNOSIS — R0602 Shortness of breath: Secondary | ICD-10-CM | POA: Diagnosis not present

## 2018-05-26 MED ORDER — GUAIFENESIN ER 600 MG PO TB12
600.0000 mg | ORAL_TABLET | Freq: Two times a day (BID) | ORAL | Status: DC
  Administered 2018-05-26 – 2018-05-27 (×3): 600 mg via ORAL
  Filled 2018-05-26 (×3): qty 1

## 2018-05-26 NOTE — Progress Notes (Signed)
Progress Note    Toni Myers  UVJ:[id number] DOB: 12-06-1950  DOA: 05/24/2018 PCP: Cyndi Bender, PA-C    Brief Narrative:     Medical records reviewed and are as summarized below:  Toni Myers is an 68 y.o. female with medical history significant of Sjogren's syndrome; RLS; HTN; depression; dementia; COPD; and strage 3 CKD presenting with SOB.  She has COPD, has never smoked.  She has about 2 episodes of year and needs hospitlaization every few years.  Doctors tried to admit her on 12/23 and 12/30.  She knew she was going to be admitted today and he sent her by ambulance to the hospital.  She has had extreme tightness and SOB.  She has been using Symbicort and nebs at home without relief.  On 12/23, she was given Levaquin x 7 days and prednisone taper.  This last week, she was given doxy BID and prednisone.  "Low-grade fever".  Assessment/Plan:   Principal Problem:   COPD exacerbation ([place]) Active Problems:   Essential hypertension   Dementia without behavioral disturbance (HCC)   CKD (chronic kidney disease), stage II  Acute COPD exacerbation -She was seen by pulm on 11/4 and given doxy and a prednisone taper -Since then, she was also given Levaquin and Doxy with prednisone -She was seen today at pulm and had tachypnea and bronchospasm without hypoxia -She was given Duoneb and DepoMedrol 80 mg IM and sent via EMS to the ER -She does not have fever or leukocytosis.  -Chest x-ray is not consistent with pneumonia -She was given a continuous neb treatment in the ED with some improvement.  - scheduled Duoneb and prn xopenex -Solu-Medrol 60 mg IV BID x 2 and then PO prednisone -IV Rocephin -Continue home Symbicort (formulary substitution to [facility]) but consistent BID use rather than prn -wean off O2 to RA- check home O2 study again in AM -referral to pulmonary rehab -thinks her son's dog may be contributing to her breathing issues (sheds hair) -if continues to have severe SOB  may need CTA to r/o PE-- doubt  HTN -Continue Toprol XL  Stage 2 CKD -Appears to be stable at this time      Family Communication/Anticipated D/C date and plan/Code Status   DVT prophylaxis: Lovenox ordered. Code Status: Full Code.  Family Communication:  Disposition Plan: home O2 eval in AM; d/c in AM   Medical Consultants:    None.     Subjective:   Still feeling very SOB  Objective:    Vitals:   05/26/18 0009 05/26/18 0226 05/26/18 0644 05/26/18 1403  BP: 114/76  115/74 101/65  Pulse: 77 80 71 70  Resp: 18 18 16 18   Temp: 98 F (36.7 C)  98.1 F (36.7 C) 98.3 F (36.8 C)  TempSrc: Oral  Oral Oral  SpO2: 98%  96% 96%  Weight:      Height:       No intake or output data in the 24 hours ending 05/26/18 1639 Filed Weights   05/24/18 1936  Weight: 53.6 kg    Exam: In bed, NAD rrr Diminished throughout but less wheezing No LE edema   Data Reviewed:   I have personally reviewed following labs and imaging studies:  Labs: Labs show the following:   Basic Metabolic Panel: Recent Labs  Lab 05/24/18 1432 05/25/18 0411  NA 142 136  K 3.2* 4.2  CL 106 102  CO2 21* 22  GLUCOSE 102* 131*  BUN 14 11  CREATININE 0.79 0.74  CALCIUM 9.0 8.4*   GFR Estimated Creatinine Clearance: 56.2 mL/min (by C-G formula based on SCr of 0.74 mg/dL). Liver Function Tests: No results for input(s): AST, ALT, ALKPHOS, BILITOT, PROT, ALBUMIN in the last 168 hours. No results for input(s): LIPASE, AMYLASE in the last 168 hours. No results for input(s): AMMONIA in the last 168 hours. Coagulation profile No results for input(s): INR, PROTIME in the last 168 hours.  CBC: Recent Labs  Lab 05/24/18 1432 05/25/18 0411  WBC 6.3 5.8  NEUTROABS 4.1 5.5  HGB 13.4 12.8  HCT 40.4 38.5  MCV 98.3 98.2  PLT 441* 388   Cardiac Enzymes: No results for input(s): CKTOTAL, CKMB, CKMBINDEX, TROPONINI in the last 168 hours. BNP (last 3 results) No results for  input(s): PROBNP in the last 8760 hours. CBG: No results for input(s): GLUCAP in the last 168 hours. D-Dimer: No results for input(s): DDIMER in the last 72 hours. Hgb A1c: No results for input(s): HGBA1C in the last 72 hours. Lipid Profile: No results for input(s): CHOL, HDL, LDLCALC, TRIG, CHOLHDL, LDLDIRECT in the last 72 hours. Thyroid function studies: No results for input(s): TSH, T4TOTAL, T3FREE, THYROIDAB in the last 72 hours.  Invalid input(s): FREET3 Anemia work up: No results for input(s): VITAMINB12, FOLATE, FERRITIN, TIBC, IRON, RETICCTPCT in the last 72 hours. Sepsis Labs: Recent Labs  Lab 05/24/18 1432 05/25/18 0411  WBC 6.3 5.8    Microbiology No results found for this or any previous visit (from the past 240 hour(s)).  Procedures and diagnostic studies:  Dg Chest Port 1 View  Result Date: 05/25/2018 CLINICAL DATA:  Acute onset of shortness of breath. EXAM: PORTABLE CHEST 1 VIEW COMPARISON:  Chest radiograph performed 05/24/2018 FINDINGS: The lungs are well-aerated. Mild bibasilar atelectasis or scarring is noted. There is no evidence of pleural effusion or pneumothorax. The cardiomediastinal silhouette is within normal limits. No acute osseous abnormalities are seen. Right convex thoracic scoliosis is noted. IMPRESSION: 1. Mild bibasilar atelectasis or scarring noted; lungs otherwise clear. 2. Right convex thoracic scoliosis noted. Electronically Signed   By: Garald Balding M.D.   On: 05/25/2018 06:24    Medications:   . cycloSPORINE  2 drop Both Eyes Daily  . enoxaparin (LOVENOX) injection  40 mg Subcutaneous Q24H  . escitalopram  5 mg Oral Daily  . guaiFENesin  600 mg Oral BID  . ipratropium-albuterol  3 mL Nebulization Once  . ipratropium-albuterol  3 mL Nebulization Q6H  . metoprolol succinate  12.5 mg Oral BID  . mometasone-formoterol  2 puff Inhalation BID  . predniSONE  40 mg Oral Q breakfast   Continuous Infusions: . cefTRIAXone (ROCEPHIN)  IV 1 g  (05/25/18 1733)     LOS: 0 days   Geradine Girt  [facility]   *Please refer to [email or web address], password [email or web address] to get updated schedule on who will round on this patient, as hospitalists switch teams weekly. If 7PM-7AM, please contact night-coverage at [email or web address], password [email or web address] for any overnight needs.  05/26/2018, 4:39 PM

## 2018-05-26 NOTE — Progress Notes (Signed)
Resting: Sa02 - 96%, HR - 76,  Resp - 18.  Patient ambulated 600 feet.  Sa02 - 94%, HR - 94, Resp - 20    Lashan Macias, Tenny Craw, RN

## 2018-05-27 DIAGNOSIS — J441 Chronic obstructive pulmonary disease with (acute) exacerbation: Secondary | ICD-10-CM | POA: Diagnosis not present

## 2018-05-27 DIAGNOSIS — F0390 Unspecified dementia without behavioral disturbance: Secondary | ICD-10-CM | POA: Diagnosis not present

## 2018-05-27 DIAGNOSIS — I10 Essential (primary) hypertension: Secondary | ICD-10-CM | POA: Diagnosis not present

## 2018-05-27 DIAGNOSIS — N182 Chronic kidney disease, stage 2 (mild): Secondary | ICD-10-CM | POA: Diagnosis not present

## 2018-05-27 DIAGNOSIS — R0602 Shortness of breath: Secondary | ICD-10-CM | POA: Diagnosis not present

## 2018-05-27 MED ORDER — PREDNISONE 10 MG PO TABS: ORAL_TABLET | ORAL | 0 refills | Status: DC

## 2018-05-27 MED ORDER — METOPROLOL SUCCINATE ER 25 MG PO TB24: 12.5000 mg | ORAL_TABLET | Freq: Every day | ORAL | Status: DC

## 2018-05-27 MED ORDER — PANTOPRAZOLE SODIUM 40 MG PO TBEC: 40.0000 mg | DELAYED_RELEASE_TABLET | Freq: Every day | ORAL | 0 refills | Status: DC

## 2018-05-27 NOTE — Progress Notes (Signed)
Discharge instructions given to patient and her daughter over the phone. Both verbalized understanding and all questions were answered. Pt will be staying with daughter for few weeks.

## 2018-05-27 NOTE — Progress Notes (Signed)
SATURATION QUALIFICATIONS:  Patient Saturations on Room Air at Rest = 97%  Patient Saturations on Room Air while Ambulating = 99%

## 2018-05-27 NOTE — Discharge Summary (Signed)
Physician Discharge Summary  [name] EXN:[id number] DOB: 1950-12-31 DOA: 05/24/2018  PCP: Cyndi Bender, PA-C  Admit date: 05/24/2018 Discharge date: 05/27/2018  Admitted From: home Discharge disposition: home   Recommendations for Outpatient Follow-Up:   Consider referral to pulm rehab Also need formal neurologic eval as showing signs of dementia/memory issues (asking same question over and over)  Discharge Diagnosis:   Principal Problem:   COPD exacerbation ([place]) Active Problems:   Essential hypertension   Dementia without behavioral disturbance ([place])   CKD (chronic kidney disease), stage II    Discharge Condition: Improved.  Diet recommendation: Low sodium, heart healthy.  Wound care: None.  Code status: Full.   History of Present Illness:   Toni Myers is a 68 y.o. female with medical history significant of Sjogren's syndrome; RLS; HTN; depression; dementia; COPD; and strage 3 CKD presenting with SOB.  She has COPD, has never smoked.  She has about 2 episodes of year and needs hospitlaization every few years.  Doctors tried to admit her on 12/23 and 12/30.  She knew she was going to be admitted today and he sent her by ambulance to the hospital.  She has had extreme tightness and SOB.  She has been using Symbicort and nebs at home without relief.  On 12/23, she was given Levaquin x 7 days and prednisone taper.  This last week, she was given doxy BID and prednisone.  "Low-grade fever".   Hospital Course by Problem:   Acute COPD exacerbation -She was seen by pulm on 11/4 and given doxy and a prednisone taper -Since then, she was also given Levaquin and Doxy with prednisone -She was seen today at pulm and had tachypnea and bronchospasm without hypoxia -She was given Duoneb and DepoMedrol 80 mg IM and sent via EMS to the ER -She does not have fever or leukocytosis. -Chest x-ray is not consistent with pneumonia -She was given a continuous neb treatment in  the ED with some improvement.  - scheduled Duoneb and prn xopenex -Solu-Medrol60 mg IV BIDx 2 and then PO prednisone -IVRocephin -Continue home Symbicort (formulary substitution to [facility]) but consistent BID use rather than prn -O2 sats good on room air -referral to pulmonary rehab-- defer to PCP -thinks her son's dog may be contributing to her breathing issues (sheds hair)   HTN -Continue Toprol XL  Stage 2 CKD -Appears to be stable at this time       Medical Consultants:      Discharge Exam:   Vitals:   05/27/18 0510 05/27/18 0746  BP: 106/73   Pulse: 70   Resp: 17   Temp: 97.9 F (36.6 C)   SpO2: 97% 98%   Vitals:   05/27/18 0041 05/27/18 0351 05/27/18 0510 05/27/18 0746  BP: 106/72  106/73   Pulse: 69  70   Resp: 17  17   Temp: 97.6 F (36.4 C)  97.9 F (36.6 C)   TempSrc: Oral  Oral   SpO2: 100% 98% 97% 98%  Weight:      Height:        General exam: Appears calm and comfortable.-- short term memory is impaired-- also disclosed some issues with her son regarding their living conditions   The results of significant diagnostics from this hospitalization (including imaging, microbiology, ancillary and laboratory) are listed below for reference.     Procedures and Diagnostic Studies:   Dg Chest 2 View  Result Date: 05/24/2018 CLINICAL DATA:  67 year old  female with shortness of breath for 10 days. Recently on antibiotics and steroids without improvement. On oxygen. Decreased lung sounds. EXAM: CHEST - 2 VIEW COMPARISON:  02/20/2018 and earlier. FINDINGS: Moderate to severe chronic thoracolumbar scoliosis, dextroconvex in the thoracic spine. Mediastinal contours remain within normal limits. Stable lung volumes with no pneumothorax, pulmonary edema, pleural effusion or acute pulmonary opacity. Chronic linear scarring in the right lower lobe. Visualized tracheal air column is within normal limits. No acute osseous abnormality identified. Negative  visible bowel gas pattern. IMPRESSION: No acute cardiopulmonary abnormality. Advanced scoliosis again noted. Electronically Signed   By: Genevie Ann M.D.   On: 05/24/2018 12:46   Dg Chest Port 1 View  Result Date: 05/25/2018 CLINICAL DATA:  Acute onset of shortness of breath. EXAM: PORTABLE CHEST 1 VIEW COMPARISON:  Chest radiograph performed 05/24/2018 FINDINGS: The lungs are well-aerated. Mild bibasilar atelectasis or scarring is noted. There is no evidence of pleural effusion or pneumothorax. The cardiomediastinal silhouette is within normal limits. No acute osseous abnormalities are seen. Right convex thoracic scoliosis is noted. IMPRESSION: 1. Mild bibasilar atelectasis or scarring noted; lungs otherwise clear. 2. Right convex thoracic scoliosis noted. Electronically Signed   By: Garald Balding M.D.   On: 05/25/2018 06:24     Labs:   Basic Metabolic Panel: Recent Labs  Lab 05/24/18 1432 05/25/18 0411  NA 142 136  K 3.2* 4.2  CL 106 102  CO2 21* 22  GLUCOSE 102* 131*  BUN 14 11  CREATININE 0.79 0.74  CALCIUM 9.0 8.4*   GFR Estimated Creatinine Clearance: 56.2 mL/min (by C-G formula based on SCr of 0.74 mg/dL). Liver Function Tests: No results for input(s): AST, ALT, ALKPHOS, BILITOT, PROT, ALBUMIN in the last 168 hours. No results for input(s): LIPASE, AMYLASE in the last 168 hours. No results for input(s): AMMONIA in the last 168 hours. Coagulation profile No results for input(s): INR, PROTIME in the last 168 hours.  CBC: Recent Labs  Lab 05/24/18 1432 05/25/18 0411  WBC 6.3 5.8  NEUTROABS 4.1 5.5  HGB 13.4 12.8  HCT 40.4 38.5  MCV 98.3 98.2  PLT 441* 388   Cardiac Enzymes: No results for input(s): CKTOTAL, CKMB, CKMBINDEX, TROPONINI in the last 168 hours. BNP: Invalid input(s): POCBNP CBG: No results for input(s): GLUCAP in the last 168 hours. D-Dimer No results for input(s): DDIMER in the last 72 hours. Hgb A1c No results for input(s): HGBA1C in the last 72  hours. Lipid Profile No results for input(s): CHOL, HDL, LDLCALC, TRIG, CHOLHDL, LDLDIRECT in the last 72 hours. Thyroid function studies No results for input(s): TSH, T4TOTAL, T3FREE, THYROIDAB in the last 72 hours.  Invalid input(s): FREET3 Anemia work up No results for input(s): VITAMINB12, FOLATE, FERRITIN, TIBC, IRON, RETICCTPCT in the last 72 hours. Microbiology No results found for this or any previous visit (from the past 240 hour(s)).   Discharge Instructions:   Discharge Instructions    Diet general   Complete by:  As directed    Discharge instructions   Complete by:  As directed    While on steroids, please also use protonix to protect lining of stomach Avoid allergens- see attached information   Increase activity slowly   Complete by:  As directed      Allergies as of 05/27/2018      Reactions   Morphine And Related Nausea And Vomiting   "makes me sick at high doses"   Prednisone    Causes insomnia by the 3rd day  of taking.prednisone taper.      Medication List    STOP taking these medications   doxycycline 100 MG tablet Commonly known as:  VIBRA-TABS     TAKE these medications   budesonide-formoterol 160-4.5 MCG/ACT inhaler Commonly known as:  SYMBICORT Inhale 2 puffs into the lungs 2 (two) times daily as needed (SOB).   calcium citrate 950 MG tablet Commonly known as:  CALCITRATE - dosed in mg elemental calcium Take 200 mg of elemental calcium by mouth daily.   cyclobenzaprine 10 MG tablet Commonly known as:  FLEXERIL Take 1 tablet (10 mg total) by mouth at bedtime as needed. For spasms   escitalopram 5 MG tablet Commonly known as:  LEXAPRO Take 5 mg by mouth daily.   FOLIC ACID PO Take 2 tablets by mouth daily.   gabapentin 300 MG capsule Commonly known as:  NEURONTIN Take 1 capsule by mouth at bedtime as needed (leg pain).   methotrexate 250 MG/10ML injection Inject 17.5 mg into the vein once. Injects 0.7 ml subcutaneously every Wed (in  stomach).   metoprolol succinate 25 MG 24 hr tablet Commonly known as:  TOPROL XL Take 0.5 tablets (12.5 mg total) by mouth daily.   oxyCODONE-acetaminophen 5-325 MG tablet Commonly known as:  PERCOCET/ROXICET Take by mouth.   pantoprazole 40 MG tablet Commonly known as:  PROTONIX Take 1 tablet (40 mg total) by mouth daily.   predniSONE 10 MG tablet Commonly known as:  DELTASONE 40 mg x 2 days then 30 mg x2 days then 20 mg x 2 days then 10 mg x 2 days then stop What changed:  additional instructions   albuterol (2.5 MG/3ML) 0.083% nebulizer solution Commonly known as:  PROVENTIL Take 2.5 mg by nebulization every 4 (four) hours as needed for wheezing or shortness of breath. Reported on 10/22/2015   PROAIR HFA 108 (90 Base) MCG/ACT inhaler Generic drug:  albuterol Inhale 2 puffs into the lungs every 6 (six) hours as needed for wheezing or shortness of breath.   RESTASIS 0.05 % ophthalmic emulsion Generic drug:  cycloSPORINE Place 2 drops into both eyes daily.   zoledronic acid 5 MG/100ML Soln injection Commonly known as:  RECLAST Inject into the vein.      Follow-up Information    Cyndi Bender, PA-C Follow up in 1 week(s).   Specialty:  Physician Assistant Contact information: [address] [place] [address] [phone]            Time coordinating discharge: 25 min  Signed:  Geradine Girt DO  [facility] 05/27/2018, 8:58 AM

## 2018-05-29 ENCOUNTER — Ambulatory Visit (INDEPENDENT_AMBULATORY_CARE_PROVIDER_SITE_OTHER): Payer: PPO | Admitting: Emergency Medicine

## 2018-05-29 ENCOUNTER — Encounter: Payer: Self-pay | Admitting: Emergency Medicine

## 2018-05-29 DIAGNOSIS — J189 Pneumonia, unspecified organism: Secondary | ICD-10-CM

## 2018-05-29 DIAGNOSIS — J849 Interstitial pulmonary disease, unspecified: Secondary | ICD-10-CM

## 2018-05-29 DIAGNOSIS — J449 Chronic obstructive pulmonary disease, unspecified: Secondary | ICD-10-CM | POA: Diagnosis not present

## 2018-05-29 NOTE — Assessment & Plan Note (Signed)
Last noted on CT scan of the chest 2017.  I think she needs a repeat scan to look for interval stability, change especially in light of her history of methotrexate use, autoimmune disease.

## 2018-05-29 NOTE — Progress Notes (Signed)
  Subjective:    Patient ID: Toni Myers, female    DOB: 1951/03/20, 67y.o.   MRN: [id number] HPI  ROV 05/29/18 --patient presents today to reestablish care, last seen by me in 2017 but seen end of 2019 and then in our office 5 days ago for acute dyspnea, cough with sputum, wheezing.  She is 73, has a history of Sjogren's with uveitis, hypertension, depression.  I followed her for mixed restrictive and obstructive disease felt to be consistent with asthma, allergic rhinitis.  She also has some very mild centrilobular groundglass and micronodular disease by CT scan of the chest, last was 12/2015.  She required hospital admission 1/2 and was treated for COPD/asthma exacerbation with prednisone, antibiotics.  No evidence for pneumonia by chest x-ray.  Apparently she had only been using her Symbicort as needed prior to the hospitalization. She was discharge on a pred taper, still on 30mg . She is using albuterol 2-3x a day. Her breathing is better, not to baseline. She is back on Symbicort, using 2-3x a day. Minimal cough.    Exam: Vitals:   05/29/18 1620  BP: 118/66  Pulse: 74  SpO2: 100%  Weight: 119 lb (54 kg)  Height: 5\' 4"  (1.626 m)    Gen: Pleasant, well-nourished, in no distress,  normal affect, dry cough   ENT: No lesions,  mouth clear,  oropharynx clear, no postnasal drip  Neck: No JVD, no stridor  Lungs: No use of accessory muscles, no wheeze or crackles  Cardiovascular: RRR, heart sounds normal, no murmur or gallops, no peripheral edema  Musculoskeletal: No deformities, no cyanosis or clubbing  Neuro: alert, non focal  Skin: Warm, no lesions or rashes       Assessment & Plan:  COPD (chronic obstructive pulmonary disease) ([place]) Had overall been stable for over a year, then more difficulty in November and December 2019. Treated for exacerbation and admitted beginning of 2020.  Now completing a prednisone taper, feels better, not back to usual baseline.  No wheezing today on  exam.  She uses her Symbicort unreliably.  Discussed the importance of this is a maintenance medication with her today.  Differentiated this from her rescue bronchodilator.  Please continue to taper down your prednisone to 0 as planned Continue Symbicort 2 puffs twice daily.  Take this every day on a schedule twice a day.  Remember to rinse and gargle after using. Keep your albuterol available to use either 2 puffs or 1 nebulizer treatment up to every 4 hours if needed for shortness of breath, chest tightness, wheezing. Follow with Dr Lamonte Sakai in March.  Call us sooner if you have any problems.  Pneumonitis Last noted on CT scan of the chest 2017.  I think she needs a repeat scan to look for interval stability, change especially in light of her history of methotrexate use, autoimmune disease.    Baltazar Apo, MD, PhD 05/29/2018, 5:07 PM [facility] [phone] or if no answer [phone]

## 2018-05-29 NOTE — Patient Instructions (Signed)
Please continue to taper down your prednisone to 0 as planned Continue Symbicort 2 puffs twice daily.  Take this every day on a schedule twice a day.  Remember to rinse and gargle after using. Keep your albuterol available to use either 2 puffs or 1 nebulizer treatment up to every 4 hours if needed for shortness of breath, chest tightness, wheezing. We will plan to repeat your CT scan of the chest in March 2020 Follow with Dr Lamonte Sakai in March.  Call us sooner if you have any problems.

## 2018-05-29 NOTE — Assessment & Plan Note (Signed)
Had overall been stable for over a year, then more difficulty in November and December 2019. Treated for exacerbation and admitted beginning of 2020.  Now completing a prednisone taper, feels better, not back to usual baseline.  No wheezing today on exam.  She uses her Symbicort unreliably.  Discussed the importance of this is a maintenance medication with her today.  Differentiated this from her rescue bronchodilator.  Please continue to taper down your prednisone to 0 as planned Continue Symbicort 2 puffs twice daily.  Take this every day on a schedule twice a day.  Remember to rinse and gargle after using. Keep your albuterol available to use either 2 puffs or 1 nebulizer treatment up to every 4 hours if needed for shortness of breath, chest tightness, wheezing. Follow with Dr Lamonte Sakai in March.  Call us sooner if you have any problems.

## 2018-05-31 DIAGNOSIS — Z09 Encounter for follow-up examination after completed treatment for conditions other than malignant neoplasm: Secondary | ICD-10-CM | POA: Diagnosis not present

## 2018-05-31 DIAGNOSIS — Z79899 Other long term (current) drug therapy: Secondary | ICD-10-CM | POA: Diagnosis not present

## 2018-05-31 DIAGNOSIS — Z6821 Body mass index (BMI) 21.0-21.9, adult: Secondary | ICD-10-CM | POA: Diagnosis not present

## 2018-05-31 DIAGNOSIS — J439 Emphysema, unspecified: Secondary | ICD-10-CM | POA: Diagnosis not present

## 2018-05-31 DIAGNOSIS — I10 Essential (primary) hypertension: Secondary | ICD-10-CM | POA: Diagnosis not present

## 2018-05-31 DIAGNOSIS — J441 Chronic obstructive pulmonary disease with (acute) exacerbation: Secondary | ICD-10-CM | POA: Diagnosis not present

## 2018-06-01 ENCOUNTER — Encounter: Payer: Self-pay | Admitting: Cardiovascular Disease

## 2018-06-05 DIAGNOSIS — M810 Age-related osteoporosis without current pathological fracture: Secondary | ICD-10-CM | POA: Diagnosis not present

## 2018-06-14 DIAGNOSIS — M3500 Sicca syndrome, unspecified: Secondary | ICD-10-CM | POA: Diagnosis not present

## 2018-06-14 DIAGNOSIS — Z79899 Other long term (current) drug therapy: Secondary | ICD-10-CM | POA: Diagnosis not present

## 2018-06-22 ENCOUNTER — Encounter: Payer: Self-pay | Admitting: Cardiovascular Disease

## 2018-06-22 ENCOUNTER — Ambulatory Visit (INDEPENDENT_AMBULATORY_CARE_PROVIDER_SITE_OTHER): Payer: PPO | Admitting: Cardiovascular Disease

## 2018-06-22 ENCOUNTER — Telehealth: Payer: Self-pay | Admitting: Emergency Medicine

## 2018-06-22 VITALS — BP 112/64 | HR 68 | Ht 64.0 in | Wt 118.1 lb

## 2018-06-22 DIAGNOSIS — R000 Tachycardia, unspecified: Secondary | ICD-10-CM

## 2018-06-22 DIAGNOSIS — J449 Chronic obstructive pulmonary disease, unspecified: Secondary | ICD-10-CM

## 2018-06-22 NOTE — Progress Notes (Signed)
Cardiology Office Note:    Date:  06/22/2018   ID:  Toni Myers, DOB [birth year], MRN [id number]  PCP:  Toni Bender, PA-C  Cardiologist:  Toni Moores, MD  Electrophysiologist:  None   Referring MD: Toni Bender, PA-C   Chief Complaint  Patient presents with  . Tachycardia     Oct. 31, 2019   Toni Myers is a 68 y.o. female with a hx of  COPD, HTN, Lupus,   She has a history of hypertension.  She also has a history of sinus tachycardia.  We are asked to see her today by Toni Roe, PA for further evaluation of her hypertension and sinus tachycardia.  Toni Myers")  is seen back today . I've seen her in the remote past - for ? HTN She has COPD and has never smoked.   Had lots of 2nd hand smoke  She has had  Tachycardia  Was started on Atenolol and her tachycardia is better.   Seen Dr. Lamonte Myers for her COPD .   Has been losing weight . - 20 lbs  Has lots of fatigue   Jan. 31, 2020:  Toni Myers is seen back today for follow-up visit for her sinus tachycardia.  She has a history of COPD ( lifelong nonsmoker)  and hypertension. Has never smoked.   Has had COPD for years - she is not sure if she has alpha 1 antitrypsin deficiency Is doing well No caridac issues   Past Medical History:  Diagnosis Date  . Anxiety   . Arthritis    Rheumatoid  . Asthma   . Chronic iritis, left eye   . Chronic low back pain   . CKD (chronic kidney disease), stage II 10/08/2015  . Closed fracture of multiple pubic rami ([place]) 07/19/2013  . Closed fracture of pelvic rim 07/19/2013  . COPD (chronic obstructive pulmonary disease) ([place])    no o2, ToniRobert Myers   . Cough   . Dementia without behavioral disturbance ([place])   . Depression   . GERD (gastroesophageal reflux disease)   . Heart murmur   . Hypertension   . Migraine   . Palpitations   . RLS (restless legs syndrome)   . Shingles   . Sjogrens syndrome [facility])     Past Surgical History:  Procedure Laterality Date  . BACK  SURGERY     07/1998 and 03/1999, herniated disc, 2 rod and 4 screws  . CARDIAC CATHETERIZATION     shadow no blockage  . COLONOSCOPY WITH PROPOFOL N/A 02/09/2015   Procedure: COLONOSCOPY WITH PROPOFOL;  Surgeon: Toni Fair, MD;  Location: [facility] END[facility]COPY;  Service: Endoscopy;  Laterality: N/A;  . ESOPHAGOGASTRODUODENOSCOPY (EGD) WITH PROPOFOL N/A 02/09/2015   Procedure: ESOPHAGOGASTRODUODENOSCOPY (EGD) WITH PROPOFOL;  Surgeon: Toni Fair, MD;  Location: [facility] END[facility]COPY;  Service: Endoscopy;  Laterality: N/A;  . TUBAL LIGATION      Current Medications: Current Meds  Medication Sig  . albuterol (PROAIR HFA) 108 (90 BASE) MCG/ACT inhaler Inhale 2 puffs into the lungs every 6 (six) hours as needed for wheezing or shortness of breath.   Marland Kitchen albuterol (PROVENTIL) (2.5 MG/3ML) 0.083% nebulizer solution Take 2.5 mg by nebulization every 4 (four) hours as needed for wheezing or shortness of breath. Reported on 10/22/2015  . budesonide-formoterol (SYMBICORT) 160-4.5 MCG/ACT inhaler Inhale 2 puffs into the lungs 2 (two) times daily as needed (SOB).   . calcium citrate (CALCITRATE - DOSED IN MG ELEMENTAL CALCIUM) 950 MG tablet Take 200  mg of elemental calcium by mouth daily.  . cyclobenzaprine (FLEXERIL) 10 MG tablet Take 1 tablet (10 mg total) by mouth at bedtime as needed. For spasms  . escitalopram (LEXAPRO) 5 MG tablet Take 5 mg by mouth daily.  Marland Kitchen FOLIC ACID PO Take 2 tablets by mouth daily.   Marland Kitchen gabapentin (NEURONTIN) 300 MG capsule Take 1 capsule by mouth at bedtime as needed (leg pain).   . methotrexate 250 MG/10ML injection Inject 17.5 mg into the vein once. Injects 0.7 ml subcutaneously every Wed (in stomach).  . metoprolol succinate (TOPROL XL) 25 MG 24 hr tablet Take 0.5 tablets (12.5 mg total) by mouth daily.  Marland Kitchen oxyCODONE-acetaminophen (PERCOCET/ROXICET) 5-325 MG tablet Take by mouth.  . pantoprazole (PROTONIX) 40 MG tablet Take 1 tablet (40 mg total) by mouth daily.  . RESTASIS 0.05 %  ophthalmic emulsion Place 2 drops into both eyes daily.  . zoledronic acid (RECLAST) 5 MG/100ML SOLN injection Inject into the vein.     Allergies:   Morphine and related and Prednisone   Social History   Socioeconomic History  . Marital status: Divorced    Spouse name: Not on file  . Number of children: Not on file  . Years of education: Not on file  . Highest education level: Not on file  Occupational History  . Occupation: retired  Scientific laboratory technician  . Financial resource strain: Not on file  . Food insecurity:    Worry: Not on file    Inability: Not on file  . Transportation needs:    Medical: Not on file    Non-medical: Not on file  Tobacco Use  . Smoking status: Never Smoker  . Smokeless tobacco: Never Used  Substance and Sexual Activity  . Alcohol use: Yes    Comment: 2 glasses monthly  . Drug use: No  . Sexual activity: Not on file  Lifestyle  . Physical activity:    Days per week: Not on file    Minutes per session: Not on file  . Stress: Not on file  Relationships  . Social connections:    Talks on phone: Not on file    Gets together: Not on file    Attends religious service: Not on file    Active member of club or organization: Not on file    Attends meetings of clubs or organizations: Not on file    Relationship status: Not on file  Other Topics Concern  . Not on file  Social History Narrative  . Not on file     Family History: The patient's family history includes Arthritis in her mother.  ROS:   Please see the history of present illness.     All other systems reviewed and are negative.  EKGs/Labs/Other Studies Reviewed:    The following studies were reviewed today:   EKG:   March 22, 2018: Normal sinus rhythm at 71 beats minute.  Normal EKG.  Recent Labs: 05/25/2018: BUN 11; Creatinine, Ser 0.74; Hemoglobin 12.8; Platelets 388; Potassium 4.2; Sodium 136  Recent Lipid Panel No results found for: CHOL, TRIG, HDL, CHOLHDL, VLDL, LDLCALC,  LDLDIRECT   Physical Exam: Blood pressure 112/64, pulse 68, height 5\' 4"  (1.626 m), weight 118 lb 1.9 oz (53.6 kg), SpO2 95 %.  GEN:  Well nourished, well developed in no acute distress HEENT: Normal NECK: No JVD; No carotid bruits LYMPHATICS: No lymphadenopathy CARDIAC: RRR RESPIRATORY:  Clear to auscultation without rales, wheezing or rhonchi  ABDOMEN: Soft, non-tender, non-distended MUSCULOSKELETAL:  No edema; No deformity  SKIN: Warm and dry NEUROLOGIC:  Alert and oriented x 3     ASSESSMENT:    No diagnosis found. PLAN:    In order of problems listed above:  1. Sinus tachycardia:  . She is doing very well.  She is tolerating the low-dose metoprolol.  She is not having any further episodes of sinus tachycardia.  We will have her follow-up with her primary medical doctor.  I will see her on an as-needed basis.  3.  COPD :   She has never smoked.  I wonder if she has alpha 1 antitrypsin deficiency.  We will have her ask Dr. Lamonte Myers.      Medication Adjustments/Labs and Tests Ordered: Current medicines are reviewed at length with the patient today.  Concerns regarding medicines are outlined above.  No orders of the defined types were placed in this encounter.  No orders of the defined types were placed in this encounter.    There are no Patient Instructions on file for this visit.   Signed, Toni Moores, MD  06/22/2018 8:49 AM    [facility]

## 2018-06-22 NOTE — Telephone Encounter (Signed)
Patient stated that Dr. Acie Fredrickson requested that Dr. Lamonte Sakai provide an Alpha 1 antitripysin for patient at our facility//fmb

## 2018-06-22 NOTE — Telephone Encounter (Signed)
LMTCB  Lab order has been placed.

## 2018-06-22 NOTE — Patient Instructions (Addendum)
Medication Instructions:  Your physician recommends that you continue on your current medications as directed. Please refer to the Current Medication list given to you today.  If you need a refill on your cardiac medications before your next appointment, please call your pharmacy.   Lab work: None Scientist, physiological 1 antitripysin    Testing/Procedures: None Ordered   Follow-Up: At [facility], you and your health needs are our priority.  As part of our continuing mission to provide you with exceptional heart care, we have created designated Provider Care Teams.  These Care Teams include your primary Cardiologist (physician) and Advanced Practice Providers (APPs -  Physician Assistants and Nurse Practitioners) who all work together to provide you with the care you need, when you need it. You will need a follow up appointment in:   as needed.    You may see Mertie Moores, MD or one of the following Advanced Practice Providers on your designated Care Team: Richardson Dopp, PA-C [place], [place] . Daune Perch, NP

## 2018-06-22 NOTE — Telephone Encounter (Signed)
Yes please order the alpha-1 antitrypsin genotype/phenotype

## 2018-06-22 NOTE — Telephone Encounter (Signed)
Message routed to Dr. Lamonte Sakai for review.   Dr. Lamonte Sakai - Can the requested test be ordered for this patient? Thank you.

## 2018-06-25 ENCOUNTER — Encounter: Payer: Self-pay | Admitting: Cardiovascular Disease

## 2018-06-25 NOTE — Telephone Encounter (Signed)
Called and left message for Patient to call back. 

## 2018-06-27 NOTE — Telephone Encounter (Signed)
Called patient unable to reach left message to give us a call back.

## 2018-06-28 ENCOUNTER — Telehealth: Payer: Self-pay | Admitting: Emergency Medicine

## 2018-06-28 DIAGNOSIS — J449 Chronic obstructive pulmonary disease, unspecified: Secondary | ICD-10-CM

## 2018-06-28 NOTE — Telephone Encounter (Signed)
Pt is calling back [phone]

## 2018-06-28 NOTE — Telephone Encounter (Signed)
ATC Patient. Left message to call back. Per previous message from Dr Lamonte Sakai-  Lamonte Sakai, Rose Fillers, MD  to [facility] Triage Pool     2:41 PM  Note    Yes please order the alpha-1 antitrypsin genotype/phenotype

## 2018-06-28 NOTE — Telephone Encounter (Signed)
Called and spoke with patient regarding [facility] lab request for alpha 1 labs Patient is aware to come in the [place] office this week for labs Pt verbalized understanding, and had no further concerns Pt states she will come into the office 06/29/2018 for labs Nothing further needed.

## 2018-06-28 NOTE — Telephone Encounter (Signed)
We have attempted to contact pt several times with no success or call back from pt. Per triage protocol, message will be closed.   Lab order has already been entered.

## 2018-06-29 ENCOUNTER — Other Ambulatory Visit: Payer: PPO

## 2018-06-29 DIAGNOSIS — J449 Chronic obstructive pulmonary disease, unspecified: Secondary | ICD-10-CM

## 2018-07-08 LAB — ALPHA-1 ANTITRYPSIN PHENOTYPE: A-1 Antitrypsin, Ser: 174 mg/dL (ref 83–199)

## 2018-07-11 ENCOUNTER — Telehealth: Payer: Self-pay | Admitting: Emergency Medicine

## 2018-07-11 NOTE — Telephone Encounter (Signed)
Called patient at both numbers, no one answered and VM did not answer. Will call back later.

## 2018-07-12 NOTE — Telephone Encounter (Signed)
Pt returning call CB# [phone]//BBJ

## 2018-07-12 NOTE — Telephone Encounter (Signed)
ATC pt, no answer. Left message for pt to call back.  

## 2018-07-12 NOTE — Telephone Encounter (Signed)
Called and left message for Patient to call back when available.

## 2018-07-12 NOTE — Telephone Encounter (Signed)
Advised pt of results. Pt understood and nothing further is needed.    Notes recorded by Collene Gobble, MD on 07/10/2018 at 5:15 PM EST Please let the patient know that her a1-AT genotype is normal, and that her serum level is normal.

## 2018-07-12 NOTE — Telephone Encounter (Signed)
Pt returning call CB# [phone]//JNW

## 2018-07-16 DIAGNOSIS — J069 Acute upper respiratory infection, unspecified: Secondary | ICD-10-CM | POA: Diagnosis not present

## 2018-07-16 DIAGNOSIS — Z6821 Body mass index (BMI) 21.0-21.9, adult: Secondary | ICD-10-CM | POA: Diagnosis not present

## 2018-08-03 ENCOUNTER — Other Ambulatory Visit: Payer: Self-pay

## 2018-08-03 ENCOUNTER — Encounter (INDEPENDENT_AMBULATORY_CARE_PROVIDER_SITE_OTHER): Payer: Self-pay

## 2018-08-03 ENCOUNTER — Ambulatory Visit (INDEPENDENT_AMBULATORY_CARE_PROVIDER_SITE_OTHER)
Admission: RE | Admit: 2018-08-03 | Discharge: 2018-08-03 | Disposition: A | Discharge status: Home / Self Care | Payer: PPO | Source: Ambulatory Visit | Attending: Emergency Medicine | Admitting: Emergency Medicine

## 2018-08-03 DIAGNOSIS — J984 Other disorders of lung: Secondary | ICD-10-CM | POA: Diagnosis not present

## 2018-08-03 DIAGNOSIS — J849 Interstitial pulmonary disease, unspecified: Secondary | ICD-10-CM | POA: Diagnosis not present

## 2018-08-06 ENCOUNTER — Other Ambulatory Visit: Payer: Self-pay

## 2018-08-06 ENCOUNTER — Encounter: Payer: Self-pay | Admitting: Emergency Medicine

## 2018-08-06 ENCOUNTER — Ambulatory Visit (INDEPENDENT_AMBULATORY_CARE_PROVIDER_SITE_OTHER): Payer: PPO | Admitting: Emergency Medicine

## 2018-08-06 DIAGNOSIS — M3500 Sicca syndrome, unspecified: Secondary | ICD-10-CM | POA: Diagnosis not present

## 2018-08-06 DIAGNOSIS — J449 Chronic obstructive pulmonary disease, unspecified: Secondary | ICD-10-CM | POA: Diagnosis not present

## 2018-08-06 NOTE — Patient Instructions (Addendum)
Your CT scan of the chest did not show any active inflammatory change associated with Sjogren's or methotrexate use.  This is good news.  We will continue to follow you with chest x-rays going forward Please continue Symbicort 2 puffs twice daily.  Rinse and gargle after using. Keep your albuterol available to use either 1 nebulizer treatment or 2 puffs if needed for shortness of breath, chest tightness, wheezing. Get the flu shot in the fall. Follow with Dr Lamonte Sakai in 6 months or sooner if you have any problems

## 2018-08-06 NOTE — Assessment & Plan Note (Signed)
On methotrexate.  Her CT scan was just completed and I reviewed, shows some mild right greater than left basilar scar but no evidence for active inflammatory change, progressive pneumonitis or interstitial lung disease.  Reassured her about this.  We can follow this with chest x-rays, decrease the frequency of CTs unless she clinically changes.

## 2018-08-06 NOTE — Assessment & Plan Note (Signed)
Slowly improving from her recent pneumonia.  Continue her Symbicort, albuterol as needed.  Alpha-1 antitrypsin genotype normal.  Flu shot up-to-date

## 2018-08-06 NOTE — Progress Notes (Signed)
  Subjective:    Patient ID: Toni Myers, female    DOB: 12/22/1950, 68y.o.   MRN: [id number] HPI  ROV 05/29/18 --patient presents today to reestablish care, last seen by me in 70 but seen end of 2019 and then in our office 5 days ago for acute dyspnea, cough with sputum, wheezing.  She is 28, has a history of Sjogren's with uveitis, hypertension, depression.  I followed her for mixed restrictive and obstructive disease felt to be consistent with asthma, allergic rhinitis.  She also has some very mild centrilobular groundglass and micronodular disease by CT scan of the chest, last was 12/2015.  She required hospital admission 1/2 and was treated for COPD/asthma exacerbation with prednisone, antibiotics.  No evidence for pneumonia by chest x-ray.  Apparently she had only been using her Symbicort as needed prior to the hospitalization. She was discharge on a pred taper, still on 30mg . She is using albuterol 2-3x a day. Her breathing is better, not to baseline. She is back on Symbicort, using 2-3x a day. Minimal cough.   ROV 08/06/2018 --Toni Myers is 23, has a history of Sjogren's disease on MTX mixed obstruction and restriction on spirometry consistent with asthma.  She also has allergic rhinitis.  We have followed her for this and also for some mild centrilobular groundglass and micronodular disease on CT scan of the chest.  Since last time she had a normal alpha-1 antitrypsin test.  We repeated her CT scan of the chest on 08/03/2018 and I have reviewed.  This shows some mild bibasilar scarring without any groundglass or micronodular disease. She is now on Symbicort reliably - feels benefit from this. Good functional capacity, still not really back to baseline from her PNA beginning of the year. She uses albuterol about    Exam: Vitals:   08/06/18 1013  BP: 118/76  Pulse: 62  SpO2: 97%  Weight: 119 lb (54 kg)  Height: 5' 3.5" (1.613 m)    Gen: Pleasant, well-nourished, in no distress,  normal  affect, dry cough   ENT: No lesions,  mouth clear,  oropharynx clear, no postnasal drip  Neck: No JVD, no stridor  Lungs: No use of accessory muscles, no wheeze or crackles  Cardiovascular: RRR, heart sounds normal, no murmur or gallops, no peripheral edema  Musculoskeletal: No deformities, no cyanosis or clubbing  Neuro: alert, non focal  Skin: Warm, no lesions or rashes       Assessment & Plan:  Sjogren's disease ([facility]) On methotrexate.  Her CT scan was just completed and I reviewed, shows some mild right greater than left basilar scar but no evidence for active inflammatory change, progressive pneumonitis or interstitial lung disease.  Reassured her about this.  We can follow this with chest x-rays, decrease the frequency of CTs unless she clinically changes.  COPD (chronic obstructive pulmonary disease) ([place]) Slowly improving from her recent pneumonia.  Continue her Symbicort, albuterol as needed.  Alpha-1 antitrypsin genotype normal.  Flu shot up-to-date    Baltazar Apo, MD, PhD 08/06/2018, 10:52 AM [facility] [phone] or if no answer [phone]

## 2018-08-07 DIAGNOSIS — M419 Scoliosis, unspecified: Secondary | ICD-10-CM | POA: Diagnosis not present

## 2018-08-07 DIAGNOSIS — M810 Age-related osteoporosis without current pathological fracture: Secondary | ICD-10-CM | POA: Diagnosis not present

## 2018-09-12 DIAGNOSIS — L259 Unspecified contact dermatitis, unspecified cause: Secondary | ICD-10-CM | POA: Diagnosis not present

## 2018-09-18 DIAGNOSIS — M810 Age-related osteoporosis without current pathological fracture: Secondary | ICD-10-CM | POA: Diagnosis not present

## 2018-09-18 DIAGNOSIS — M5417 Radiculopathy, lumbosacral region: Secondary | ICD-10-CM | POA: Diagnosis not present

## 2018-09-18 DIAGNOSIS — M064 Inflammatory polyarthropathy: Secondary | ICD-10-CM | POA: Diagnosis not present

## 2018-09-18 DIAGNOSIS — D72819 Decreased white blood cell count, unspecified: Secondary | ICD-10-CM | POA: Diagnosis not present

## 2018-09-18 DIAGNOSIS — M79643 Pain in unspecified hand: Secondary | ICD-10-CM | POA: Diagnosis not present

## 2018-09-18 DIAGNOSIS — M150 Primary generalized (osteo)arthritis: Secondary | ICD-10-CM | POA: Diagnosis not present

## 2018-09-18 DIAGNOSIS — M3500 Sicca syndrome, unspecified: Secondary | ICD-10-CM | POA: Diagnosis not present

## 2018-09-18 DIAGNOSIS — H3093 Unspecified chorioretinal inflammation, bilateral: Secondary | ICD-10-CM | POA: Diagnosis not present

## 2018-09-18 DIAGNOSIS — Z79899 Other long term (current) drug therapy: Secondary | ICD-10-CM | POA: Diagnosis not present

## 2018-09-18 DIAGNOSIS — M3501 Sicca syndrome with keratoconjunctivitis: Secondary | ICD-10-CM | POA: Diagnosis not present

## 2018-10-18 DIAGNOSIS — L920 Granuloma annulare: Secondary | ICD-10-CM | POA: Diagnosis not present

## 2018-10-18 DIAGNOSIS — L308 Other specified dermatitis: Secondary | ICD-10-CM | POA: Diagnosis not present

## 2018-10-18 DIAGNOSIS — L271 Localized skin eruption due to drugs and medicaments taken internally: Secondary | ICD-10-CM | POA: Diagnosis not present

## 2018-10-18 DIAGNOSIS — M3501 Sicca syndrome with keratoconjunctivitis: Secondary | ICD-10-CM | POA: Diagnosis not present

## 2018-10-18 DIAGNOSIS — C44229 Squamous cell carcinoma of skin of left ear and external auricular canal: Secondary | ICD-10-CM | POA: Diagnosis not present

## 2018-10-18 DIAGNOSIS — Z79899 Other long term (current) drug therapy: Secondary | ICD-10-CM | POA: Diagnosis not present

## 2018-10-18 DIAGNOSIS — D485 Neoplasm of uncertain behavior of skin: Secondary | ICD-10-CM | POA: Diagnosis not present

## 2018-10-25 DIAGNOSIS — S76212A Strain of adductor muscle, fascia and tendon of left thigh, initial encounter: Secondary | ICD-10-CM | POA: Diagnosis not present

## 2018-10-25 DIAGNOSIS — M7062 Trochanteric bursitis, left hip: Secondary | ICD-10-CM | POA: Diagnosis not present

## 2018-10-25 DIAGNOSIS — Z6821 Body mass index (BMI) 21.0-21.9, adult: Secondary | ICD-10-CM | POA: Diagnosis not present

## 2018-10-30 DIAGNOSIS — Z85828 Personal history of other malignant neoplasm of skin: Secondary | ICD-10-CM | POA: Diagnosis not present

## 2018-10-30 DIAGNOSIS — C44229 Squamous cell carcinoma of skin of left ear and external auricular canal: Secondary | ICD-10-CM | POA: Diagnosis not present

## 2018-11-09 ENCOUNTER — Other Ambulatory Visit: Payer: Self-pay | Admitting: Nurse Practitioner

## 2018-11-09 ENCOUNTER — Ambulatory Visit
Admission: RE | Admit: 2018-11-09 | Discharge: 2018-11-09 | Disposition: A | Discharge status: Home / Self Care | Payer: PPO | Source: Ambulatory Visit | Attending: Nurse Practitioner | Admitting: Nurse Practitioner

## 2018-11-09 DIAGNOSIS — M25552 Pain in left hip: Secondary | ICD-10-CM

## 2018-11-09 DIAGNOSIS — M1612 Unilateral primary osteoarthritis, left hip: Secondary | ICD-10-CM | POA: Diagnosis not present

## 2018-11-11 ENCOUNTER — Other Ambulatory Visit: Payer: Self-pay

## 2018-11-11 ENCOUNTER — Emergency Department (HOSPITAL_COMMUNITY)
Admission: EM | Admit: 2018-11-11 | Discharge: 2018-11-11 | Disposition: A | Discharge status: Home / Self Care | Payer: PPO | Attending: Emergency Medicine | Admitting: Emergency Medicine

## 2018-11-11 ENCOUNTER — Encounter (HOSPITAL_COMMUNITY): Payer: Self-pay | Admitting: Emergency Medicine

## 2018-11-11 ENCOUNTER — Emergency Department (HOSPITAL_COMMUNITY): Payer: PPO

## 2018-11-11 DIAGNOSIS — R197 Diarrhea, unspecified: Secondary | ICD-10-CM

## 2018-11-11 DIAGNOSIS — Z79899 Other long term (current) drug therapy: Secondary | ICD-10-CM | POA: Diagnosis not present

## 2018-11-11 DIAGNOSIS — S32511A Fracture of superior rim of right pubis, initial encounter for closed fracture: Secondary | ICD-10-CM | POA: Diagnosis not present

## 2018-11-11 DIAGNOSIS — J449 Chronic obstructive pulmonary disease, unspecified: Secondary | ICD-10-CM | POA: Insufficient documentation

## 2018-11-11 DIAGNOSIS — R112 Nausea with vomiting, unspecified: Secondary | ICD-10-CM | POA: Diagnosis not present

## 2018-11-11 DIAGNOSIS — Z03818 Encounter for observation for suspected exposure to other biological agents ruled out: Secondary | ICD-10-CM | POA: Diagnosis not present

## 2018-11-11 DIAGNOSIS — F0390 Unspecified dementia without behavioral disturbance: Secondary | ICD-10-CM | POA: Diagnosis not present

## 2018-11-11 DIAGNOSIS — I129 Hypertensive chronic kidney disease with stage 1 through stage 4 chronic kidney disease, or unspecified chronic kidney disease: Secondary | ICD-10-CM | POA: Diagnosis not present

## 2018-11-11 DIAGNOSIS — N182 Chronic kidney disease, stage 2 (mild): Secondary | ICD-10-CM | POA: Diagnosis not present

## 2018-11-11 DIAGNOSIS — Z20828 Contact with and (suspected) exposure to other viral communicable diseases: Secondary | ICD-10-CM | POA: Insufficient documentation

## 2018-11-11 DIAGNOSIS — M419 Scoliosis, unspecified: Secondary | ICD-10-CM | POA: Diagnosis not present

## 2018-11-11 DIAGNOSIS — R109 Unspecified abdominal pain: Secondary | ICD-10-CM | POA: Diagnosis not present

## 2018-11-11 DIAGNOSIS — J45909 Unspecified asthma, uncomplicated: Secondary | ICD-10-CM | POA: Diagnosis not present

## 2018-11-11 LAB — COMPREHENSIVE METABOLIC PANEL
ALT: 15 U/L (ref 0–44)
AST: 20 U/L (ref 15–41)
Albumin: 4.2 g/dL (ref 3.5–5.0)
Alkaline Phosphatase: 72 U/L (ref 38–126)
Anion gap: 12 (ref 5–15)
BUN: 10 mg/dL (ref 8–23)
CO2: 23 mmol/L (ref 22–32)
Calcium: 9.2 mg/dL (ref 8.9–10.3)
Chloride: 100 mmol/L (ref 98–111)
Creatinine, Ser: 0.69 mg/dL (ref 0.44–1.00)
GFR calc Af Amer: >60 mL/min (ref >60)
GFR calc non Af Amer: >60 mL/min (ref >60)
Glucose, Bld: 131 mg/dL — ABNORMAL HIGH (ref 70–99)
Potassium: 3.8 mmol/L (ref 3.5–5.1)
Sodium: 135 mmol/L (ref 135–145)
Total Bilirubin: 1.1 mg/dL (ref 0.3–1.2)
Total Protein: 7.1 g/dL (ref 6.5–8.1)

## 2018-11-11 LAB — CBC
HCT: 37.9 % (ref 36.0–46.0)
Hemoglobin: 12.8 g/dL (ref 12.0–15.0)
MCH: 32.7 pg (ref 26.0–34.0)
MCHC: 33.8 g/dL (ref 30.0–36.0)
MCV: 96.7 fL (ref 80.0–100.0)
Platelets: 294 10*3/uL (ref 150–400)
RBC: 3.92 MIL/uL (ref 3.87–5.11)
RDW: 14.6 % (ref 11.5–15.5)
WBC: 8.8 10*3/uL (ref 4.0–10.5)
nRBC: 0 % (ref 0.0–0.2)

## 2018-11-11 LAB — URINALYSIS, ROUTINE W REFLEX MICROSCOPIC
Bilirubin Urine: NEGATIVE
Glucose, UA: NEGATIVE mg/dL
Ketones, ur: NEGATIVE mg/dL
Nitrite: NEGATIVE
Protein, ur: 30 mg/dL — AB
Specific Gravity, Urine: 1.021 (ref 1.005–1.030)
pH: 6 (ref 5.0–8.0)

## 2018-11-11 LAB — LIPASE, BLOOD: Lipase: 28 U/L (ref 11–51)

## 2018-11-11 MED ORDER — ONDANSETRON HCL 4 MG/2ML IJ SOLN
4.0000 mg | Freq: Once | INTRAMUSCULAR | Status: AC
  Administered 2018-11-11: 4 mg via INTRAVENOUS
  Filled 2018-11-11: qty 2

## 2018-11-11 MED ORDER — SODIUM CHLORIDE 0.9% FLUSH
3.0000 mL | Freq: Once | INTRAVENOUS | Status: AC
  Administered 2018-11-11: 3 mL via INTRAVENOUS

## 2018-11-11 MED ORDER — SODIUM CHLORIDE 0.9 % IV BOLUS
1000.0000 mL | Freq: Once | INTRAVENOUS | Status: AC
  Administered 2018-11-11: 1000 mL via INTRAVENOUS

## 2018-11-11 MED ORDER — ONDANSETRON 4 MG PO TBDP: 4.0000 mg | ORAL_TABLET | Freq: Three times a day (TID) | ORAL | 0 refills | Status: DC | PRN

## 2018-11-11 NOTE — ED Provider Notes (Signed)
[facility] EMERGENCY DEPARTMENT Provider Note   CSN: [id number] Arrival date & time: 11/11/18  0092    History   Chief Complaint Chief Complaint  Patient presents with  . Abdominal Pain  . Emesis    HPI Toni Myers is a 68 y.o. female.     HPI  This is a 68 year old female who presents with abdominal pain and vomiting.  Onset of symptoms several hours prior to arrival.  She reports lower crampy abdominal pain and multiple episodes of nonbilious nonbloody emesis.  She also reports loose stools.  She reports chills without documented fevers.  She also reports headache.  No recent cough, chest pain, or shortness of breath.  She denies urinary symptoms.  No known sick contacts.  She has not taken anything for her symptoms.  Currently she rates her pain at 6 out of 10.  Patient reports that she called into a telehealth visit and was recommended to be evaluated.  She states "I want to make sure I do not have the virus."  Past Medical History:  Diagnosis Date  . Anxiety   . Arthritis    Rheumatoid  . Asthma   . Chronic iritis, left eye   . Chronic low back pain   . CKD (chronic kidney disease), stage II 10/08/2015  . Closed fracture of multiple pubic rami ([place]) 07/19/2013  . Closed fracture of pelvic rim 07/19/2013  . COPD (chronic obstructive pulmonary disease) ([place])    no o2, Dr.Robert Byrum   . Cough   . Dementia without behavioral disturbance ([place])   . Depression   . GERD (gastroesophageal reflux disease)   . Heart murmur   . Hypertension   . Migraine   . Palpitations   . RLS (restless legs syndrome)   . Shingles   . Sjogrens syndrome [facility])     Patient Active Problem List   Diagnosis Date Noted  . COPD (chronic obstructive pulmonary disease) ([place]) 05/24/2018  . Sinus tachycardia 03/22/2018  . Pneumonitis 12/17/2015  . Bronchospasm   . Bronchospasm with bronchitis, acute   . Chest congestion   . Low TSH level 10/10/2015  . Palpitations  10/10/2015  . Chest tightness 10/10/2015  . CKD (chronic kidney disease), stage II 10/08/2015  . Acute respiratory failure with hypoxia ([place]) 10/06/2015  . Vocal cord dysfunction 10/02/2014  . Asthmatic bronchitis 08/04/2014  . Dementia without behavioral disturbance ([place])   . GERD (gastroesophageal reflux disease)   . Uveitis 07/22/2013  . Rheumatoid arthritis ([place]) 07/22/2013  . Depression 08/31/2011  . RLS (restless legs syndrome) 08/31/2011  . Sjogren's disease ([place]) 08/31/2011  . Essential hypertension 08/06/2007  . Cough 08/06/2007    Past Surgical History:  Procedure Laterality Date  . BACK SURGERY     07/1998 and 03/1999, herniated disc, 2 rod and 4 screws  . CARDIAC CATHETERIZATION     shadow no blockage  . COLONOSCOPY WITH PROPOFOL N/A 02/09/2015   Procedure: COLONOSCOPY WITH PROPOFOL;  Surgeon: Garlan Fair, MD;  Location: [facility];  Service: Endoscopy;  Laterality: N/A;  . ESOPHAGOGASTRODUODENOSCOPY (EGD) WITH PROPOFOL N/A 02/09/2015   Procedure: ESOPHAGOGASTRODUODENOSCOPY (EGD) WITH PROPOFOL;  Surgeon: Garlan Fair, MD;  Location: [facility];  Service: Endoscopy;  Laterality: N/A;  . TUBAL LIGATION       OB History    Gravida  2   Para  2   Term  2   Preterm      AB  Living        SAB      TAB      Ectopic      Multiple      Live Births               Home Medications    Prior to Admission medications   Medication Sig Start Date End Date Taking? Authorizing Provider  albuterol (PROAIR HFA) 108 (90 BASE) MCG/ACT inhaler Inhale 2 puffs into the lungs every 6 (six) hours as needed for wheezing or shortness of breath.     [provider]  albuterol (PROVENTIL) (2.5 MG/3ML) 0.083% nebulizer solution Take 2.5 mg by nebulization every 4 (four) hours as needed for wheezing or shortness of breath. Reported on 10/22/2015    [provider]  budesonide-formoterol (SYMBICORT) 160-4.5 MCG/ACT inhaler Inhale 2 puffs into  the lungs 2 (two) times daily as needed (SOB).     [provider]  calcium citrate (CALCITRATE - DOSED IN MG ELEMENTAL CALCIUM) 950 MG tablet Take 200 mg of elemental calcium by mouth daily.    [provider]  cyclobenzaprine (FLEXERIL) 10 MG tablet Take 1 tablet (10 mg total) by mouth at bedtime as needed. For spasms 07/22/13   Janece Canterbury, MD  escitalopram (LEXAPRO) 5 MG tablet Take 5 mg by mouth daily.    [provider]  FOLIC ACID PO Take 2 tablets by mouth daily.     [provider]  gabapentin (NEURONTIN) 300 MG capsule Take 1 capsule by mouth at bedtime as needed (leg pain).  03/25/18   [provider]  methotrexate 250 MG/10ML injection Inject 17.5 mg into the vein once. Injects 0.7 ml subcutaneously every Wed (in stomach).    [provider]  metoprolol succinate (TOPROL XL) 25 MG 24 hr tablet Take 0.5 tablets (12.5 mg total) by mouth daily. 05/27/18   Geradine Girt, DO  oxyCODONE-acetaminophen (PERCOCET/ROXICET) 5-325 MG tablet Take by mouth. 05/11/18   [provider]  RESTASIS 0.05 % ophthalmic emulsion Place 2 drops into both eyes daily. 09/24/15   [provider]  zoledronic acid (RECLAST) 5 MG/100ML SOLN injection Inject into the vein. 05/11/18   [provider]    Family History Family History  Problem Relation Age of Onset  . Arthritis Mother     Social History Social History   Tobacco Use  . Smoking status: Never Smoker  . Smokeless tobacco: Never Used  Substance Use Topics  . Alcohol use: Yes    Comment: 2 glasses monthly  . Drug use: No     Allergies   Morphine and related and Prednisone   Review of Systems Review of Systems  Constitutional: Positive for chills. Negative for fever.  Respiratory: Negative for cough and shortness of breath.   Cardiovascular: Negative for chest pain.  Gastrointestinal: Positive for abdominal pain, diarrhea, nausea and vomiting. Negative for  blood in stool.  Genitourinary: Negative for dysuria.  Musculoskeletal: Negative for back pain.  Neurological: Positive for headaches.  All other systems reviewed and are negative.    Physical Exam Updated Vital Signs BP (!) 176/100 (BP Location: Right Arm)   Pulse 94   Temp 98.4 F (36.9 C) (Oral)   Resp 14   Ht 1.626 m (5\' 4" )   Wt 54.4 kg   SpO2 98%   BMI 20.60 kg/m   Physical Exam Vitals signs and nursing note reviewed.  Constitutional:      Appearance: She is well-developed. She  is not ill-appearing.  HENT:     Head: Normocephalic and atraumatic.     Mouth/Throat:     Mouth: Mucous membranes are dry.  Eyes:     Pupils: Pupils are equal, round, and reactive to light.  Neck:     Musculoskeletal: Neck supple.  Cardiovascular:     Rate and Rhythm: Normal rate and regular rhythm.     Heart sounds: Normal heart sounds.  Pulmonary:     Effort: Pulmonary effort is normal. No respiratory distress.     Breath sounds: No wheezing.  Abdominal:     General: Bowel sounds are normal.     Palpations: Abdomen is soft.     Tenderness: There is no abdominal tenderness. There is no guarding or rebound.  Skin:    General: Skin is warm and dry.  Neurological:     Mental Status: She is alert and oriented to person, place, and time.  Psychiatric:        Mood and Affect: Mood normal.      ED Treatments / Results  Labs (all labs ordered are listed, but only abnormal results are displayed) Labs Reviewed  COMPREHENSIVE METABOLIC PANEL - Abnormal; Notable for the following components:      Result Value   Glucose, Bld 131 (*)    All other components within normal limits  URINALYSIS, ROUTINE W REFLEX MICROSCOPIC - Abnormal; Notable for the following components:   APPearance HAZY (*)    Hgb urine dipstick SMALL (*)    Protein, ur 30 (*)    Leukocytes,Ua MODERATE (*)    Bacteria, UA RARE (*)    All other components within normal limits  NOVEL CORONAVIRUS, NAA (HOSPITAL ORDER,  SEND-OUT TO REF LAB)  LIPASE, BLOOD  CBC    EKG None  Radiology Dg Abdomen Acute W/chest  Result Date: 11/11/2018 CLINICAL DATA:  68 y/o  F; abdominal pain, nausea, vomiting. EXAM: DG ABDOMEN ACUTE W/ 1V CHEST COMPARISON:  08/03/2018 CT chest. 05/18/2017 pelvis and hip radiographs. FINDINGS: Severe rotatory S-shaped scoliosis of the spine. Normal cardiac silhouette. Clear lungs. No pleural effusion or pneumothorax. Nonobstructive bowel gas pattern. L5-S1 PLIF hardware noted. Chronic fracture deformities of the right superior inferior pubic rami. IMPRESSION: Negative abdominal radiographs.  No acute cardiopulmonary disease. Electronically Signed   By: Kristine Garbe M.D.   On: 11/11/2018 06:50   Dg Hip Unilat With Pelvis 2-3 Views Left  Result Date: 11/10/2018 CLINICAL DATA:  Left-sided hip pain EXAM: DG HIP (WITH OR WITHOUT PELVIS) 2-3V LEFT COMPARISON:  05/18/2017 FINDINGS: Chronic fracture deformities of the right superior and inferior pubic rami. No acute displaced fracture or malalignment. Mild degenerative change of the left hip with spurring and mild joint space narrowing. IMPRESSION: 1. No acute osseous abnormality.  Mild degenerative change left hip 2. Chronic fracture deformities of the right superior and inferior pubic rami Electronically Signed   By: Donavan Foil M.D.   On: 11/10/2018 19:48    Procedures Procedures (including critical care time)  Medications Ordered in ED Medications  sodium chloride flush (NS) 0.9 % injection 3 mL (3 mLs Intravenous Given 11/11/18 0615)  sodium chloride 0.9 % bolus 1,000 mL (1,000 mLs Intravenous New Bag/Given 11/11/18 0614)  ondansetron (ZOFRAN) injection 4 mg (4 mg Intravenous Given 11/11/18 2440)     Initial Impression / Assessment and Plan / ED Course  I have reviewed the triage vital signs and the nursing notes.  Pertinent labs & imaging results that were available during my  care of the patient were reviewed by me and  considered in my medical decision making (see chart for details).        Patient presents with nausea, vomiting, abdominal discomfort.  Ongoing for the last several hours.  She is overall nontoxic and vital signs are reassuring.  Her abdominal exam is fairly benign.  She appears well-hydrated.  Patient was given pain and nausea medication as well as fluids.  Her lab work was reviewed and is largely reassuring including LFTs and lipase.  No leukocytosis.  Acute abdominal series without free air or evidence of obstruction.  COVID testing is pending although the patient is low risk.  Will discharge home with Zofran for nausea.  After history, exam, and medical workup I feel the patient has been appropriately medically screened and is safe for discharge home. Pertinent diagnoses were discussed with the patient. Patient was given return precautions.   Final Clinical Impressions(s) / ED Diagnoses   Final diagnoses:  Nausea vomiting and diarrhea    ED Discharge Orders    None       , Barbette Hair, MD 11/11/18 0700

## 2018-11-11 NOTE — ED Triage Notes (Signed)
Patient here with abdominal pain, nausea and vomiting.  She states she was having chills, feeling flushed, but no documented fever.  Patient states she has had soft stools, no diarrhea.

## 2018-11-11 NOTE — Discharge Instructions (Addendum)
You were seen today for abdominal pain nausea and vomiting.  Your work-up is reassuring.  You were tested for COVID-19.  Self isolate until test results return in approximately 2 days.  If you have new or worsening symptoms you should be reevaluated.

## 2018-11-13 ENCOUNTER — Telehealth (HOSPITAL_COMMUNITY): Payer: Self-pay

## 2018-11-15 DIAGNOSIS — L570 Actinic keratosis: Secondary | ICD-10-CM | POA: Diagnosis not present

## 2018-11-15 LAB — NOVEL CORONAVIRUS, NAA (HOSP ORDER, SEND-OUT TO REF LAB; TAT 18-24 HRS): SARS-CoV-2, NAA: NOT DETECTED

## 2018-11-19 DIAGNOSIS — Z01419 Encounter for gynecological examination (general) (routine) without abnormal findings: Secondary | ICD-10-CM | POA: Diagnosis not present

## 2018-11-19 DIAGNOSIS — Z1231 Encounter for screening mammogram for malignant neoplasm of breast: Secondary | ICD-10-CM | POA: Diagnosis not present

## 2018-11-19 DIAGNOSIS — Z6824 Body mass index (BMI) 24.0-24.9, adult: Secondary | ICD-10-CM | POA: Diagnosis not present

## 2018-11-19 DIAGNOSIS — M810 Age-related osteoporosis without current pathological fracture: Secondary | ICD-10-CM | POA: Diagnosis not present

## 2018-11-19 DIAGNOSIS — Z1211 Encounter for screening for malignant neoplasm of colon: Secondary | ICD-10-CM | POA: Diagnosis not present

## 2018-11-19 DIAGNOSIS — N907 Vulvar cyst: Secondary | ICD-10-CM | POA: Diagnosis not present

## 2018-11-20 DIAGNOSIS — M25552 Pain in left hip: Secondary | ICD-10-CM | POA: Insufficient documentation

## 2018-11-27 DIAGNOSIS — Z1159 Encounter for screening for other viral diseases: Secondary | ICD-10-CM | POA: Diagnosis not present

## 2018-11-28 ENCOUNTER — Telehealth: Payer: Self-pay | Admitting: Emergency Medicine

## 2018-11-28 DIAGNOSIS — Z1159 Encounter for screening for other viral diseases: Secondary | ICD-10-CM | POA: Diagnosis not present

## 2018-11-28 NOTE — Telephone Encounter (Signed)
OV notes, lab results, and CT scan has been faxed to [facility] at the given fax number by them. [facility] and spoke with Larene Beach letting her know this had been done and she verbalized understanding. Nothing further needed.

## 2018-12-03 ENCOUNTER — Other Ambulatory Visit: Payer: Self-pay | Admitting: Obstetrics and Gynecology

## 2018-12-03 DIAGNOSIS — N907 Vulvar cyst: Secondary | ICD-10-CM | POA: Diagnosis not present

## 2018-12-07 DIAGNOSIS — R102 Pelvic and perineal pain: Secondary | ICD-10-CM | POA: Diagnosis not present

## 2018-12-10 ENCOUNTER — Telehealth: Payer: Self-pay | Admitting: Emergency Medicine

## 2018-12-10 NOTE — Telephone Encounter (Signed)
Called and spoke to patient. Patient stated that she has developed a new onset productive cough with yellow phlegm. Patient states that her voice is raspy/hoarseness.  Patient stated she wanted to talk with Dr. Lamonte Sakai about symptoms. Scheduled patient for a televisit with Dr. Lamonte Sakai for tomorrow.  Nothing further needed at this time.

## 2018-12-11 ENCOUNTER — Encounter: Payer: Self-pay | Admitting: Emergency Medicine

## 2018-12-11 ENCOUNTER — Ambulatory Visit (INDEPENDENT_AMBULATORY_CARE_PROVIDER_SITE_OTHER): Payer: PPO | Admitting: Emergency Medicine

## 2018-12-11 ENCOUNTER — Other Ambulatory Visit: Payer: Self-pay

## 2018-12-11 DIAGNOSIS — J209 Acute bronchitis, unspecified: Secondary | ICD-10-CM | POA: Diagnosis not present

## 2018-12-11 DIAGNOSIS — R05 Cough: Secondary | ICD-10-CM

## 2018-12-11 NOTE — Progress Notes (Signed)
Virtual Visit via Telephone Note  I connected with Toni Myers on 12/11/18 at  3:45 PM EDT by telephone and verified that I am speaking with the correct person using two identifiers.  Location: Patient: Home Provider: Office   I discussed the limitations, risks, security and privacy concerns of performing an evaluation and management service by telephone and the availability of in person appointments. I also discussed with the patient that there may be a patient responsible charge related to this service. The patient expressed understanding and agreed to proceed.   History of Present Illness: 68 year old woman with a history of Sjogren's disease on methotrexate, mixed obstruction and restriction on her spirometry consistent with asthma, allergic rhinitis.  Managed on Symbicort, has pro-air available.   Observations/Objective: She had been well until 3 days ago, when she developed some hoarseness and cough. No fever, malaise. She has had some thick mucous, almost like plugs. She has some stable wheeze - no change from her baseline.   Assessment and Plan: We will treat with doxycycline 100mg  bid x 7 days.  Symptomatic care - tylenol cold& flu If she worsens, is not resolving in next several days then she will notify us. If that is the case then we should probably see in office w a CXR. If fever, any other suggestive sx then would re-check her for SARS-CoV2  Acute bronchitis Sound like an acute bronchitis versus viral process.  Has been afebrile and has no known COVID exposure.  Low risk for COVID.  I think is reasonable to treat her empirically with doxycycline, see if she improves.  She will let us know.  If she fails to improve or this persists then she should be assessed, probably have a chest x-ray, revisit any risk factors that would be suggestive of coronavirus.  If present then we can retest her (negative in June)    Follow Up Instructions: As above   I discussed the assessment  and treatment plan with the patient. The patient was provided an opportunity to ask questions and all were answered. The patient agreed with the plan and demonstrated an understanding of the instructions.   The patient was advised to call back or seek an in-person evaluation if the symptoms worsen or if the condition fails to improve as anticipated.  I provided 18 minutes of non-face-to-face time during this encounter.    Baltazar Apo, MD, PhD 12/11/2018, 4:15 PM [facility] [phone] or if no answer [phone]

## 2018-12-11 NOTE — Assessment & Plan Note (Signed)
Sound like an acute bronchitis versus viral process.  Has been afebrile and has no known COVID exposure.  Low risk for COVID.  I think is reasonable to treat her empirically with doxycycline, see if she improves.  She will let us know.  If she fails to improve or this persists then she should be assessed, probably have a chest x-ray, revisit any risk factors that would be suggestive of coronavirus.  If present then we can retest her (negative in June)

## 2018-12-12 ENCOUNTER — Telehealth: Payer: Self-pay | Admitting: Emergency Medicine

## 2018-12-12 MED ORDER — DOXYCYCLINE HYCLATE 100 MG PO TABS: 100.0000 mg | ORAL_TABLET | Freq: Two times a day (BID) | ORAL | 0 refills | Status: DC

## 2018-12-12 NOTE — Telephone Encounter (Signed)
Called patient and made aware rx sent to [facility]. Nothing further needed.  Assessment and Plan: We will treat with doxycycline 100mg  bid x 7 days.  Symptomatic care - tylenol cold& flu If she worsens, is not resolving in next several days then she will notify us. If that is the case then we should probably see in office w a CXR. If fever, any other suggestive sx then would re-check her for SARS-CoV2

## 2018-12-18 ENCOUNTER — Telehealth: Payer: Self-pay | Admitting: Emergency Medicine

## 2018-12-18 NOTE — Telephone Encounter (Signed)
Left message for patient to call back  

## 2018-12-18 NOTE — Telephone Encounter (Signed)
Pt is calling back [phone]

## 2018-12-18 NOTE — Telephone Encounter (Signed)
Primary Pulmonologist: RB Last office visit and with whom: televisit with RB on 12/11/2018 What do we see them for (pulmonary problems): COPD  Last OV assessment/plan:  Sound like an acute bronchitis versus viral process.  Has been afebrile and has no known COVID exposure.  Low risk for COVID.  I think is reasonable to treat her empirically with doxycycline, see if she improves.  She will let us know.  If she fails to improve or this persists then she should be assessed, probably have a chest x-ray, revisit any risk factors that would be suggestive of coronavirus.  If present then we can retest her (negative in June)  Was appointment offered to patient (explain)?  No, patient is requesting a CXR.    Reason for call: Spoke with patient. She stated that she has 1 day left of doxycycline. Her cough has not improved. She denied having a fever nor being around anyone with COVID.  She is still coughing up yellowish, greenish phlegm. She stated that she was advised that if she was not feeling well after taking the doxycycline to call back and RB would consider ordering a CXR. She is requesting a CXR.   TP, please advise since RB is not here today.

## 2018-12-18 NOTE — Telephone Encounter (Signed)
Needs to be checked for COVID 19 , please place order and send her to testing site tomorrow.  Can order Chest xray and have done at one of the hospitals. (set up for in early am so can be seen after with telemedicine)  Elizebeth Koller ABX and take mucinex dm Twice daily  As needed  Cough/congestion  Fluids and rest  Can set up video visit /televisit to discuss results of chest xray and further options tomorrow with APP   Please contact office for sooner follow up if symptoms do not improve or worsen or seek emergency care

## 2018-12-18 NOTE — Telephone Encounter (Signed)
Left message for patient to call back.   Will place orders when patient is aware of everything.

## 2018-12-19 ENCOUNTER — Other Ambulatory Visit: Payer: Self-pay

## 2018-12-19 ENCOUNTER — Telehealth: Payer: Self-pay | Admitting: Emergency Medicine

## 2018-12-19 DIAGNOSIS — M5417 Radiculopathy, lumbosacral region: Secondary | ICD-10-CM | POA: Diagnosis not present

## 2018-12-19 DIAGNOSIS — M3501 Sicca syndrome with keratoconjunctivitis: Secondary | ICD-10-CM | POA: Diagnosis not present

## 2018-12-19 DIAGNOSIS — M3500 Sicca syndrome, unspecified: Secondary | ICD-10-CM | POA: Diagnosis not present

## 2018-12-19 DIAGNOSIS — N907 Vulvar cyst: Secondary | ICD-10-CM | POA: Diagnosis not present

## 2018-12-19 DIAGNOSIS — M810 Age-related osteoporosis without current pathological fracture: Secondary | ICD-10-CM | POA: Diagnosis not present

## 2018-12-19 DIAGNOSIS — H3093 Unspecified chorioretinal inflammation, bilateral: Secondary | ICD-10-CM | POA: Diagnosis not present

## 2018-12-19 DIAGNOSIS — R0989 Other specified symptoms and signs involving the circulatory and respiratory systems: Secondary | ICD-10-CM

## 2018-12-19 DIAGNOSIS — D72819 Decreased white blood cell count, unspecified: Secondary | ICD-10-CM | POA: Diagnosis not present

## 2018-12-19 DIAGNOSIS — Z79899 Other long term (current) drug therapy: Secondary | ICD-10-CM | POA: Diagnosis not present

## 2018-12-19 DIAGNOSIS — Z20822 Contact with and (suspected) exposure to covid-19: Secondary | ICD-10-CM

## 2018-12-19 DIAGNOSIS — M064 Inflammatory polyarthropathy: Secondary | ICD-10-CM | POA: Diagnosis not present

## 2018-12-19 DIAGNOSIS — R6889 Other general symptoms and signs: Secondary | ICD-10-CM | POA: Diagnosis not present

## 2018-12-19 DIAGNOSIS — M150 Primary generalized (osteo)arthritis: Secondary | ICD-10-CM | POA: Diagnosis not present

## 2018-12-19 NOTE — Telephone Encounter (Signed)
Patient returned call and was advised that she was to self isolate until the results have been received.   Patient asked if was to still have the chest xray and the televisit to discuss. I told the patient I would check and call her back. She requested call back on her cell phone.   Called the patient back and left a message on her cell phone (based on prior conversation) to let her know that until the COVID test result has been received the xray and televisit to discuss will have to be delayed, and to please call back with any additional questions.

## 2018-12-19 NOTE — Telephone Encounter (Signed)
Pt returning call a/b having CXR done she can be reached @ [phone].Toni Myers

## 2018-12-19 NOTE — Telephone Encounter (Addendum)
Called and spoke to pt. Informed her of the recs per TP. Order placed for CXR, pt aware. Appt made with [place] on 7/30 as a televisit per pts request. Order placed for COVID test, address given for [facility] location. Pt verbalized understanding and denied any further questions or concerns at this time.

## 2018-12-19 NOTE — Telephone Encounter (Signed)
Parrett, Fonnie Mu, NP   Needs to be checked for COVID 19 , please place order and send her to testing site tomorrow.  Can order Chest xray and have done at one of the hospitals. (set up for in early am so can be seen after with telemedicine)  Elizebeth Koller ABX and take mucinex dm Twice daily  As needed  Cough/congestion  Fluids and rest  Can set up video visit /televisit to discuss results of chest xray and further options tomorrow with APP   Please contact office for sooner follow up if symptoms do not improve or worsen or seek emergency care      ---------------------------------------- [facility] x1 for pt.

## 2018-12-20 ENCOUNTER — Ambulatory Visit: Payer: PPO | Admitting: Nurse Practitioner

## 2018-12-20 ENCOUNTER — Other Ambulatory Visit: Payer: Self-pay

## 2018-12-21 ENCOUNTER — Ambulatory Visit (INDEPENDENT_AMBULATORY_CARE_PROVIDER_SITE_OTHER): Payer: PPO

## 2018-12-21 ENCOUNTER — Encounter: Payer: Self-pay | Admitting: Nurse Practitioner

## 2018-12-21 ENCOUNTER — Ambulatory Visit: Payer: PPO | Admitting: Nurse Practitioner

## 2018-12-21 ENCOUNTER — Other Ambulatory Visit: Payer: Self-pay

## 2018-12-21 VITALS — BP 110/64 | HR 74 | Temp 97.9°F | Ht 63.0 in | Wt 125.8 lb

## 2018-12-21 DIAGNOSIS — J209 Acute bronchitis, unspecified: Secondary | ICD-10-CM | POA: Diagnosis not present

## 2018-12-21 DIAGNOSIS — J4541 Moderate persistent asthma with (acute) exacerbation: Secondary | ICD-10-CM

## 2018-12-21 DIAGNOSIS — R05 Cough: Secondary | ICD-10-CM | POA: Diagnosis not present

## 2018-12-21 DIAGNOSIS — J449 Chronic obstructive pulmonary disease, unspecified: Secondary | ICD-10-CM

## 2018-12-21 DIAGNOSIS — R0989 Other specified symptoms and signs involving the circulatory and respiratory systems: Secondary | ICD-10-CM

## 2018-12-21 LAB — NOVEL CORONAVIRUS, NAA: SARS-CoV-2, NAA: NOT DETECTED

## 2018-12-21 MED ORDER — BENZONATATE 200 MG PO CAPS: 200.0000 mg | ORAL_CAPSULE | Freq: Three times a day (TID) | ORAL | 1 refills | Status: DC | PRN

## 2018-12-21 MED ORDER — BUDESONIDE-FORMOTEROL FUMARATE 160-4.5 MCG/ACT IN AERO: 2.0000 | INHALATION_SPRAY | Freq: Two times a day (BID) | RESPIRATORY_TRACT | 0 refills | Status: AC

## 2018-12-21 NOTE — Progress Notes (Signed)
@Patient  ID: Toni Myers, female    DOB: 11-Nov-1950, 68 y.o.   MRN: [id number]  Chief Complaint  Patient presents with  . Follow-up    Referring provider: Cyndi Bender, PA-C  HPI 68 year old woman with a history of Sjogren's disease on methotrexate, mixed obstruction and restriction on her spirometry consistent with asthma, allergic rhinitis.  Managed on Symbicort, has pro-air available.   Tests: CXR 12/21/18 - No acute cardiopulmonary abnormality. Advanced scoliosis.  OV 12/21/18 - Follow up Patient presents today for follow-up visit with x-ray.  She was last seen by Dr. Lamonte Sakai on 12/11/2018 for acute bronchitis.  She was treated with doxycycline.  She states that she does feel much better.  He denies any recent fever.  She states that her cough is resolving.  She denies any significant shortness of breath.  She did have recent COVID testing that was negative.  Her chest x-ray in office today shows no acute abnormality.  Patient is compliant with Symbicort and Proventil.  Denies f/c/s, n/v/d, hemoptysis, PND, leg swelling.   Allergies  Allergen Reactions  . Morphine And Related Nausea And Vomiting    "makes me sick at high doses"  . Prednisone     Causes insomnia by the 3rd day of taking.prednisone taper.    Immunization History  Administered Date(s) Administered  . Influenza Split 02/27/2012, 02/20/2014, 01/29/2015  . Influenza Whole 02/21/2011  . Pneumococcal-Unspecified 06/29/2009  . Tdap 10/27/2015    Past Medical History:  Diagnosis Date  . Anxiety   . Arthritis    Rheumatoid  . Asthma   . Chronic iritis, left eye   . Chronic low back pain   . CKD (chronic kidney disease), stage II 10/08/2015  . Closed fracture of multiple pubic rami ([place]) 07/19/2013  . Closed fracture of pelvic rim 07/19/2013  . COPD (chronic obstructive pulmonary disease) ([place])    no o2, Dr.Robert Byrum   . Cough   . Dementia without behavioral disturbance ([place])   . Depression   . GERD  (gastroesophageal reflux disease)   . Heart murmur   . Hypertension   . Migraine   . Palpitations   . RLS (restless legs syndrome)   . Shingles   . Sjogrens syndrome ([place])     Tobacco History: Social History   Tobacco Use  Smoking Status Never Smoker  Smokeless Tobacco Never Used   Counseling given: Not Answered   Outpatient Encounter Medications as of 12/21/2018  Medication Sig  . albuterol (PROAIR HFA) 108 (90 BASE) MCG/ACT inhaler Inhale 2 puffs into the lungs every 6 (six) hours as needed for wheezing or shortness of breath.   Marland Kitchen albuterol (PROVENTIL) (2.5 MG/3ML) 0.083% nebulizer solution Take 2.5 mg by nebulization every 4 (four) hours as needed for wheezing or shortness of breath. Reported on 10/22/2015  . budesonide-formoterol (SYMBICORT) 160-4.5 MCG/ACT inhaler Inhale 2 puffs into the lungs 2 (two) times daily as needed (SOB).   . calcium citrate (CALCITRATE - DOSED IN MG ELEMENTAL CALCIUM) 950 MG tablet Take 200 mg of elemental calcium by mouth daily.  . cyclobenzaprine (FLEXERIL) 10 MG tablet Take 1 tablet (10 mg total) by mouth at bedtime as needed. For spasms  . escitalopram (LEXAPRO) 5 MG tablet Take 5 mg by mouth daily as needed.   Marland Kitchen FOLIC ACID PO Take 2 tablets by mouth daily.   Marland Kitchen gabapentin (NEURONTIN) 300 MG capsule Take 1 capsule by mouth at bedtime as needed (leg pain).   . methotrexate 250 MG/10ML  injection Inject 17.5 mg into the vein once. Injects 0.7 ml subcutaneously every Wed (in stomach).  . metoprolol succinate (TOPROL XL) 25 MG 24 hr tablet Take 0.5 tablets (12.5 mg total) by mouth daily.  . ondansetron (ZOFRAN ODT) 4 MG disintegrating tablet Take 1 tablet (4 mg total) by mouth every 8 (eight) hours as needed for nausea or vomiting.  Marland Kitchen oxyCODONE-acetaminophen (PERCOCET/ROXICET) 5-325 MG tablet Take 1 tablet by mouth every 8 (eight) hours as needed.   . RESTASIS 0.05 % ophthalmic emulsion Place 2 drops into both eyes daily.  . Vitamin D, Ergocalciferol,  (DRISDOL) 1.25 MG (50000 UT) CAPS capsule cholecalciferol (vitamin D3) 1,250 mcg (50,000 unit) capsule  TAKE 1 CAPSULE WEEKLY X 8 WEEKS (NOT COVERED BY PLAN)  . zoledronic acid (RECLAST) 5 MG/100ML SOLN injection Inject into the vein.  . benzocaine-Menthol (DERMOPLAST) 20-0.5 % AERO as needed.  . benzonatate (TESSALON) 200 MG capsule Take 1 capsule (200 mg total) by mouth 3 (three) times daily as needed for cough.  . budesonide-formoterol (SYMBICORT) 160-4.5 MCG/ACT inhaler Inhale 2 puffs into the lungs 2 (two) times daily.  Marland Kitchen ibuprofen (ADVIL) 600 MG tablet Take 1 tablet by mouth as needed.  . [DISCONTINUED] doxycycline (VIBRA-TABS) 100 MG tablet Take 1 tablet (100 mg total) by mouth 2 (two) times daily. (Patient not taking: Reported on 12/21/2018)   No facility-administered encounter medications on file as of 12/21/2018.      Review of Systems  Review of Systems  Constitutional: Negative.  Negative for chills and fever.  HENT: Negative.   Respiratory: Positive for cough and shortness of breath. Negative for wheezing.   Cardiovascular: Negative.  Negative for chest pain, palpitations and leg swelling.  Gastrointestinal: Negative.   Allergic/Immunologic: Negative.   Neurological: Negative.   Psychiatric/Behavioral: Negative.        Physical Exam  BP 110/64 (BP Location: Left Arm, Patient Position: Sitting, Cuff Size: Normal)   Pulse 74   Temp 97.9 F (36.6 C)   Ht 5\' 3"  (1.6 m)   Wt 125 lb 12.8 oz (57.1 kg)   SpO2 100%   BMI 22.28 kg/m   Wt Readings from Last 5 Encounters:  12/21/18 125 lb 12.8 oz (57.1 kg)  11/11/18 120 lb (54.4 kg)  08/06/18 119 lb (54 kg)  06/22/18 118 lb 1.9 oz (53.6 kg)  05/29/18 119 lb (54 kg)     Physical Exam Vitals signs and nursing note reviewed.  Constitutional:      General: She is not in acute distress.    Appearance: She is well-developed.  Cardiovascular:     Rate and Rhythm: Normal rate and regular rhythm.  Pulmonary:     Effort:  Pulmonary effort is normal. No respiratory distress.     Breath sounds: Normal breath sounds. No wheezing or rhonchi.  Musculoskeletal:        General: No swelling.  Neurological:     Mental Status: She is alert and oriented to person, place, and time.       Assessment & Plan:   Acute bronchitis Resolving bronchitis. Patient was last seen by Dr. Lamonte Sakai on 12/11/2018 for acute bronchitis.  She was treated with doxycycline.  She states that she does feel much better.  He denies any recent fever.  She states that her cough is resolving.  She denies any significant shortness of breath.  She did have recent COVID testing that was negative.  Her chest x-ray in office today shows no acute abnormality.  Patient is  compliant with Symbicort and Proventil.    Patient Instructions  Continue Symbicort Continue Albuterol Chest x ray was clear in office today  Follow up: Follow up with Dr. Lamonte Sakai or sooner if needed       Fenton Foy, NP 12/21/2018

## 2018-12-21 NOTE — Assessment & Plan Note (Signed)
Resolving bronchitis. Patient was last seen by Dr. Lamonte Sakai on 12/11/2018 for acute bronchitis.  She was treated with doxycycline.  She states that she does feel much better.  He denies any recent fever.  She states that her cough is resolving.  She denies any significant shortness of breath.  She did have recent COVID testing that was negative.  Her chest x-ray in office today shows no acute abnormality.  Patient is compliant with Symbicort and Proventil.    Patient Instructions  Continue Symbicort Continue Albuterol Chest x ray was clear in office today  Follow up: Follow up with Dr. Lamonte Sakai or sooner if needed

## 2018-12-21 NOTE — Patient Instructions (Addendum)
Continue Symbicort Continue Albuterol Chest x ray was clear in office today  Follow up: Follow up with Dr. Lamonte Sakai or sooner if needed

## 2018-12-28 DIAGNOSIS — N907 Vulvar cyst: Secondary | ICD-10-CM | POA: Diagnosis not present

## 2019-01-07 DIAGNOSIS — N907 Vulvar cyst: Secondary | ICD-10-CM | POA: Diagnosis not present

## 2019-01-23 DIAGNOSIS — E559 Vitamin D deficiency, unspecified: Secondary | ICD-10-CM | POA: Diagnosis not present

## 2019-01-31 DIAGNOSIS — M1611 Unilateral primary osteoarthritis, right hip: Secondary | ICD-10-CM | POA: Diagnosis not present

## 2019-01-31 DIAGNOSIS — M160 Bilateral primary osteoarthritis of hip: Secondary | ICD-10-CM | POA: Diagnosis not present

## 2019-02-26 ENCOUNTER — Ambulatory Visit (INDEPENDENT_AMBULATORY_CARE_PROVIDER_SITE_OTHER): Payer: PPO | Admitting: Emergency Medicine

## 2019-02-26 ENCOUNTER — Encounter: Payer: Self-pay | Admitting: Emergency Medicine

## 2019-02-26 ENCOUNTER — Other Ambulatory Visit: Payer: Self-pay

## 2019-02-26 ENCOUNTER — Ambulatory Visit: Payer: PPO | Admitting: Emergency Medicine

## 2019-02-26 ENCOUNTER — Telehealth: Payer: Self-pay | Admitting: Emergency Medicine

## 2019-02-26 DIAGNOSIS — J310 Chronic rhinitis: Secondary | ICD-10-CM | POA: Insufficient documentation

## 2019-02-26 DIAGNOSIS — M3500 Sicca syndrome, unspecified: Secondary | ICD-10-CM | POA: Diagnosis not present

## 2019-02-26 DIAGNOSIS — J449 Chronic obstructive pulmonary disease, unspecified: Secondary | ICD-10-CM

## 2019-02-26 MED ORDER — BUDESONIDE-FORMOTEROL FUMARATE 160-4.5 MCG/ACT IN AERO: 2.0000 | INHALATION_SPRAY | Freq: Two times a day (BID) | RESPIRATORY_TRACT | 0 refills | Status: DC

## 2019-02-26 NOTE — Progress Notes (Signed)
Virtual Visit via Telephone Note  I connected with Toni Myers on 02/26/19 at 11:45 AM EDT by telephone and verified that I am speaking with the correct person using two identifiers.  Location: Patient: Home Provider: Home Office   I discussed the limitations, risks, security and privacy concerns of performing an evaluation and management service by telephone and the availability of in person appointments. I also discussed with the patient that there may be a patient responsible charge related to this service. The patient expressed understanding and agreed to proceed.   History of Present Illness: 68 year old woman with Sjogren's disease on methotrexate, mixed obstruction and restriction on pulmonary function testing consistent with asthma.  She also has allergic rhinitis.  This is a regular follow-up visit   Observations/Objective: She has been doing fairly well - although she has used albuterol nebs a bit more frequently since the allergy season has started. She is on Symbicort, uses reliably. She coughs a few times a week, yellow and sticky.   No flares since last time. Planning for flu shot. She thinks that she had the PNA shot in the last 5 yrs - I don't have it in my records. She will try to obtain this  Assessment and Plan: COPD (chronic obstructive pulmonary disease) ([place]) For the most part stable - some increase in sx and albuterol use since fall allergy season, no pred or abx.   Plan to continue Symbicort twice daily as ordered, albuterol as needed.  She needs a flu shot and has a plan to obtain this.  She believes that she is had the pneumonia vaccine at some point in the last 5 years but she cannot remember exact timing.  I do not have this in my records.  She will work on obtaining it for me.  Follow-up in 6 months or sooner if she has any problems.  Chronic rhinitis Contributor to her chronic cough.  She is on loratadine, fluticasone, plan to continue same.  Overall good  control  Sjogren's disease ([place]) On stable immunosuppression.  She will need a chest x-ray at our next visit given her methotrexate use.     Follow Up Instructions: 6 months   I discussed the assessment and treatment plan with the patient. The patient was provided an opportunity to ask questions and all were answered. The patient agreed with the plan and demonstrated an understanding of the instructions.   The patient was advised to call back or seek an in-person evaluation if the symptoms worsen or if the condition fails to improve as anticipated.  I provided 21 minutes of non-face-to-face time during this encounter.   Collene Gobble, MD

## 2019-02-26 NOTE — Telephone Encounter (Signed)
1 sample symbicort 160 provided  I advised needs to complete pt assistance forms and these were provided to her as well  She will complete and then return for [place] to complete our portion and fax back  Nothing further needed at this time

## 2019-02-26 NOTE — Assessment & Plan Note (Signed)
Contributor to her chronic cough.  She is on loratadine, fluticasone, plan to continue same.  Overall good control

## 2019-02-26 NOTE — Assessment & Plan Note (Signed)
On stable immunosuppression.  She will need a chest x-ray at our next visit given her methotrexate use.

## 2019-02-26 NOTE — Assessment & Plan Note (Signed)
For the most part stable - some increase in sx and albuterol use since fall allergy season, no pred or abx.   Plan to continue Symbicort twice daily as ordered, albuterol as needed.  She needs a flu shot and has a plan to obtain this.  She believes that she is had the pneumonia vaccine at some point in the last 5 years but she cannot remember exact timing.  I do not have this in my records.  She will work on obtaining it for me.  Follow-up in 6 months or sooner if she has any problems.

## 2019-02-28 DIAGNOSIS — Z981 Arthrodesis status: Secondary | ICD-10-CM | POA: Diagnosis not present

## 2019-02-28 DIAGNOSIS — M419 Scoliosis, unspecified: Secondary | ICD-10-CM | POA: Diagnosis not present

## 2019-02-28 DIAGNOSIS — M5136 Other intervertebral disc degeneration, lumbar region: Secondary | ICD-10-CM | POA: Diagnosis not present

## 2019-02-28 DIAGNOSIS — Z79899 Other long term (current) drug therapy: Secondary | ICD-10-CM | POA: Diagnosis not present

## 2019-02-28 DIAGNOSIS — M5134 Other intervertebral disc degeneration, thoracic region: Secondary | ICD-10-CM | POA: Diagnosis not present

## 2019-03-01 ENCOUNTER — Telehealth: Payer: Self-pay | Admitting: Emergency Medicine

## 2019-03-01 MED ORDER — LORATADINE 10 MG PO TABS: 10.0000 mg | ORAL_TABLET | Freq: Every day | ORAL | 3 refills | Status: DC

## 2019-03-01 NOTE — Telephone Encounter (Signed)
Made Pt aware prescription called to pharmacy.  Please advise.  [phone].  [phone].

## 2019-03-01 NOTE — Telephone Encounter (Signed)
Spoke with patient. She stated that RX was not ready when she stopped by the pharmacy but this was a few days ago. Advised her that the medication was sent at 146pm. She verbalized understanding. Nothing further needed at time of call.

## 2019-03-01 NOTE — Telephone Encounter (Signed)
LMTCB  Medication has been sent to pharmacy. (per AVS continue loratadine (02/26/2019).

## 2019-03-07 DIAGNOSIS — H53001 Unspecified amblyopia, right eye: Secondary | ICD-10-CM | POA: Diagnosis not present

## 2019-03-07 DIAGNOSIS — G8929 Other chronic pain: Secondary | ICD-10-CM | POA: Diagnosis not present

## 2019-03-07 DIAGNOSIS — H2513 Age-related nuclear cataract, bilateral: Secondary | ICD-10-CM | POA: Diagnosis not present

## 2019-03-07 DIAGNOSIS — H40013 Open angle with borderline findings, low risk, bilateral: Secondary | ICD-10-CM | POA: Diagnosis not present

## 2019-03-07 DIAGNOSIS — M545 Low back pain: Secondary | ICD-10-CM | POA: Diagnosis not present

## 2019-03-11 DIAGNOSIS — H25013 Cortical age-related cataract, bilateral: Secondary | ICD-10-CM | POA: Diagnosis not present

## 2019-03-11 DIAGNOSIS — H2513 Age-related nuclear cataract, bilateral: Secondary | ICD-10-CM | POA: Diagnosis not present

## 2019-03-13 DIAGNOSIS — M25551 Pain in right hip: Secondary | ICD-10-CM | POA: Diagnosis not present

## 2019-03-13 DIAGNOSIS — M25552 Pain in left hip: Secondary | ICD-10-CM | POA: Diagnosis not present

## 2019-03-29 DIAGNOSIS — J441 Chronic obstructive pulmonary disease with (acute) exacerbation: Secondary | ICD-10-CM | POA: Diagnosis not present

## 2019-04-05 DIAGNOSIS — M064 Inflammatory polyarthropathy: Secondary | ICD-10-CM | POA: Diagnosis not present

## 2019-04-05 DIAGNOSIS — M150 Primary generalized (osteo)arthritis: Secondary | ICD-10-CM | POA: Diagnosis not present

## 2019-04-05 DIAGNOSIS — M3501 Sicca syndrome with keratoconjunctivitis: Secondary | ICD-10-CM | POA: Diagnosis not present

## 2019-04-05 DIAGNOSIS — H3093 Unspecified chorioretinal inflammation, bilateral: Secondary | ICD-10-CM | POA: Diagnosis not present

## 2019-04-05 DIAGNOSIS — M810 Age-related osteoporosis without current pathological fracture: Secondary | ICD-10-CM | POA: Diagnosis not present

## 2019-04-05 DIAGNOSIS — M3500 Sicca syndrome, unspecified: Secondary | ICD-10-CM | POA: Diagnosis not present

## 2019-04-05 DIAGNOSIS — D72819 Decreased white blood cell count, unspecified: Secondary | ICD-10-CM | POA: Diagnosis not present

## 2019-04-05 DIAGNOSIS — Z79899 Other long term (current) drug therapy: Secondary | ICD-10-CM | POA: Diagnosis not present

## 2019-04-05 DIAGNOSIS — M5417 Radiculopathy, lumbosacral region: Secondary | ICD-10-CM | POA: Diagnosis not present

## 2019-04-09 DIAGNOSIS — H25811 Combined forms of age-related cataract, right eye: Secondary | ICD-10-CM | POA: Diagnosis not present

## 2019-04-09 DIAGNOSIS — H25011 Cortical age-related cataract, right eye: Secondary | ICD-10-CM | POA: Diagnosis not present

## 2019-04-09 DIAGNOSIS — H2511 Age-related nuclear cataract, right eye: Secondary | ICD-10-CM | POA: Diagnosis not present

## 2019-04-11 DIAGNOSIS — M1612 Unilateral primary osteoarthritis, left hip: Secondary | ICD-10-CM | POA: Diagnosis not present

## 2019-04-11 DIAGNOSIS — M545 Low back pain: Secondary | ICD-10-CM | POA: Diagnosis not present

## 2019-04-11 DIAGNOSIS — M5416 Radiculopathy, lumbar region: Secondary | ICD-10-CM | POA: Diagnosis not present

## 2019-04-11 DIAGNOSIS — M25552 Pain in left hip: Secondary | ICD-10-CM | POA: Diagnosis not present

## 2019-04-23 DIAGNOSIS — H25812 Combined forms of age-related cataract, left eye: Secondary | ICD-10-CM | POA: Diagnosis not present

## 2019-04-23 DIAGNOSIS — H2512 Age-related nuclear cataract, left eye: Secondary | ICD-10-CM | POA: Diagnosis not present

## 2019-04-23 DIAGNOSIS — H25012 Cortical age-related cataract, left eye: Secondary | ICD-10-CM | POA: Diagnosis not present

## 2019-04-29 DIAGNOSIS — M5416 Radiculopathy, lumbar region: Secondary | ICD-10-CM | POA: Diagnosis not present

## 2019-05-08 DIAGNOSIS — L400 Psoriasis vulgaris: Secondary | ICD-10-CM | POA: Diagnosis not present

## 2019-05-08 DIAGNOSIS — B009 Herpesviral infection, unspecified: Secondary | ICD-10-CM | POA: Diagnosis not present

## 2019-05-08 DIAGNOSIS — Z85828 Personal history of other malignant neoplasm of skin: Secondary | ICD-10-CM | POA: Diagnosis not present

## 2019-05-11 ENCOUNTER — Other Ambulatory Visit: Payer: Self-pay | Admitting: Cardiovascular Disease

## 2019-05-23 ENCOUNTER — Other Ambulatory Visit: Payer: Self-pay | Admitting: Pulmonary Disease

## 2019-05-23 MED ORDER — AZITHROMYCIN 250 MG PO TABS: 500.0000 mg | ORAL_TABLET | Freq: Every day | ORAL | 0 refills | Status: DC

## 2019-05-23 MED ORDER — PREDNISONE 10 MG PO TABS: 10.0000 mg | ORAL_TABLET | Freq: Every day | ORAL | 0 refills | Status: DC

## 2019-05-23 NOTE — Progress Notes (Signed)
Patient called in transfer service with tightness and wheezing  Admit shortness of breath Increased cough, minimal phlegm production No fevers, no chills  Nebulization did not help    Bronchitic exacerbation of COPD  Called in azithromycin 500 p.o. daily for 3 days Prednisone 10 mg daily for 7 days  Encouraged to use nebulization treatment up to 4 times a day as needed

## 2019-06-02 ENCOUNTER — Other Ambulatory Visit: Payer: Self-pay | Admitting: Cardiovascular Disease

## 2019-06-21 ENCOUNTER — Other Ambulatory Visit: Payer: Self-pay | Admitting: Cardiovascular Disease

## 2019-07-03 DIAGNOSIS — Z139 Encounter for screening, unspecified: Secondary | ICD-10-CM | POA: Diagnosis not present

## 2019-07-03 DIAGNOSIS — M545 Low back pain: Secondary | ICD-10-CM | POA: Diagnosis not present

## 2019-07-03 DIAGNOSIS — M5416 Radiculopathy, lumbar region: Secondary | ICD-10-CM | POA: Diagnosis not present

## 2019-07-15 DIAGNOSIS — M545 Low back pain: Secondary | ICD-10-CM | POA: Diagnosis not present

## 2019-07-19 DIAGNOSIS — M5416 Radiculopathy, lumbar region: Secondary | ICD-10-CM | POA: Diagnosis not present

## 2019-07-26 DIAGNOSIS — M5136 Other intervertebral disc degeneration, lumbar region: Secondary | ICD-10-CM | POA: Diagnosis present

## 2019-08-06 DIAGNOSIS — M5136 Other intervertebral disc degeneration, lumbar region: Secondary | ICD-10-CM | POA: Diagnosis not present

## 2019-08-06 DIAGNOSIS — M25551 Pain in right hip: Secondary | ICD-10-CM | POA: Diagnosis not present

## 2019-08-13 DIAGNOSIS — J439 Emphysema, unspecified: Secondary | ICD-10-CM | POA: Diagnosis not present

## 2019-08-13 DIAGNOSIS — J984 Other disorders of lung: Secondary | ICD-10-CM | POA: Diagnosis not present

## 2019-08-13 DIAGNOSIS — M3500 Sicca syndrome, unspecified: Secondary | ICD-10-CM | POA: Diagnosis not present

## 2019-08-13 DIAGNOSIS — Z79899 Other long term (current) drug therapy: Secondary | ICD-10-CM | POA: Diagnosis not present

## 2019-08-13 DIAGNOSIS — I10 Essential (primary) hypertension: Secondary | ICD-10-CM | POA: Diagnosis not present

## 2019-08-13 DIAGNOSIS — G8929 Other chronic pain: Secondary | ICD-10-CM | POA: Diagnosis not present

## 2019-08-13 DIAGNOSIS — G3184 Mild cognitive impairment, so stated: Secondary | ICD-10-CM | POA: Diagnosis not present

## 2019-08-13 DIAGNOSIS — F418 Other specified anxiety disorders: Secondary | ICD-10-CM | POA: Diagnosis not present

## 2019-08-13 DIAGNOSIS — M545 Low back pain: Secondary | ICD-10-CM | POA: Diagnosis not present

## 2019-08-13 DIAGNOSIS — Z139 Encounter for screening, unspecified: Secondary | ICD-10-CM | POA: Diagnosis not present

## 2019-08-13 DIAGNOSIS — Z6824 Body mass index (BMI) 24.0-24.9, adult: Secondary | ICD-10-CM | POA: Diagnosis not present

## 2019-08-14 DIAGNOSIS — L400 Psoriasis vulgaris: Secondary | ICD-10-CM | POA: Diagnosis not present

## 2019-08-14 DIAGNOSIS — Z85828 Personal history of other malignant neoplasm of skin: Secondary | ICD-10-CM | POA: Diagnosis not present

## 2019-08-14 DIAGNOSIS — B0089 Other herpesviral infection: Secondary | ICD-10-CM | POA: Diagnosis not present

## 2019-08-14 DIAGNOSIS — L814 Other melanin hyperpigmentation: Secondary | ICD-10-CM | POA: Diagnosis not present

## 2019-08-14 DIAGNOSIS — T691XXD Chilblains, subsequent encounter: Secondary | ICD-10-CM | POA: Diagnosis not present

## 2019-08-21 DIAGNOSIS — M5136 Other intervertebral disc degeneration, lumbar region: Secondary | ICD-10-CM | POA: Diagnosis not present

## 2019-09-12 ENCOUNTER — Other Ambulatory Visit: Payer: Self-pay | Admitting: Cardiovascular Disease

## 2019-09-17 DIAGNOSIS — M810 Age-related osteoporosis without current pathological fracture: Secondary | ICD-10-CM | POA: Diagnosis not present

## 2019-09-17 DIAGNOSIS — M064 Inflammatory polyarthropathy: Secondary | ICD-10-CM | POA: Diagnosis not present

## 2019-09-17 DIAGNOSIS — M3501 Sicca syndrome with keratoconjunctivitis: Secondary | ICD-10-CM | POA: Diagnosis not present

## 2019-09-17 DIAGNOSIS — M150 Primary generalized (osteo)arthritis: Secondary | ICD-10-CM | POA: Diagnosis not present

## 2019-09-17 DIAGNOSIS — M3500 Sicca syndrome, unspecified: Secondary | ICD-10-CM | POA: Diagnosis not present

## 2019-09-17 DIAGNOSIS — M5417 Radiculopathy, lumbosacral region: Secondary | ICD-10-CM | POA: Diagnosis not present

## 2019-09-17 DIAGNOSIS — Z79899 Other long term (current) drug therapy: Secondary | ICD-10-CM | POA: Diagnosis not present

## 2019-09-17 DIAGNOSIS — D72819 Decreased white blood cell count, unspecified: Secondary | ICD-10-CM | POA: Diagnosis not present

## 2019-09-17 DIAGNOSIS — H3093 Unspecified chorioretinal inflammation, bilateral: Secondary | ICD-10-CM | POA: Diagnosis not present

## 2019-10-09 DIAGNOSIS — M5136 Other intervertebral disc degeneration, lumbar region: Secondary | ICD-10-CM | POA: Diagnosis not present

## 2019-10-09 DIAGNOSIS — M25551 Pain in right hip: Secondary | ICD-10-CM | POA: Diagnosis not present

## 2019-10-12 DIAGNOSIS — M25551 Pain in right hip: Secondary | ICD-10-CM | POA: Diagnosis not present

## 2019-10-24 DIAGNOSIS — M25551 Pain in right hip: Secondary | ICD-10-CM | POA: Diagnosis not present

## 2019-10-25 ENCOUNTER — Telehealth: Payer: Self-pay

## 2019-10-25 NOTE — Telephone Encounter (Signed)
° °  Primary Cardiologist:Philip Nahser, MD  Chart reviewed as part of pre-operative protocol coverage. Because of Toni Myers's past medical history and time since last visit, they will require a follow-up visit in order to better assess preoperative cardiovascular risk.  Pre-op covering staff: - Please schedule appointment and call patient to inform them. If patient already had an upcoming appointment within acceptable timeframe, please add "pre-op clearance" to the appointment notes so provider is aware. - Please contact requesting surgeon's office via preferred method (i.e, phone, fax) to inform them of need for appointment prior to surgery.  If applicable, this message will also be routed to pharmacy pool and/or primary cardiologist for input on holding anticoagulant/antiplatelet agent as requested below so that this information is available to the clearing provider at time of patient's appointment.   [place], [place]  10/25/2019, 6:59 PM

## 2019-10-25 NOTE — Telephone Encounter (Signed)
   Toni Myers Toni Pre-operative Risk Assessment    Toni Myers: - Please ensure there is not already an duplicate clearance open for this procedure. - Under Visit Info/Reason for Call, type in Other and utilize the format Clearance MM/DD/YY or Clearance TBD. Do not use dashes or single digits. - If request is for dental extraction, please clarify the # of teeth to be extracted.  Request for surgical clearance:  1. What type of surgery is being performed? Toni Myers   2. When is this surgery scheduled? 11/13/19   3. What type of clearance is required (medical clearance vs. Pharmacy clearance to hold med vs. Both)? MEDICAL  4. Are there any medications that need to be held prior to surgery and how long? NONE   5. Practice name and name of physician performing surgery? Toni Myers, Toni Myers   6. What is the office phone number? [phone]   7.   What is the office fax number? [phone] Toni Myers  8.   Anesthesia type (None, local, MAC, general) ? CHOICE    Toni Myers 10/25/2019, 5:43 PM  _________________________________________________________________   (provider comments below)

## 2019-10-28 NOTE — Patient Instructions (Addendum)
DUE TO COVID-19 ONLY ONE VISITOR IS ALLOWED TO COME WITH YOU AND STAY IN THE WAITING ROOM ONLY DURING PRE OP AND PROCEDURE DAY OF SURGERY. THE 2 VISITORS  MAY VISIT WITH YOU AFTER SURGERY IN YOUR PRIVATE ROOM DURING VISITING HOURS ONLY!  YOU NEED TO HAVE A COVID 19 TEST ON__Sat 6/19_____ @_10 :15______, THIS TEST MUST BE DONE BEFORE SURGERY, COME  [address], [place] , [address].  ([place]) ONCE YOUR COVID TEST IS COMPLETED, PLEASE BEGIN THE QUARANTINE INSTRUCTIONS AS OUTLINED IN YOUR HANDOUT.                Toni Myers   Your procedure is scheduled on: 11/13/19   Report to [facility] Main  Entrance   Report to admitting at   12:10 PM     Call this number if you have problems the morning of surgery [place], NO CHEWING GUM [place].    Do not eat food After Midnight.   YOU MAY HAVE CLEAR LIQUIDS FROM MIDNIGHT UNTIL 11:30 AM.     CLEAR LIQUID DIET   Foods Allowed                                                                     Foods Excluded  Coffee and tea, regular and decaf                             liquids that you cannot  Plain Jell-O any favor except red or purple                                           see through such as: Fruit ices (not with fruit pulp)                                     milk, soups, orange juice  Iced Popsicles                                    All solid food Carbonated beverages, regular and diet                                    Cranberry, grape and apple juices Sports drinks like Gatorade Lightly seasoned clear broth or consume(fat free) Sugar, honey syrup     At 11:30 AM Please finish the prescribed Pre-Surgery Gatorade drink. Nothing by mouth after you finish the Gatorade drink !   Take these medicines the morning of surgery with A SIP OF WATER: Lexapro, Claritin, ,                Use inhalers and bring them with you to  the hospital  You may not have any metal on your body including hair pins and              piercings  Do not wear jewelry, make-up, lotions, powders or perfumes, deodorant             Do not wear nail polish on your fingernails.  Do not shave  48 hours prior to surgery.     Do not bring valuables to the hospital. [place].  Contacts, dentures or bridgework may not be worn into surgery.       Special Instructions: N/A              Please read over the following fact sheets you were given: _____________________________________________________________________             [facility] - Preparing for Surgery  Before surgery, you can play an important role.   Because skin is not sterile, your skin needs to be as free of germs as possible .  You can reduce the number of germs on your skin by washing with CHG (chlorahexidine gluconate) soap before surgery.   CHG is an antiseptic cleaner which kills germs and bonds with the skin to continue killing germs even after washing. Please DO NOT use if you have an allergy to CHG or antibacterial soaps.   If your skin becomes reddened/irritated stop using the CHG and inform your nurse when you arrive at Short Stay. Do not shave (including legs and underarms) for at least 48 hours prior to the first CHG shower.    Please follow these instructions carefully:  1.  Shower with CHG Soap the night before surgery and the  morning of Surgery.  2.  If you choose to wash your hair, wash your hair first as usual with your  normal  shampoo.  3.  After you shampoo, rinse your hair and body thoroughly to remove the  shampoo.                                        4.  Use CHG as you would any other liquid soap.  You can apply chg directly  to the skin and wash                       Gently with a scrungie or clean washcloth.  5.  Apply the CHG Soap to your body ONLY FROM THE NECK  DOWN.   Do not use on face/ open                           Wound or open sores. Avoid contact with eyes, ears mouth and genitals (private parts).                       Wash face,  Genitals (private parts) with your normal soap.             6.  Wash thoroughly, paying special attention to the area where your surgery  will be performed.  7.  Thoroughly rinse your body with warm water from the neck down.  8.  DO NOT shower/wash with your normal soap after using and rinsing off  the CHG Soap.  9.  Pat yourself dry with a clean towel.            10.  Wear clean pajamas.            11.  Place clean sheets on your bed the night of your first shower and do not  sleep with pets.  Day of Surgery : Do not apply any lotions/deodorants the morning of surgery.  Please wear clean clothes to the hospital/surgery center.   FAILURE TO FOLLOW THESE INSTRUCTIONS MAY RESULT IN THE CANCELLATION OF YOUR SURGERY PATIENT SIGNATURE_________________________________  NURSE SIGNATURE__________________________________  ________________________________________________________________________   Toni Myers  An incentive spirometer is a tool that can help keep your lungs clear and active. This tool measures how well you are filling your lungs with each breath. Taking long deep breaths may help reverse or decrease the chance of developing breathing (pulmonary) problems (especially infection) following:  A long period of time when you are unable to move or be active. BEFORE THE PROCEDURE   If the spirometer includes an indicator to show your best effort, your nurse or respiratory therapist will set it to a desired goal.  If possible, sit up straight or lean slightly forward. Try not to slouch.  Hold the incentive spirometer in an upright position. INSTRUCTIONS FOR USE  1. Sit on the edge of your bed if possible, or sit up as far as you can in bed or on a chair. 2. Hold the incentive spirometer  in an upright position. 3. Breathe out normally. 4. Place the mouthpiece in your mouth and seal your lips tightly around it. 5. Breathe in slowly and as deeply as possible, raising the piston or the ball toward the top of the column. 6. Hold your breath for 3-5 seconds or for as long as possible. Allow the piston or ball to fall to the bottom of the column. 7. Remove the mouthpiece from your mouth and breathe out normally. 8. Rest for a few seconds and repeat Steps 1 through 7 at least 10 times every 1-2 hours when you are awake. Take your time and take a few normal breaths between deep breaths. 9. The spirometer may include an indicator to show your best effort. Use the indicator as a goal to work toward during each repetition. 10. After each set of 10 deep breaths, practice coughing to be sure your lungs are clear. If you have an incision (the cut made at the time of surgery), support your incision when coughing by placing a pillow or rolled up towels firmly against it. Once you are able to get out of bed, walk around indoors and cough well. You may stop using the incentive spirometer when instructed by your caregiver.  RISKS AND COMPLICATIONS  Take your time so you do not get dizzy or light-headed.  If you are in pain, you may need to take or ask for pain medication before doing incentive spirometry. It is harder to take a deep breath if you are having pain. AFTER USE  Rest and breathe slowly and easily.  It can be helpful to keep track of a log of your progress. Your caregiver can provide you with a simple table to help with this. If you are using the spirometer at home, follow these instructions: [place] IF:   You are having difficultly using the spirometer.  You have trouble using the spirometer as often as instructed.  Your pain medication is not giving enough relief while using the spirometer.  You develop fever of 100.5 F (38.1 C) or higher. SEEK IMMEDIATE MEDICAL  CARE IF:   You cough up bloody sputum that had not been present before.  You develop fever of 102 F (38.9 C) or greater.  You develop worsening pain at or near the incision site. MAKE SURE YOU:   Understand these instructions.  Will watch your condition.  Will get help right away if you are not doing well or get worse. Document Released: 09/19/2006 Document Revised: 08/01/2011 Document Reviewed: 11/20/2006 [facility] Patient Information 2014 [place], [place].   ________________________________________________________________________

## 2019-10-28 NOTE — Telephone Encounter (Signed)
Left message for patient to call back and schedule appointment for surgical clearance.

## 2019-10-29 NOTE — Telephone Encounter (Signed)
Left message for patient to call back to schedule an appointment for surgical clearance.

## 2019-10-30 ENCOUNTER — Encounter (HOSPITAL_COMMUNITY): Payer: Self-pay

## 2019-10-30 ENCOUNTER — Encounter (HOSPITAL_COMMUNITY)
Admission: RE | Admit: 2019-10-30 | Discharge: 2019-10-30 | Disposition: A | Discharge status: Home / Self Care | Payer: PPO | Source: Ambulatory Visit | Attending: Orthopedic Surgery | Admitting: Orthopedic Surgery

## 2019-10-30 ENCOUNTER — Other Ambulatory Visit: Payer: Self-pay

## 2019-10-30 DIAGNOSIS — Z01812 Encounter for preprocedural laboratory examination: Secondary | ICD-10-CM | POA: Diagnosis not present

## 2019-10-30 HISTORY — DX: Cardiac arrhythmia, unspecified: I49.9

## 2019-10-30 HISTORY — DX: Dyspnea, unspecified: R06.00

## 2019-10-30 NOTE — Telephone Encounter (Signed)
appt with Ermalinda Barrios, [facility] 11/06/19 for pre op clearance. I will forward clearance notes to PA for upcoming appt. I will send FYI to surgeon Dr. Wynelle Link pt has appt with cardiologist 11/06/19 for pre op assessment. I will remove from the pre op call back pool.

## 2019-10-30 NOTE — Progress Notes (Signed)
COVID Vaccine Completed:yes Date COVID Vaccine completed:07/23/19 COVID vaccine manufacturer:  [place]     PCP - Cyndi Bender Cardiologist - Dr. Rae Halsted  Chest x-ray - 12/21/18 EKG -11/05/19  Stress Test - no ECHO - 2017 Cardiac Cath -no   Sleep Study - no CPAP -   Fasting Blood Sugar - NA Checks Blood Sugar _____ times a day  Blood Thinner Instructions:NA Aspirin Instructions: Last Dose:  Anesthesia review:   Patient denies shortness of breath, fever, cough and chest pain at PAT appointment yes  Patient verbalized understanding of instructions that were given to them at the PAT appointment. Patient was also instructed that they will need to review over the PAT instructions again at home before surgery. Yes  Pt was told to call her Rheumatologist to ask about holding Methotrexate

## 2019-10-31 DIAGNOSIS — R000 Tachycardia, unspecified: Secondary | ICD-10-CM | POA: Diagnosis not present

## 2019-10-31 DIAGNOSIS — Z01818 Encounter for other preprocedural examination: Secondary | ICD-10-CM | POA: Diagnosis not present

## 2019-10-31 DIAGNOSIS — I10 Essential (primary) hypertension: Secondary | ICD-10-CM | POA: Diagnosis not present

## 2019-10-31 DIAGNOSIS — J439 Emphysema, unspecified: Secondary | ICD-10-CM | POA: Diagnosis not present

## 2019-10-31 DIAGNOSIS — Z6825 Body mass index (BMI) 25.0-25.9, adult: Secondary | ICD-10-CM | POA: Diagnosis not present

## 2019-10-31 DIAGNOSIS — F418 Other specified anxiety disorders: Secondary | ICD-10-CM | POA: Diagnosis not present

## 2019-10-31 DIAGNOSIS — M3500 Sicca syndrome, unspecified: Secondary | ICD-10-CM | POA: Diagnosis not present

## 2019-10-31 DIAGNOSIS — J984 Other disorders of lung: Secondary | ICD-10-CM | POA: Diagnosis not present

## 2019-11-05 ENCOUNTER — Encounter (HOSPITAL_COMMUNITY)
Admission: RE | Admit: 2019-11-05 | Discharge: 2019-11-05 | Disposition: A | Discharge status: Home / Self Care | Payer: PPO | Source: Ambulatory Visit | Attending: Orthopedic Surgery | Admitting: Orthopedic Surgery

## 2019-11-05 ENCOUNTER — Other Ambulatory Visit: Payer: Self-pay

## 2019-11-05 DIAGNOSIS — Z01812 Encounter for preprocedural laboratory examination: Secondary | ICD-10-CM | POA: Insufficient documentation

## 2019-11-05 DIAGNOSIS — Z01818 Encounter for other preprocedural examination: Secondary | ICD-10-CM | POA: Insufficient documentation

## 2019-11-05 LAB — COMPREHENSIVE METABOLIC PANEL
ALT: 16 U/L (ref 0–44)
AST: 18 U/L (ref 15–41)
Albumin: 4.6 g/dL (ref 3.5–5.0)
Alkaline Phosphatase: 84 U/L (ref 38–126)
Anion gap: 9 (ref 5–15)
BUN: 13 mg/dL (ref 8–23)
CO2: 29 mmol/L (ref 22–32)
Calcium: 9.6 mg/dL (ref 8.9–10.3)
Chloride: 102 mmol/L (ref 98–111)
Creatinine, Ser: 0.64 mg/dL (ref 0.44–1.00)
GFR calc Af Amer: >60 mL/min (ref >60)
GFR calc non Af Amer: >60 mL/min (ref >60)
Glucose, Bld: 85 mg/dL (ref 70–99)
Potassium: 4.6 mmol/L (ref 3.5–5.1)
Sodium: 140 mmol/L (ref 135–145)
Total Bilirubin: 0.9 mg/dL (ref 0.3–1.2)
Total Protein: 8 g/dL (ref 6.5–8.1)

## 2019-11-05 LAB — CBC
HCT: 38.9 % (ref 36.0–46.0)
Hemoglobin: 12.7 g/dL (ref 12.0–15.0)
MCH: 31.7 pg (ref 26.0–34.0)
MCHC: 32.6 g/dL (ref 30.0–36.0)
MCV: 97 fL (ref 80.0–100.0)
Platelets: 275 10*3/uL (ref 150–400)
RBC: 4.01 MIL/uL (ref 3.87–5.11)
RDW: 14.6 % (ref 11.5–15.5)
WBC: 5 10*3/uL (ref 4.0–10.5)
nRBC: 0 % (ref 0.0–0.2)

## 2019-11-05 LAB — PROTIME-INR
INR: 0.9 (ref 0.8–1.2)
Prothrombin Time: 12 seconds (ref 11.4–15.2)

## 2019-11-05 LAB — SURGICAL PCR SCREEN
MRSA, PCR: NEGATIVE
Staphylococcus aureus: NEGATIVE

## 2019-11-05 LAB — APTT: aPTT: 28 seconds (ref 24–36)

## 2019-11-05 NOTE — Progress Notes (Signed)
Cardiology Office Note    Date:  11/06/2019   ID:  Toni Myers, DOB [birth year], MRN [id number]  PCP:  Cyndi Bender, PA-C  Cardiologist: Mertie Moores, MD EPS: None  Chief Complaint  Patient presents with  . Pre-op Exam    History of Present Illness:  Toni Myers is a 69 y.o. female with history of hypertension, sinus tachycardia, COPD(nonsmoker-2nd hand smoke) and lupus.  She last saw Dr. Acie Fredrickson 06/22/2018 and was doing well on low-dose metoprolol.  She was told she could follow-up as needed.  2D echo 2017 normal LVEF 60 to 65% with grade 1 DD  Patient was placed on my schedule for cardiac clearance before undergoing hip replacement by Dr. Wynelle Link 11/13/2019.  Patient says she only feels heart race if she overexerts or gets irritated.  No recent chest pain, dyspnea, dizziness, presyncope, or edema. COPD only bothers her in winter. Was walking about 1 1/2 miles until Nov when she tripped over her neighbors cat and hurt her hip.    Past Medical History:  Diagnosis Date  . Anxiety   . Arthritis    Rheumatoid  . Asthma   . Chronic iritis, left eye   . Chronic low back pain   . Closed fracture of multiple pubic rami ([place]) 07/19/2013  . Closed fracture of pelvic rim 07/19/2013  . Cough   . Depression   . Dyspnea   . Dysrhythmia    resolved  . GERD (gastroesophageal reflux disease)   . Hypertension   . Migraine    none in several years  . Palpitations   . RLS (restless legs syndrome)   . Shingles   . Sjogrens syndrome [facility])     Past Surgical History:  Procedure Laterality Date  . BACK SURGERY     07/1998 and 03/1999, herniated disc, 2 rod and 4 screws  . COLONOSCOPY WITH PROPOFOL N/A 02/09/2015   Procedure: COLONOSCOPY WITH PROPOFOL;  Surgeon: Garlan Fair, MD;  [facility];  Service: Endoscopy;  Laterality: N/A;  . ESOPHAGOGASTRODUODENOSCOPY (EGD) WITH PROPOFOL N/A 02/09/2015   Procedure: ESOPHAGOGASTRODUODENOSCOPY (EGD) WITH PROPOFOL;  Surgeon: Garlan Fair, MD;  [facility];  Service: Endoscopy;  Laterality: N/A;  . EYE SURGERY Bilateral 2020  . TUBAL LIGATION      Current Medications: Current Meds  Medication Sig  . albuterol (PROAIR HFA) 108 (90 BASE) MCG/ACT inhaler Inhale 2 puffs into the lungs every 6 (six) hours as needed for wheezing or shortness of breath.   Marland Kitchen albuterol (PROVENTIL) (2.5 MG/3ML) 0.083% nebulizer solution Take 2.5 mg by nebulization every 4 (four) hours as needed for wheezing or shortness of breath. Reported on 10/22/2015  . budesonide-formoterol (SYMBICORT) 160-4.5 MCG/ACT inhaler Inhale 2 puffs into the lungs 2 (two) times daily.  . calcium carbonate (OSCAL) 1500 (600 Ca) MG TABS tablet Take 600 mg by mouth daily.  . celecoxib (CELEBREX) 200 MG capsule Take 200 mg by mouth daily.  . Cholecalciferol (VITAMIN D3) 50 MCG (2000 UT) TABS Take 2,000 Units by mouth daily.  . cyclobenzaprine (FLEXERIL) 10 MG tablet Take 1 tablet (10 mg total) by mouth at bedtime as needed. For spasms  . escitalopram (LEXAPRO) 10 MG tablet Take 10 mg by mouth daily.  . folic acid (FOLVITE) 1 MG tablet Take 3 mg by mouth daily.  Marland Kitchen gabapentin (NEURONTIN) 300 MG capsule Take 300-600 mg by mouth See admin instructions. Take 1 capsule (300 mg) by mouth twice daily & take 2 capsules (  600 mg) by mouth at bedtime.  . methotrexate 250 MG/10ML injection Inject 15 mg into the vein every Wednesday. Injects 0.6 ml subcutaneously every Wed (in stomach).  . metoprolol succinate (TOPROL-XL) 25 MG 24 hr tablet Take 1 tablet (25 mg total) by mouth daily.  Marland Kitchen oxyCODONE-acetaminophen (PERCOCET/ROXICET) 5-325 MG tablet Take 1 tablet by mouth every 8 (eight) hours as needed (pain.).   Marland Kitchen vitamin B-12 (CYANOCOBALAMIN) 1000 MCG tablet Take 1,000 mcg by mouth daily.  . zoledronic acid (RECLAST) 5 MG/100ML SOLN injection Inject into the vein See admin instructions. Yearly  . [DISCONTINUED] metoprolol succinate (TOPROL-XL) 25 MG 24 hr tablet TAKE 1/2  TABLET BY MOUTH EVERY DAY     Allergies:   Morphine and related and Prednisone   Social History   Socioeconomic History  . Marital status: Divorced    Spouse name: Not on file  . Number of children: Not on file  . Years of education: Not on file  . Highest education level: Not on file  Occupational History  . Occupation: retired  Tobacco Use  . Smoking status: Never Smoker  . Smokeless tobacco: Never Used  Vaping Use  . Vaping Use: Never used  Substance and Sexual Activity  . Alcohol use: Yes    Comment: 2 glasses monthly  . Drug use: No  . Sexual activity: Not on file  Other Topics Concern  . Not on file  Social History Narrative  . Not on file   Social Determinants of Health   Financial Resource Strain:   . Difficulty of Paying Living Expenses:   Food Insecurity:   . Worried About Charity fundraiser in the Last Year:   . Arboriculturist in the Last Year:   Transportation Needs:   . Film/video editor (Medical):   Marland Kitchen Lack of Transportation (Non-Medical):   Physical Activity:   . Days of Exercise per Week:   . Minutes of Exercise per Session:   Stress:   . Feeling of Stress :   Social Connections:   . Frequency of Communication with Friends and Family:   . Frequency of Social Gatherings with Friends and Family:   . Attends Religious Services:   . Active Member of Clubs or Organizations:   . Attends Archivist Meetings:   Marland Kitchen Marital Status:      Family History:  The patient's   family history includes Arthritis in her mother.   ROS:   Please see the history of present illness.    ROS All other systems reviewed and are negative.   PHYSICAL EXAM:   VS:  BP 138/76   Pulse 99   Ht 5\' 4"  (1.626 m)   Wt 139 lb 12.8 oz (63.4 kg)   SpO2 93%   BMI 24.00 kg/m   Physical Exam  GEN: Well nourished, well developed, in no acute distress  Neck: no JVD, carotid bruits, or masses Cardiac:RRR; no murmurs, rubs, or gallops  Respiratory:  clear to  auscultation bilaterally, normal work of breathing GI: soft, nontender, nondistended, + BS Ext: without cyanosis, clubbing, or edema, Good distal pulses bilaterally Neuro:  Alert and Oriented x 3 Psych: euthymic mood, full affect  Wt Readings from Last 3 Encounters:  11/06/19 139 lb 12.8 oz (63.4 kg)  11/05/19 136 lb (61.7 kg)  12/21/18 125 lb 12.8 oz (57.1 kg)      Studies/Labs Reviewed:   EKG:  EKG is  ordered today.  The ekg ordered today demonstrates  normal sinus rhythm at 99 bpm, no acute change  Recent Labs: 11/05/2019: ALT 16; BUN 13; Creatinine, Ser 0.64; Hemoglobin 12.7; Platelets 275; Potassium 4.6; Sodium 140   Lipid Panel No results found for: CHOL, TRIG, HDL, CHOLHDL, VLDL, LDLCALC, LDLDIRECT  Additional studies/ records that were reviewed today include:  2D echo 2017 Study Conclusions   - Left ventricle: The cavity size was normal. Wall thickness was    normal. Systolic function was normal. The estimated ejection    fraction was in the range of 60% to 65%. Wall motion was normal;    there were no regional wall motion abnormalities. Doppler    parameters are consistent with abnormal left ventricular    relaxation (grade 1 diastolic dysfunction).      ASSESSMENT:    1. Preoperative clearance   2. Essential hypertension   3. Sinus tachycardia   4. Chronic obstructive pulmonary disease, unspecified COPD type ([place])      PLAN:  In order of problems listed above:  Preoperative clearance before undergoing right hip arthroplasty by Dr. Wynelle Link next week.  Patient saw Dr. Acie Fredrickson last year for sinus tachycardia treated with low-dose beta-blocker.  Patient has occasional fast heart rates if she gets aggravated or over exerts herself but generally she is asymptomatic.  Heart rate at baseline is 99 bpm today.  Will increase metoprolol to 25 mg once daily.  2D echo in 2017 normal LVEF grade 1 DD.  No cardiac complaints.  Functional capacity is good at 5.72 METS and RCRI  is 0.4%.  No indication for further testing prior to undergoing hip replacement. According to the Revised Cardiac Risk Index (RCRI), her Perioperative Risk of Major Cardiac Event is (%): 0.4  Her Functional Capacity in METs is: 5.72 according to the Duke Activity Status Index (DASI).    Essential hypertension blood pressure stable  Sinus tachycardia on low-dose metoprolol-Baseline heart rate at rest 99 bpm today at rest and has occasional fast heartbeats.  Will increase to 25 mg once daily.  Follow-up with Dr. Acie Fredrickson in 4 months.  COPD secondary to secondhand smoke    Medication Adjustments/Labs and Tests Ordered: Current medicines are reviewed at length with the patient today.  Concerns regarding medicines are outlined above.  Medication changes, Labs and Tests ordered today are listed in the Patient Instructions below. Patient Instructions  Medication Instructions:  Your physician has recommended you make the following change in your medication:   INCREASE: metoprolol succinate (Toprol-XL) to 25 mg once a day  *If you need a refill on your cardiac medications before your next appointment, please call your pharmacy*   Lab Work: None  If you have labs (blood work) drawn today and your tests are completely normal, you will receive your results only by: Marland Kitchen MyChart Message (if you have MyChart) OR . A paper copy in the mail If you have any lab test that is abnormal or we need to change your treatment, we will call you to review the results.   Testing/Procedures: None   Follow-Up: Follow up with Dr. Acie Fredrickson on 03/16/20 at 11:00 AM   Other Instructions Okay to have procedure     Signed, Ermalinda Barrios, PA-C  11/06/2019 11:35 AM    [address], [place], [place]  [address] Phone: [phone]; Fax: [phone]

## 2019-11-06 ENCOUNTER — Encounter: Payer: Self-pay | Admitting: Physician Assistant

## 2019-11-06 ENCOUNTER — Ambulatory Visit: Payer: PPO | Admitting: Physician Assistant

## 2019-11-06 ENCOUNTER — Telehealth: Payer: Self-pay | Admitting: Physician Assistant

## 2019-11-06 VITALS — BP 138/76 | HR 99 | Ht 64.0 in | Wt 139.8 lb

## 2019-11-06 DIAGNOSIS — J449 Chronic obstructive pulmonary disease, unspecified: Secondary | ICD-10-CM | POA: Diagnosis not present

## 2019-11-06 DIAGNOSIS — I10 Essential (primary) hypertension: Secondary | ICD-10-CM | POA: Diagnosis not present

## 2019-11-06 DIAGNOSIS — R000 Tachycardia, unspecified: Secondary | ICD-10-CM | POA: Diagnosis not present

## 2019-11-06 DIAGNOSIS — Z01818 Encounter for other preprocedural examination: Secondary | ICD-10-CM | POA: Diagnosis not present

## 2019-11-06 MED ORDER — METOPROLOL SUCCINATE ER 25 MG PO TB24: 25.0000 mg | ORAL_TABLET | Freq: Every day | ORAL | 3 refills | Status: DC

## 2019-11-06 NOTE — Patient Instructions (Signed)
Medication Instructions:  Your physician has recommended you make the following change in your medication:   INCREASE: metoprolol succinate (Toprol-XL) to 25 mg once a day  *If you need a refill on your cardiac medications before your next appointment, please call your pharmacy*   Lab Work: None  If you have labs (blood work) drawn today and your tests are completely normal, you will receive your results only by: Marland Kitchen MyChart Message (if you have MyChart) OR . A paper copy in the mail If you have any lab test that is abnormal or we need to change your treatment, we will call you to review the results.   Testing/Procedures: None   Follow-Up: Follow up with Dr. Acie Fredrickson on 03/16/20 at 11:00 AM   Other Instructions Okay to have procedure

## 2019-11-06 NOTE — Telephone Encounter (Signed)
New Message  Pt's daughter called and said that she came with her mom to doctor's appt this morning and she was throwing some trash and she made a mistake and threw away a purchase from [employer] in a white or brown gift size bag. Inside the bag was a ropher and tie dye. Wants to know if a custodian that does the trash could be reached to check for items.

## 2019-11-06 NOTE — Telephone Encounter (Signed)
Called and made the patient aware that I checked all of the trash upstairs and downstairs and outside of the building and did not see the gift bag. Let patient know that our cleaning team has already emptied the trash from earlier. She verbalized understanding and thanked me for the call.

## 2019-11-07 NOTE — Anesthesia Preprocedure Evaluation (Addendum)
Anesthesia Evaluation  Patient identified by MRN, date of birth, ID band Patient awake    Reviewed: Allergy & Precautions, H&P , NPO status , Patient's Chart, lab work & pertinent test results, reviewed documented beta blocker date and time   Airway Mallampati: II  TM Distance: >3 FB Neck ROM: Full    Dental no notable dental hx. (+) Teeth Intact, Dental Advisory Given   Pulmonary asthma , COPD,  COPD inhaler,    Pulmonary exam normal breath sounds clear to auscultation       Cardiovascular hypertension, Pt. on medications and Pt. on home beta blockers + dysrhythmias  Rhythm:Regular Rate:Normal     Neuro/Psych  Headaches, Anxiety Depression    GI/Hepatic Neg liver ROS, GERD  Controlled,  Endo/Other  negative endocrine ROS  Renal/GU negative Renal ROS  negative genitourinary   Musculoskeletal  (+) Arthritis , Osteoarthritis,    Abdominal   Peds  Hematology negative hematology ROS (+)   Anesthesia Other Findings   Reproductive/Obstetrics negative OB ROS                            Anesthesia Physical Anesthesia Plan  ASA: II  Anesthesia Plan: Spinal   Post-op Pain Management:    Induction: Intravenous  PONV Risk Score and Plan: 3 and Ondansetron, Dexamethasone and Propofol infusion  Airway Management Planned: Simple Face Mask  Additional Equipment:   Intra-op Plan:   Post-operative Plan: Extubation in OR  Informed Consent: I have reviewed the patients History and Physical, chart, labs and discussed the procedure including the risks, benefits and alternatives for the proposed anesthesia with the patient or authorized representative who has indicated his/her understanding and acceptance.     Dental advisory given  Plan Discussed with: CRNA  Anesthesia Plan Comments: (See PAT note 11/05/2019, Konrad Felix, PA-C)       Anesthesia Quick Evaluation

## 2019-11-07 NOTE — Progress Notes (Signed)
Anesthesia Chart Review   Case: [id number] Date/Time: 11/13/19 1425   Procedure: TOTAL HIP ARTHROPLASTY ANTERIOR APPROACH (Right Hip) - 128min   Anesthesia type: Choice   Pre-op diagnosis: right hip osteoarthritis   [facility]   Surgeons: Gaynelle Arabian, MD      DISCUSSION:69 y.o. never smoker with h/o HTN, asthma, GERD, COPD, lupus, right hip OA scheduled for above procedure 11/13/2019 with Dr. Gaynelle Arabian.   Pt last seen by cardiology 11/06/2019.  Per OV note, "Preoperative clearance before undergoing right hip arthroplasty by Dr. Wynelle Link next week.  Patient saw Dr. Acie Fredrickson last year for sinus tachycardia treated with low-dose beta-blocker.  Patient has occasional fast heart rates if she gets aggravated or over exerts herself but generally she is asymptomatic.  Heart rate at baseline is 99 bpm today.  Will increase metoprolol to 25 mg once daily.  2D echo in 2017 normal LVEF grade 1 DD.  No cardiac complaints.  Functional capacity is good at 5.72 METS and RCRI is 0.4%.  No indication for further testing prior to undergoing hip replacement. According to the Revised Cardiac Risk Index (RCRI), her Perioperative Risk of Major Cardiac Event is (%): 0.4 Her Functional Capacity in METs is: 5.72 according to the Duke Activity Status Index (DASI)."  Anticipate pt can proceed with planned procedure barring acute status change.   VS: BP 136/86   Pulse 94   Temp 36.7 C (Oral)   Resp 16   Ht 5\' 4"  (1.626 m)   Wt 61.7 kg   SpO2 100%   BMI 23.34 kg/m   PROVIDERS: Cyndi Bender, PA-C is PCP   Mertie Moores, MD is Cardiologist  LABS: Labs reviewed: Acceptable for surgery. (all labs ordered are listed, but only abnormal results are displayed)  Labs Reviewed  SURGICAL PCR SCREEN  APTT  CBC  COMPREHENSIVE METABOLIC PANEL  PROTIME-INR  TYPE AND SCREEN     IMAGES:   EKG: 11/06/2019: normal sinus rhythm at 99 bpm, no acute change  CV: Echo 10/13/2015 Study Conclusions    - Left ventricle: The cavity size was normal. Wall thickness was  normal. Systolic function was normal. The estimated ejection  fraction was in the range of 60% to 65%. Wall motion was normal;  there were no regional wall motion abnormalities. Doppler  parameters are consistent with abnormal left ventricular  relaxation (grade 1 diastolic dysfunction).  Past Medical History:  Diagnosis Date  . Anxiety   . Arthritis    Rheumatoid  . Asthma   . Chronic iritis, left eye   . Chronic low back pain   . Closed fracture of multiple pubic rami ([place]) 07/19/2013  . Closed fracture of pelvic rim 07/19/2013  . Cough   . Depression   . Dyspnea   . Dysrhythmia    resolved  . GERD (gastroesophageal reflux disease)   . Hypertension   . Migraine    none in several years  . Palpitations   . RLS (restless legs syndrome)   . Shingles   . Sjogrens syndrome [facility])     Past Surgical History:  Procedure Laterality Date  . BACK SURGERY     07/1998 and 03/1999, herniated disc, 2 rod and 4 screws  . COLONOSCOPY WITH PROPOFOL N/A 02/09/2015   Procedure: COLONOSCOPY WITH PROPOFOL;  Surgeon: Garlan Fair, MD;  [facility];  Service: Endoscopy;  Laterality: N/A;  . ESOPHAGOGASTRODUODENOSCOPY (EGD) WITH PROPOFOL N/A 02/09/2015   Procedure: ESOPHAGOGASTRODUODENOSCOPY (EGD) WITH PROPOFOL;  Surgeon: Garlan Fair, MD;  Location: [facility];  Service: Endoscopy;  Laterality: N/A;  . EYE SURGERY Bilateral 2020  . TUBAL LIGATION      MEDICATIONS: . albuterol (PROAIR HFA) 108 (90 BASE) MCG/ACT inhaler  . albuterol (PROVENTIL) (2.5 MG/3ML) 0.083% nebulizer solution  . budesonide-formoterol (SYMBICORT) 160-4.5 MCG/ACT inhaler  . calcium carbonate (OSCAL) 1500 (600 Ca) MG TABS tablet  . celecoxib (CELEBREX) 200 MG capsule  . Cholecalciferol (VITAMIN D3) 50 MCG (2000 UT) TABS  . cyclobenzaprine (FLEXERIL) 10 MG tablet  . escitalopram (LEXAPRO) 10 MG tablet  . folic acid (FOLVITE)  1 MG tablet  . gabapentin (NEURONTIN) 300 MG capsule  . methotrexate 250 MG/10ML injection  . metoprolol succinate (TOPROL-XL) 25 MG 24 hr tablet  . oxyCODONE-acetaminophen (PERCOCET/ROXICET) 5-325 MG tablet  . vitamin B-12 (CYANOCOBALAMIN) 1000 MCG tablet  . zoledronic acid (RECLAST) 5 MG/100ML SOLN injection   No current facility-administered medications for this encounter.    Maia Plan [facility] Pre-Surgical Testing [phone] 11/07/19  1:49 PM

## 2019-11-09 ENCOUNTER — Other Ambulatory Visit (HOSPITAL_COMMUNITY)
Admission: RE | Admit: 2019-11-09 | Discharge: 2019-11-09 | Disposition: A | Discharge status: Home / Self Care | Payer: PPO | Source: Ambulatory Visit | Attending: Orthopedic Surgery | Admitting: Orthopedic Surgery

## 2019-11-09 DIAGNOSIS — Z01812 Encounter for preprocedural laboratory examination: Secondary | ICD-10-CM | POA: Insufficient documentation

## 2019-11-09 DIAGNOSIS — Z20822 Contact with and (suspected) exposure to covid-19: Secondary | ICD-10-CM | POA: Insufficient documentation

## 2019-11-09 LAB — SARS CORONAVIRUS 2 (TAT 6-24 HRS): SARS Coronavirus 2: NEGATIVE

## 2019-11-13 ENCOUNTER — Ambulatory Visit (HOSPITAL_COMMUNITY): Payer: PPO | Admitting: Physician Assistant

## 2019-11-13 ENCOUNTER — Ambulatory Visit (HOSPITAL_COMMUNITY): Payer: PPO

## 2019-11-13 ENCOUNTER — Other Ambulatory Visit: Payer: Self-pay

## 2019-11-13 ENCOUNTER — Ambulatory Visit (HOSPITAL_COMMUNITY): Payer: PPO | Admitting: Certified Registered Nurse Anesthetist

## 2019-11-13 ENCOUNTER — Inpatient Hospital Stay (HOSPITAL_COMMUNITY)
Admission: RE | Admit: 2019-11-13 | Discharge: 2019-11-16 | DRG: 470 | Disposition: A | Discharge status: Home Health | Payer: PPO | Source: Other Acute Inpatient Hospital | Attending: Orthopedic Surgery | Admitting: Orthopedic Surgery

## 2019-11-13 ENCOUNTER — Encounter (HOSPITAL_COMMUNITY): Payer: Self-pay | Admitting: Orthopedic Surgery

## 2019-11-13 ENCOUNTER — Encounter (HOSPITAL_COMMUNITY)
Admission: RE | Disposition: A | Discharge status: Home Health | Payer: Self-pay | Source: Other Acute Inpatient Hospital | Attending: Orthopedic Surgery

## 2019-11-13 DIAGNOSIS — Z8261 Family history of arthritis: Secondary | ICD-10-CM | POA: Diagnosis not present

## 2019-11-13 DIAGNOSIS — I10 Essential (primary) hypertension: Secondary | ICD-10-CM | POA: Diagnosis present

## 2019-11-13 DIAGNOSIS — M169 Osteoarthritis of hip, unspecified: Secondary | ICD-10-CM | POA: Diagnosis present

## 2019-11-13 DIAGNOSIS — Z96641 Presence of right artificial hip joint: Secondary | ICD-10-CM | POA: Diagnosis not present

## 2019-11-13 DIAGNOSIS — Z96649 Presence of unspecified artificial hip joint: Secondary | ICD-10-CM

## 2019-11-13 DIAGNOSIS — N182 Chronic kidney disease, stage 2 (mild): Secondary | ICD-10-CM | POA: Diagnosis not present

## 2019-11-13 DIAGNOSIS — I129 Hypertensive chronic kidney disease with stage 1 through stage 4 chronic kidney disease, or unspecified chronic kidney disease: Secondary | ICD-10-CM | POA: Diagnosis not present

## 2019-11-13 DIAGNOSIS — Z419 Encounter for procedure for purposes other than remedying health state, unspecified: Secondary | ICD-10-CM

## 2019-11-13 DIAGNOSIS — J449 Chronic obstructive pulmonary disease, unspecified: Secondary | ICD-10-CM | POA: Diagnosis present

## 2019-11-13 DIAGNOSIS — M1611 Unilateral primary osteoarthritis, right hip: Principal | ICD-10-CM | POA: Diagnosis present

## 2019-11-13 DIAGNOSIS — Z471 Aftercare following joint replacement surgery: Secondary | ICD-10-CM | POA: Diagnosis not present

## 2019-11-13 HISTORY — PX: TOTAL HIP ARTHROPLASTY: SHX124

## 2019-11-13 LAB — TYPE AND SCREEN
ABO/RH(D): A POS
Antibody Screen: NEGATIVE

## 2019-11-13 SURGERY — ARTHROPLASTY, HIP, TOTAL, ANTERIOR APPROACH
Anesthesia: Spinal | Site: Hip | Laterality: Right

## 2019-11-13 MED ORDER — METOPROLOL SUCCINATE ER 25 MG PO TB24
25.0000 mg | ORAL_TABLET | Freq: Every day | ORAL | Status: DC
  Administered 2019-11-14 – 2019-11-16 (×3): 25 mg via ORAL
  Filled 2019-11-13 (×3): qty 1

## 2019-11-13 MED ORDER — MAGNESIUM CITRATE PO SOLN: 1.0000 | Freq: Once | ORAL | Status: DC | PRN

## 2019-11-13 MED ORDER — 0.9 % SODIUM CHLORIDE (POUR BTL) OPTIME
TOPICAL | Status: DC | PRN
  Administered 2019-11-13: 1000 mL

## 2019-11-13 MED ORDER — BUPIVACAINE HCL 0.25 % IJ SOLN
INTRAMUSCULAR | Status: AC
  Filled 2019-11-13: qty 1

## 2019-11-13 MED ORDER — ONDANSETRON HCL 4 MG/2ML IJ SOLN: 4.0000 mg | Freq: Four times a day (QID) | INTRAMUSCULAR | Status: DC | PRN

## 2019-11-13 MED ORDER — METOPROLOL SUCCINATE ER 25 MG PO TB24
25.0000 mg | ORAL_TABLET | ORAL | Status: AC
  Administered 2019-11-13: 25 mg via ORAL
  Filled 2019-11-13 (×3): qty 1

## 2019-11-13 MED ORDER — HYDROMORPHONE HCL 1 MG/ML IJ SOLN: 0.5000 mg | INTRAMUSCULAR | Status: DC | PRN

## 2019-11-13 MED ORDER — POLYETHYLENE GLYCOL 3350 17 G PO PACK: 17.0000 g | PACK | Freq: Every day | ORAL | Status: DC | PRN

## 2019-11-13 MED ORDER — MIDAZOLAM HCL 2 MG/2ML IJ SOLN
INTRAMUSCULAR | Status: AC
  Filled 2019-11-13: qty 2

## 2019-11-13 MED ORDER — MIDAZOLAM HCL 2 MG/2ML IJ SOLN
INTRAMUSCULAR | Status: DC | PRN
  Administered 2019-11-13: 2 mg via INTRAVENOUS

## 2019-11-13 MED ORDER — GABAPENTIN 300 MG PO CAPS
300.0000 mg | ORAL_CAPSULE | Freq: Two times a day (BID) | ORAL | Status: DC
  Administered 2019-11-13 – 2019-11-16 (×7): 300 mg via ORAL
  Filled 2019-11-13 (×7): qty 1

## 2019-11-13 MED ORDER — ESCITALOPRAM OXALATE 10 MG PO TABS
10.0000 mg | ORAL_TABLET | Freq: Every day | ORAL | Status: DC
  Administered 2019-11-14 – 2019-11-16 (×3): 10 mg via ORAL
  Filled 2019-11-13 (×3): qty 1

## 2019-11-13 MED ORDER — WATER FOR IRRIGATION, STERILE IR SOLN
Status: DC | PRN
  Administered 2019-11-13 (×2): 1000 mL

## 2019-11-13 MED ORDER — METHOCARBAMOL 500 MG IVPB - SIMPLE MED
INTRAVENOUS | Status: AC
  Filled 2019-11-13: qty 50

## 2019-11-13 MED ORDER — GABAPENTIN 300 MG PO CAPS
600.0000 mg | ORAL_CAPSULE | Freq: Every day | ORAL | Status: DC
  Administered 2019-11-13 – 2019-11-15 (×3): 600 mg via ORAL
  Filled 2019-11-13 (×3): qty 2

## 2019-11-13 MED ORDER — CEFAZOLIN SODIUM-DEXTROSE 2-4 GM/100ML-% IV SOLN
2.0000 g | INTRAVENOUS | Status: AC
  Administered 2019-11-13: 2 g via INTRAVENOUS
  Filled 2019-11-13: qty 100

## 2019-11-13 MED ORDER — ACETAMINOPHEN 325 MG PO TABS: 325.0000 mg | ORAL_TABLET | Freq: Four times a day (QID) | ORAL | Status: DC | PRN

## 2019-11-13 MED ORDER — PROPOFOL 500 MG/50ML IV EMUL
INTRAVENOUS | Status: DC | PRN
  Administered 2019-11-13: 125 ug/kg/min via INTRAVENOUS
  Administered 2019-11-13: 50 mg via INTRAVENOUS
  Administered 2019-11-13: 100 mg via INTRAVENOUS
  Administered 2019-11-13: 125 mg via INTRAVENOUS
  Administered 2019-11-13: 75 mg via INTRAVENOUS

## 2019-11-13 MED ORDER — TRANEXAMIC ACID-NACL 1000-0.7 MG/100ML-% IV SOLN
1000.0000 mg | INTRAVENOUS | Status: AC
  Administered 2019-11-13: 1000 mg via INTRAVENOUS
  Filled 2019-11-13: qty 100

## 2019-11-13 MED ORDER — ALBUTEROL SULFATE (2.5 MG/3ML) 0.083% IN NEBU: 2.5000 mg | INHALATION_SOLUTION | Freq: Four times a day (QID) | RESPIRATORY_TRACT | Status: DC | PRN

## 2019-11-13 MED ORDER — METOCLOPRAMIDE HCL 5 MG/ML IJ SOLN: 5.0000 mg | Freq: Three times a day (TID) | INTRAMUSCULAR | Status: DC | PRN

## 2019-11-13 MED ORDER — BUPIVACAINE IN DEXTROSE 0.75-8.25 % IT SOLN
INTRATHECAL | Status: DC | PRN
  Administered 2019-11-13: 1.6 mL via INTRATHECAL

## 2019-11-13 MED ORDER — HYDROMORPHONE HCL 1 MG/ML IJ SOLN
INTRAMUSCULAR | Status: AC
  Filled 2019-11-13: qty 1

## 2019-11-13 MED ORDER — METHOCARBAMOL 500 MG PO TABS
500.0000 mg | ORAL_TABLET | Freq: Four times a day (QID) | ORAL | Status: DC | PRN
  Administered 2019-11-14 – 2019-11-16 (×5): 500 mg via ORAL
  Filled 2019-11-13 (×6): qty 1

## 2019-11-13 MED ORDER — OXYCODONE HCL 5 MG PO TABS
5.0000 mg | ORAL_TABLET | ORAL | Status: DC | PRN
  Administered 2019-11-13 – 2019-11-16 (×10): 5 mg via ORAL
  Filled 2019-11-13 (×11): qty 1

## 2019-11-13 MED ORDER — ONDANSETRON HCL 4 MG PO TABS: 4.0000 mg | ORAL_TABLET | Freq: Four times a day (QID) | ORAL | Status: DC | PRN

## 2019-11-13 MED ORDER — ORAL CARE MOUTH RINSE: 15.0000 mL | Freq: Once | OROMUCOSAL | Status: AC

## 2019-11-13 MED ORDER — HYDROMORPHONE HCL 1 MG/ML IJ SOLN
0.2500 mg | INTRAMUSCULAR | Status: DC | PRN
  Administered 2019-11-13 (×2): 0.5 mg via INTRAVENOUS

## 2019-11-13 MED ORDER — ASPIRIN EC 325 MG PO TBEC
325.0000 mg | DELAYED_RELEASE_TABLET | Freq: Two times a day (BID) | ORAL | Status: DC
  Administered 2019-11-14 – 2019-11-16 (×5): 325 mg via ORAL
  Filled 2019-11-13 (×5): qty 1

## 2019-11-13 MED ORDER — PHENOL 1.4 % MT LIQD: 1.0000 | OROMUCOSAL | Status: DC | PRN

## 2019-11-13 MED ORDER — MOMETASONE FURO-FORMOTEROL FUM 200-5 MCG/ACT IN AERO
2.0000 | INHALATION_SPRAY | Freq: Two times a day (BID) | RESPIRATORY_TRACT | Status: DC
  Administered 2019-11-13 – 2019-11-16 (×6): 2 via RESPIRATORY_TRACT
  Filled 2019-11-13: qty 8.8

## 2019-11-13 MED ORDER — LACTATED RINGERS IV SOLN: INTRAVENOUS | Status: DC

## 2019-11-13 MED ORDER — MENTHOL 3 MG MT LOZG: 1.0000 | LOZENGE | OROMUCOSAL | Status: DC | PRN

## 2019-11-13 MED ORDER — ACETAMINOPHEN 10 MG/ML IV SOLN
1000.0000 mg | Freq: Four times a day (QID) | INTRAVENOUS | Status: DC
  Administered 2019-11-13: 1000 mg via INTRAVENOUS
  Filled 2019-11-13: qty 100

## 2019-11-13 MED ORDER — BISACODYL 10 MG RE SUPP: 10.0000 mg | Freq: Every day | RECTAL | Status: DC | PRN

## 2019-11-13 MED ORDER — ONDANSETRON HCL 4 MG/2ML IJ SOLN
INTRAMUSCULAR | Status: DC | PRN
  Administered 2019-11-13: 4 mg via INTRAVENOUS

## 2019-11-13 MED ORDER — LORATADINE 10 MG PO TABS
10.0000 mg | ORAL_TABLET | Freq: Every day | ORAL | Status: DC
  Administered 2019-11-14 – 2019-11-16 (×3): 10 mg via ORAL
  Filled 2019-11-13 (×3): qty 1

## 2019-11-13 MED ORDER — CEFAZOLIN SODIUM-DEXTROSE 2-4 GM/100ML-% IV SOLN
2.0000 g | Freq: Four times a day (QID) | INTRAVENOUS | Status: AC
  Administered 2019-11-13 – 2019-11-14 (×2): 2 g via INTRAVENOUS
  Filled 2019-11-13 (×2): qty 100

## 2019-11-13 MED ORDER — DEXAMETHASONE SODIUM PHOSPHATE 10 MG/ML IJ SOLN: 8.0000 mg | Freq: Once | INTRAMUSCULAR | Status: DC

## 2019-11-13 MED ORDER — LIDOCAINE 2% (20 MG/ML) 5 ML SYRINGE
INTRAMUSCULAR | Status: DC | PRN
  Administered 2019-11-13: 40 mg via INTRAVENOUS

## 2019-11-13 MED ORDER — SODIUM CHLORIDE 0.9 % IV SOLN: INTRAVENOUS | Status: DC

## 2019-11-13 MED ORDER — METOCLOPRAMIDE HCL 5 MG PO TABS: 5.0000 mg | ORAL_TABLET | Freq: Three times a day (TID) | ORAL | Status: DC | PRN

## 2019-11-13 MED ORDER — CHLORHEXIDINE GLUCONATE 0.12 % MT SOLN
15.0000 mL | Freq: Once | OROMUCOSAL | Status: AC
  Administered 2019-11-13: 15 mL via OROMUCOSAL

## 2019-11-13 MED ORDER — METHOCARBAMOL 500 MG IVPB - SIMPLE MED
500.0000 mg | Freq: Four times a day (QID) | INTRAVENOUS | Status: DC | PRN
  Administered 2019-11-13: 500 mg via INTRAVENOUS
  Filled 2019-11-13: qty 50

## 2019-11-13 MED ORDER — BUPIVACAINE HCL 0.25 % IJ SOLN
INTRAMUSCULAR | Status: DC | PRN
  Administered 2019-11-13: 30 mL

## 2019-11-13 MED ORDER — PHENYLEPHRINE 40 MCG/ML (10ML) SYRINGE FOR IV PUSH (FOR BLOOD PRESSURE SUPPORT)
PREFILLED_SYRINGE | INTRAVENOUS | Status: DC | PRN
  Administered 2019-11-13 (×2): 80 ug via INTRAVENOUS

## 2019-11-13 MED ORDER — POVIDONE-IODINE 10 % EX SWAB
2.0000 "application " | Freq: Once | CUTANEOUS | Status: AC
  Administered 2019-11-13: 2 via TOPICAL

## 2019-11-13 MED ORDER — DOCUSATE SODIUM 100 MG PO CAPS
100.0000 mg | ORAL_CAPSULE | Freq: Two times a day (BID) | ORAL | Status: DC
  Administered 2019-11-13 – 2019-11-16 (×6): 100 mg via ORAL
  Filled 2019-11-13 (×6): qty 1

## 2019-11-13 SURGICAL SUPPLY — 49 items
BAG DECANTER FOR FLEXI CONT (MISCELLANEOUS) IMPLANT
BAG SPEC THK2 15X12 ZIP CLS (MISCELLANEOUS)
BAG ZIPLOCK 12X15 (MISCELLANEOUS) IMPLANT
BLADE SAG 18X100X1.27 (BLADE) ×2 IMPLANT
CLSR STERI-STRIP ANTIMIC 1/2X4 (GAUZE/BANDAGES/DRESSINGS) ×1 IMPLANT
COVER PERINEAL POST (MISCELLANEOUS) ×2 IMPLANT
COVER SURGICAL LIGHT HANDLE (MISCELLANEOUS) ×2 IMPLANT
COVER WAND RF STERILE (DRAPES) IMPLANT
CUP ACET PINNACLE SECTR 50MM (Hips) IMPLANT
DECANTER SPIKE VIAL GLASS SM (MISCELLANEOUS) ×2 IMPLANT
DRAPE STERI IOBAN 125X83 (DRAPES) ×2 IMPLANT
DRAPE U-SHAPE 47X51 STRL (DRAPES) ×4 IMPLANT
DRSG ADAPTIC 3X8 NADH LF (GAUZE/BANDAGES/DRESSINGS) ×2 IMPLANT
DRSG AQUACEL AG ADV 3.5X10 (GAUZE/BANDAGES/DRESSINGS) ×2 IMPLANT
DURAPREP 26ML APPLICATOR (WOUND CARE) ×2 IMPLANT
ELECT REM PT RETURN 15FT ADLT (MISCELLANEOUS) ×2 IMPLANT
EVACUATOR 1/8 PVC DRAIN (DRAIN) IMPLANT
FEM STEM 12/14 TAPER SZ 4 HIP (Orthopedic Implant) ×2 IMPLANT
FEMORAL STEM 12/14 TPR SZ4 HIP (Orthopedic Implant) IMPLANT
GLOVE BIO SURGEON STRL SZ 6 (GLOVE) IMPLANT
GLOVE BIO SURGEON STRL SZ7 (GLOVE) IMPLANT
GLOVE BIO SURGEON STRL SZ8 (GLOVE) ×2 IMPLANT
GLOVE BIOGEL PI IND STRL 6.5 (GLOVE) IMPLANT
GLOVE BIOGEL PI IND STRL 7.0 (GLOVE) IMPLANT
GLOVE BIOGEL PI IND STRL 8 (GLOVE) ×1 IMPLANT
GLOVE BIOGEL PI INDICATOR 6.5 (GLOVE)
GLOVE BIOGEL PI INDICATOR 7.0 (GLOVE)
GLOVE BIOGEL PI INDICATOR 8 (GLOVE) ×1
GOWN STRL REUS W/TWL LRG LVL3 (GOWN DISPOSABLE) ×2 IMPLANT
GOWN STRL REUS W/TWL XL LVL3 (GOWN DISPOSABLE) IMPLANT
HEAD FEMORAL 32 CERAMIC (Hips) ×1 IMPLANT
HOLDER FOLEY CATH W/STRAP (MISCELLANEOUS) ×2 IMPLANT
KIT TURNOVER KIT A (KITS) IMPLANT
LINER MARATHON 32 50 (Hips) ×1 IMPLANT
MANIFOLD NEPTUNE II (INSTRUMENTS) ×2 IMPLANT
PACK ANTERIOR HIP CUSTOM (KITS) ×2 IMPLANT
PENCIL SMOKE EVACUATOR COATED (MISCELLANEOUS) ×2 IMPLANT
PINNACLE SECTOR CUP 50MM (Hips) ×2 IMPLANT
SPONGE SURGIFOAM ABS GEL 100 (HEMOSTASIS) ×1 IMPLANT
STRIP CLOSURE SKIN 1/2X4 (GAUZE/BANDAGES/DRESSINGS) ×2 IMPLANT
SUT ETHIBOND NAB CT1 #1 30IN (SUTURE) ×2 IMPLANT
SUT MNCRL AB 4-0 PS2 18 (SUTURE) ×2 IMPLANT
SUT STRATAFIX 0 PDS 27 VIOLET (SUTURE) ×2
SUT VIC AB 2-0 CT1 27 (SUTURE) ×4
SUT VIC AB 2-0 CT1 TAPERPNT 27 (SUTURE) ×2 IMPLANT
SUTURE STRATFX 0 PDS 27 VIOLET (SUTURE) ×1 IMPLANT
SYR 50ML LL SCALE MARK (SYRINGE) IMPLANT
TRAY FOLEY MTR SLVR 16FR STAT (SET/KITS/TRAYS/PACK) ×2 IMPLANT
YANKAUER SUCT BULB TIP 10FT TU (MISCELLANEOUS) ×2 IMPLANT

## 2019-11-13 NOTE — Anesthesia Procedure Notes (Signed)
Spinal  Patient location during procedure: OR Start time: 11/13/2019 1:15 PM End time: 11/13/2019 1:17 PM Staffing Performed: anesthesiologist  Anesthesiologist: Roderic Palau, MD Preanesthetic Checklist Completed: patient identified, IV checked, risks and benefits discussed, surgical consent, monitors and equipment checked, pre-op evaluation and timeout performed Spinal Block Patient position: sitting Prep: DuraPrep Patient monitoring: cardiac monitor, continuous pulse ox and blood pressure Approach: midline Location: L3-4 Injection technique: single-shot Needle Needle type: Pencan  Needle gauge: 24 G Needle length: 9 cm Assessment Sensory level: T8 Additional Notes Functioning IV was confirmed and monitors were applied. Sterile prep and drape, including hand hygiene and sterile gloves were used. The patient was positioned and the spine was prepped. The skin was anesthetized with lidocaine.  Free flow of clear CSF was obtained prior to injecting local anesthetic into the CSF.  The spinal needle aspirated freely following injection.  The needle was carefully withdrawn.  The patient tolerated the procedure well. CRNA attemps x 2 prior to my attempt.

## 2019-11-13 NOTE — Evaluation (Signed)
Physical Therapy Evaluation Patient Details Name: Toni Myers MRN: [id number] DOB: [birth year] Today's Date: 11/13/2019   History of Present Illness  pt s/p RDATHA 11/13/2019 with history of back surgery for scoliosis in 2000 with rods in place.  Clinical Impression  Pt is s/p R DATHA resulting in the deficits listed below (see PT Problem List). Pt will benefit from skilled PT to increase their independence and safety with mobility to allow dischargehome with husband assisting. 3 STE and tolerated ambulation in room today with some burning in R anterior thigh area. Predict she will progress nicely tomorrow with 1 or 2 visits .      Follow Up Recommendations Follow surgeon's recommendation for DC plan and follow-up therapies (no therapy f/u at this time)    Equipment Recommendations       Recommendations for Other Services       Precautions / Restrictions Precautions Precautions: None Restrictions Weight Bearing Restrictions: No      Mobility  Bed Mobility Overal bed mobility: Needs Assistance Bed Mobility: Supine to Sit;Sit to Supine     Supine to sit: Min assist        Transfers Overall transfer level: Needs assistance Equipment used: Rolling walker (2 wheeled) Transfers: Sit to/from Stand Sit to Stand: Min assist         General transfer comment: cues for RW safety  Ambulation/Gait Ambulation/Gait assistance: Min assist Gait Distance (Feet): 15 Feet Assistive device: Rolling walker (2 wheeled) Gait Pattern/deviations: Step-to pattern     General Gait Details: guarded due to pain  Stairs            Wheelchair Mobility    Modified Rankin (Stroke Patients Only)       Balance Overall balance assessment: Mild deficits observed, not formally tested                                           Pertinent Vitals/Pain Pain Assessment: 0-10 Pain Score: 5  Pain Location: R anterior thigh, burning Pain Descriptors / Indicators:  Burning;Sharp Pain Intervention(s): Limited activity within patient's tolerance;Ice applied    Home Living Family/patient expects to be discharged to:: Private residence Living Arrangements: Spouse/significant other Available Help at Discharge: Family Type of Home: House Home Access: Stairs to enter Entrance Stairs-Rails: Can reach both Entrance Stairs-Number of Steps: 3 Home Layout: One level Home Equipment: [place] - 2 wheels;Cane - single point      Prior Function Level of Independence: Independent with assistive device(s)         Comments: has been having to use cane since NOV since she had a fall when a cat ogt tangled in her step     Hand Dominance        Extremity/Trunk Assessment        Lower Extremity Assessment Lower Extremity Assessment: Overall WFL for tasks assessed       Communication   Communication: No difficulties  Cognition Arousal/Alertness: Awake/alert Behavior During Therapy: WFL for tasks assessed/performed Overall Cognitive Status: Within Functional Limits for tasks assessed                                        General Comments      Exercises Total Joint Exercises Ankle Circles/Pumps: AROM;Both;10 reps;Supine Quad Sets: AROM;Right;10 reps;Supine Heel Slides:  AAROM;Supine;Right;5 reps Hip ABduction/ADduction: AAROM;Supine;Right;5 reps   Assessment/Plan    PT Assessment Patient needs continued PT services  PT Problem List Decreased strength;Decreased activity tolerance;Decreased mobility       PT Treatment Interventions DME instruction;Therapeutic exercise;Gait training;Stair training;Functional mobility training;Therapeutic activities;Patient/family education    PT Goals (Current goals can be found in the Care Plan section)  Acute Rehab PT Goals Patient Stated Goal: I want to get back to walking in the gardens here in [place] PT Goal Formulation: With patient Time For Goal Achievement: 11/20/19 Potential  to Achieve Goals: Good    Frequency 7X/week   Barriers to discharge        Co-evaluation               AM-PAC PT "6 Clicks" Mobility  Outcome Measure Help needed turning from your back to your side while in a flat bed without using bedrails?: A Little Help needed moving from lying on your back to sitting on the side of a flat bed without using bedrails?: A Little Help needed moving to and from a bed to a chair (including a wheelchair)?: A Little Help needed standing up from a chair using your arms (e.g., wheelchair or bedside chair)?: A Little Help needed to walk in hospital room?: A Little Help needed climbing 3-5 steps with a railing? : A Little 6 Click Score: 18    End of Session Equipment Utilized During Treatment: Gait belt Activity Tolerance: Patient tolerated treatment well Patient left: in chair;with call bell/phone within reach;with family/visitor present (educated patient and husband to not get up alone, both agreed understanding. Was difficult to reach the plug for the chair alarm at this time) Nurse Communication: Mobility status PT Visit Diagnosis: Other abnormalities of gait and mobility (R26.89)    Time: 2336-1224 PT Time Calculation (min) (ACUTE ONLY): 31 min   Charges:   PT Evaluation $PT Eval Low Complexity: 1 Low PT Treatments $Gait Training: 8-22 mins        [name], PT, MPT Acute Rehabilitation Services Office: [phone] Pager: [phone] 11/13/2019   [name] 11/13/2019, 6:58 PM

## 2019-11-13 NOTE — Transfer of Care (Signed)
Immediate Anesthesia Transfer of Care Note  Patient: Toni Myers  Procedure(s) Performed: Procedure(s) with comments: TOTAL HIP ARTHROPLASTY ANTERIOR APPROACH (Right) - 167min  Patient Location: PACU  Anesthesia Type:Spinal  Level of Consciousness: awake, alert  and oriented  Airway & Oxygen Therapy: Patient Spontanous Breathing  Post-op Assessment: Report given to RN and Post -op Vital signs reviewed and stable  Post vital signs: Reviewed and stable  Last Vitals:  Vitals:   11/13/19 1107  BP: (!) 136/93  Pulse: 92  Resp: 20  Temp: 37 C  SpO2: 121%    Complications: No apparent anesthesia complications

## 2019-11-13 NOTE — Anesthesia Postprocedure Evaluation (Signed)
Anesthesia Post Note  Patient: Toni Myers  Procedure(s) Performed: TOTAL HIP ARTHROPLASTY ANTERIOR APPROACH (Right Hip)     Patient location during evaluation: PACU Anesthesia Type: Spinal Level of consciousness: oriented and awake and alert Pain management: pain level controlled Vital Signs Assessment: post-procedure vital signs reviewed and stable Respiratory status: spontaneous breathing, respiratory function stable and patient connected to nasal cannula oxygen Cardiovascular status: blood pressure returned to baseline and stable Postop Assessment: no headache, no backache, no apparent nausea or vomiting, patient able to bend at knees and spinal receding Anesthetic complications: no   No complications documented.  Last Vitals:  Vitals:   11/13/19 1530 11/13/19 1545  BP: (!) 135/92 (!) 144/84  Pulse: 70 66  Resp: 14 15  Temp:    SpO2: 100% 100%    Last Pain:  Vitals:   11/13/19 1545  TempSrc:   PainSc: 4                  Redford Behrle,W. EDMOND

## 2019-11-13 NOTE — Plan of Care (Signed)
Plan of care reviewed and discussed with the patient. 

## 2019-11-13 NOTE — Op Note (Signed)
OPERATIVE REPORT- TOTAL HIP ARTHROPLASTY   PREOPERATIVE DIAGNOSIS: Osteoarthritis of the Right hip.   POSTOPERATIVE DIAGNOSIS: Osteoarthritis of the Right  hip.   PROCEDURE: Right total hip arthroplasty, anterior approach.   SURGEON: Gaynelle Arabian, MD   ASSISTANT: Griffith Citron, PA-C  ANESTHESIA:  Spinal  ESTIMATED BLOOD LOSS:-650 mL    DRAINS: Hemovac x1.   COMPLICATIONS: None   CONDITION: PACU - hemodynamically stable.   BRIEF CLINICAL NOTE: Toni Myers is a 69 y.o. female who has advanced end-  stage arthritis of their Right  hip with progressively worsening pain and  dysfunction.The patient has failed nonoperative management and presents for  total hip arthroplasty.   PROCEDURE IN DETAIL: After successful administration of spinal  anesthetic, the traction boots for the [facility] bed were placed on both  feet and the patient was placed onto the [facility] bed, boots placed into the leg  holders. The Right hip was then isolated from the perineum with plastic  drapes and prepped and draped in the usual sterile fashion. ASIS and  greater trochanter were marked and a oblique incision was made, starting  at about 1 cm lateral and 2 cm distal to the ASIS and coursing towards  the anterior cortex of the femur. The skin was cut with a 10 blade  through subcutaneous tissue to the level of the fascia overlying the  tensor fascia lata muscle. The fascia was then incised in line with the  incision at the junction of the anterior third and posterior 2/3rd. The  muscle was teased off the fascia and then the interval between the TFL  and the rectus was developed. The Hohmann retractor was then placed at  the top of the femoral neck over the capsule. The vessels overlying the  capsule were cauterized and the fat on top of the capsule was removed.  A Hohmann retractor was then placed anterior underneath the rectus  femoris to give exposure to the entire anterior capsule. A T-shaped   capsulotomy was performed. The edges were tagged and the femoral head  was identified.       Osteophytes are removed off the superior acetabulum.  The femoral neck was then cut in situ with an oscillating saw. Traction  was then applied to the left lower extremity utilizing the [facility]  traction. The femoral head was then removed. Retractors were placed  around the acetabulum and then circumferential removal of the labrum was  performed. Osteophytes were also removed. Reaming starts at 45 mm to  medialize and  Increased in 2 mm increments to 49 mm. We reamed in  approximately 40 degrees of abduction, 20 degrees anteversion. A 50 mm  pinnacle acetabular shell was then impacted in anatomic position under  fluoroscopic guidance with excellent purchase. We did not need to place  any additional dome screws. A 32 mm neutral + 4 marathon liner was then  placed into the acetabular shell.       The femoral lift was then placed along the lateral aspect of the femur  just distal to the vastus ridge. The leg was  externally rotated and capsule  was stripped off the inferior aspect of the femoral neck down to the  level of the lesser trochanter, this was done with electrocautery. The femur was lifted after this was performed. The  leg was then placed in an extended and adducted position essentially delivering the femur. We also removed the capsule superiorly and the piriformis from the piriformis  fossa to gain excellent exposure of the  proximal femur. Rongeur was used to remove some cancellous bone to get  into the lateral portion of the proximal femur for placement of the  initial starter reamer. The starter broaches was placed  the starter broach  and was shown to go down the center of the canal. Broaching  with the Actis system was then performed starting at size 0  coursing  Up to size 5. A size 5 had excellent torsional and rotational  and axial stability. The trial high offset neck was then placed   with a 32 + 1 trial head. The hip was then reduced. We confirmed that  the stem was in the canal both on AP and lateral x-rays. It also has excellent sizing. The hip was reduced with outstanding stability through full extension and full external rotation.. AP pelvis was taken and the leg lengths were measured and found to be equal. Hip was then dislocated again and the femoral head and neck removed. The  femoral broach was removed. Size 5 Actis stem with a high offset  neck was then impacted into the femur following native anteversion. Has  excellent purchase in the canal. Excellent torsional and rotational and  axial stability. It is confirmed to be in the canal on AP and lateral  fluoroscopic views. The 32 + 1 ceramic head was placed and the hip  reduced with outstanding stability. Again AP pelvis was taken and it  confirmed that the leg lengths were equal. The wound was then copiously  irrigated with saline solution and the capsule reattached and repaired  with Ethibond suture. 30 ml of .25% Bupivicaine was  injected into the capsule and into the edge of the tensor fascia lata as well as subcutaneous tissue. The fascia overlying the tensor fascia lata was then closed with a running #1 V-Loc. Subcu was closed with interrupted 2-0 Vicryl and subcuticular running 4-0 Monocryl. Incision was cleaned  and dried. Steri-Strips and a bulky sterile dressing applied. Hemovac  drain was hooked to suction and then the patient was awakened and transported to  recovery in stable condition.        Please note that a surgical assistant was a medical necessity for this procedure to perform it in a safe and expeditious manner. Assistant was necessary to provide appropriate retraction of vital neurovascular structures and to prevent femoral fracture and allow for anatomic placement of the prosthesis.  Gaynelle Arabian, M.D.

## 2019-11-13 NOTE — Interval H&P Note (Signed)
History and Physical Interval Note:  11/13/2019 11:29 AM  Toni Myers  has presented today for surgery, with the diagnosis of right hip osteoarthritis.  The various methods of treatment have been discussed with the patient and family. After consideration of risks, benefits and other options for treatment, the patient has consented to  Procedure(s) with comments: Gordon (Right) - 162min as a surgical intervention.  The patient's history has been reviewed, patient examined, no change in status, stable for surgery.  I have reviewed the patient's chart and labs.  Questions were answered to the patient's satisfaction.     Pilar Plate Terre Hanneman

## 2019-11-13 NOTE — H&P (Signed)
TOTAL HIP ADMISSION H&P  Patient is admitted for right total hip arthroplasty.  Subjective:  Chief Complaint: right hip pain  HPI: Toni Myers, 69 y.o. female, has a history of pain and functional disability in the right hip(s) due to arthritis and patient has failed non-surgical conservative treatments for greater than 12 weeks to include corticosteriod injections and activity modification.  Onset of symptoms was gradual starting 3 years ago with gradually worsening course since that time.The patient noted no past surgery on the right hip(s).  Patient currently rates pain in the right hip at 7 out of 10 with activity. Patient has worsening of pain with activity and weight bearing and pain that interfers with activities of daily living. Patient has evidence of joint space narrowing by imaging studies. This condition presents safety issues increasing the risk of falls.  There is no current active infection.  Patient Active Problem List   Diagnosis Date Noted  . Preoperative clearance 11/05/2019  . Chronic rhinitis 02/26/2019  . COPD (chronic obstructive pulmonary disease) ([place]) 05/24/2018  . Sinus tachycardia 03/22/2018  . Pneumonitis 12/17/2015  . Bronchospasm   . Chest congestion   . Low TSH level 10/10/2015  . Palpitations 10/10/2015  . Chest tightness 10/10/2015  . CKD (chronic kidney disease), stage II 10/08/2015  . Acute respiratory failure with hypoxia ([place]) 10/06/2015  . Vocal cord dysfunction 10/02/2014  . Dementia without behavioral disturbance ([place])   . GERD (gastroesophageal reflux disease)   . Uveitis 07/22/2013  . Rheumatoid arthritis ([place]) 07/22/2013  . Depression 08/31/2011  . RLS (restless legs syndrome) 08/31/2011  . Sjogren's disease ([place]) 08/31/2011  . Essential hypertension 08/06/2007  . Cough 08/06/2007   Past Medical History:  Diagnosis Date  . Anxiety   . Arthritis    Rheumatoid  . Asthma   . Chronic iritis, left eye   . Chronic low back pain   .  Closed fracture of multiple pubic rami ([place]) 07/19/2013  . Closed fracture of pelvic rim 07/19/2013  . Cough   . Depression   . Dyspnea   . Dysrhythmia    resolved  . GERD (gastroesophageal reflux disease)   . Hypertension   . Migraine    none in several years  . Palpitations   . RLS (restless legs syndrome)   . Shingles   . Sjogrens syndrome [facility])     Past Surgical History:  Procedure Laterality Date  . BACK SURGERY     07/1998 and 03/1999, herniated disc, 2 rod and 4 screws  . COLONOSCOPY WITH PROPOFOL N/A 02/09/2015   Procedure: COLONOSCOPY WITH PROPOFOL;  Surgeon: Garlan Fair, MD;  Location: [facility] ENDOSCOPY;  Service: Endoscopy;  Laterality: N/A;  . ESOPHAGOGASTRODUODENOSCOPY (EGD) WITH PROPOFOL N/A 02/09/2015   Procedure: ESOPHAGOGASTRODUODENOSCOPY (EGD) WITH PROPOFOL;  Surgeon: Garlan Fair, MD;  Location: [facility] ENDOSCOPY;  Service: Endoscopy;  Laterality: N/A;  . EYE SURGERY Bilateral 2020  . TUBAL LIGATION      Current Facility-Administered Medications  Medication Dose Route Frequency Provider Last Rate Last Admin  . acetaminophen (OFIRMEV) IV 1,000 mg  1,000 mg Intravenous Q6H Cyntha Brickman R, PA-C      . ceFAZolin (ANCEF) IVPB 2g/100 mL premix  2 g Intravenous On Call to OR Maurice March, PA-C      . chlorhexidine (PERIDEX) 0.12 % solution 15 mL  15 mL Mouth/Throat Once Brennan Bailey, MD       Or  . MEDLINE mouth rinse  15 mL Mouth Rinse  Once Brennan Bailey, MD      . dexamethasone (DECADRON) injection 8 mg  8 mg Intravenous Once Sheilla Maris R, PA-C      . lactated ringers infusion   Intravenous Continuous Maurice March, PA-C      . metoprolol succinate (TOPROL-XL) 24 hr tablet 25 mg  25 mg Oral NOW Suzette Battiest, MD      . povidone-iodine 10 % swab 2 application  2 application Topical Once Maurice March, PA-C      . tranexamic acid (CYKLOKAPRON) IVPB 1,000 mg  1,000 mg Intravenous To OR Maurice March, PA-C       Allergies  Allergen  Reactions  . Morphine And Related Nausea And Vomiting    "makes me sick at high doses"  . Prednisone     Causes insomnia by the 3rd day of taking.prednisone taper.    Social History   Tobacco Use  . Smoking status: Never Smoker  . Smokeless tobacco: Never Used  Substance Use Topics  . Alcohol use: Yes    Comment: 2 glasses monthly    Family History  Problem Relation Age of Onset  . Arthritis Mother      Review of Systems  Constitutional: Negative for chills and fever.  Respiratory: Negative for cough and shortness of breath.   Cardiovascular: Negative for chest pain.  Gastrointestinal: Negative for nausea and vomiting.  Musculoskeletal: Positive for arthralgias.    Objective:  Physical Exam Patient is a 69 year old female.  Well nourished and well developed. General: Alert and oriented x3, cooperative and pleasant, no acute distress. Head: normocephalic, atraumatic, neck supple. Eyes: EOMI. Respiratory: breath sounds clear in all fields, no wheezing, rales, or rhonchi. Cardiovascular: Regular rate and rhythm, no murmurs, gallops or rubs. Abdomen: non-tender to palpation and soft, normoactive bowel sounds.  Musculoskeletal: Right Hip Exam: The range of motion: Flexion to 90 degrees, Internal Rotation to 0 degrees, External Rotation to 0 degrees, and abduction to 5 degrees without discomfort. There is slight tenderness over the greater trochanteric bursa.  Calves soft and nontender. Motor function intact in LE. Strength 5/5 LE bilaterally. Neuro: Distal pulses 2+. Sensation to light touch intact in LE.  Vital signs in last 24 hours: Temp:  [98.6 F (37 C)] 98.6 F (37 C) (06/23 1107) Pulse Rate:  [92] 92 (06/23 1107) Resp:  [20] 20 (06/23 1107) BP: (136)/(93) 136/93 (06/23 1107) SpO2:  [100 %] 100 % (06/23 1107) Weight:  [63.4 kg] 63.4 kg (06/23 1107)  Labs:   Estimated body mass index is 23.99 kg/m as calculated from the following:   Height as of this  encounter: 5\' 4"  (1.626 m).   Weight as of this encounter: 63.4 kg.   Imaging Review Plain radiographs demonstrate severe degenerative joint disease of the right hip(s). The bone quality appears to be adequate for age and reported activity level.  Assessment/Plan:  End stage arthritis, right hip(s)  The patient history, physical examination, clinical judgement of the provider and imaging studies are consistent with end stage degenerative joint disease of the right hip(s) and total hip arthroplasty is deemed medically necessary. The treatment options including medical management, injection therapy, arthroscopy and arthroplasty were discussed at length. The risks and benefits of total hip arthroplasty were presented and reviewed. The risks due to aseptic loosening, infection, stiffness, dislocation/subluxation,  thromboembolic complications and other imponderables were discussed.  The patient acknowledged the explanation, agreed to proceed with the plan and consent was signed. Patient is being  admitted for inpatient treatment for surgery, pain control, PT, OT, prophylactic antibiotics, VTE prophylaxis, progressive ambulation and ADL's and discharge planning.The patient is planning to be discharged home.   Therapy Plans: HEP Disposition: Lives alone, son, daughter and neighbors checking in Planned DVT Prophylaxis: aspirin 325mg  BID DME needed: none PCP: Cyndi Bender, PA-C, clearance received Cardiologist: Dr. Acie Fredrickson Pulmonologist: Dr. Lamonte Sakai TXA: IV Allergies: prednisone dose packs - keep her awake, NKDA Anesthesia Concerns: none BMI: 25 Not diabetic.  Other: Planning on staying overnight. On Percocet 5-325 TID since November 2020. Hx of COPD/asthma, sees Pulmonology. Hx of Sjorgen's disease & lupus.   - Patient was instructed on what medications to stop prior to surgery. - Follow-up visit in 2 weeks with Dr. Wynelle Link - Begin physical therapy following surgery - Pre-operative lab work as  pre-surgical testing - Prescriptions will be provided in hospital at time of discharge  Griffith Citron, PA-C Orthopedic Surgery [facility] [place] [phone]

## 2019-11-14 ENCOUNTER — Encounter (HOSPITAL_COMMUNITY): Payer: Self-pay | Admitting: Orthopedic Surgery

## 2019-11-14 LAB — BASIC METABOLIC PANEL
Anion gap: 9 (ref 5–15)
BUN: 14 mg/dL (ref 8–23)
CO2: 25 mmol/L (ref 22–32)
Calcium: 8.5 mg/dL — ABNORMAL LOW (ref 8.9–10.3)
Chloride: 101 mmol/L (ref 98–111)
Creatinine, Ser: 0.78 mg/dL (ref 0.44–1.00)
GFR calc Af Amer: >60 mL/min (ref >60)
GFR calc non Af Amer: >60 mL/min (ref >60)
Glucose, Bld: 144 mg/dL — ABNORMAL HIGH (ref 70–99)
Potassium: 4 mmol/L (ref 3.5–5.1)
Sodium: 135 mmol/L (ref 135–145)

## 2019-11-14 LAB — CBC
HCT: 35.2 % — ABNORMAL LOW (ref 36.0–46.0)
Hemoglobin: 11.6 g/dL — ABNORMAL LOW (ref 12.0–15.0)
MCH: 31.9 pg (ref 26.0–34.0)
MCHC: 33 g/dL (ref 30.0–36.0)
MCV: 96.7 fL (ref 80.0–100.0)
Platelets: 258 10*3/uL (ref 150–400)
RBC: 3.64 MIL/uL — ABNORMAL LOW (ref 3.87–5.11)
RDW: 14.5 % (ref 11.5–15.5)
WBC: 11 10*3/uL — ABNORMAL HIGH (ref 4.0–10.5)
nRBC: 0 % (ref 0.0–0.2)

## 2019-11-14 MED ORDER — ASPIRIN 325 MG PO TBEC: 325.0000 mg | DELAYED_RELEASE_TABLET | Freq: Two times a day (BID) | ORAL | 0 refills | Status: AC

## 2019-11-14 MED ORDER — METHOCARBAMOL 500 MG PO TABS: 500.0000 mg | ORAL_TABLET | Freq: Four times a day (QID) | ORAL | 0 refills | Status: DC | PRN

## 2019-11-14 MED ORDER — OXYCODONE HCL 5 MG PO TABS: 5.0000 mg | ORAL_TABLET | Freq: Four times a day (QID) | ORAL | 0 refills | Status: DC | PRN

## 2019-11-14 NOTE — Progress Notes (Signed)
   Subjective: 1 Day Post-Op Procedure(s) (LRB): TOTAL HIP ARTHROPLASTY ANTERIOR APPROACH (Right) Patient reports pain as mild.   Patient seen in rounds for Dr. Wynelle Link. Patient is well, and has had no acute complaints or problems other than mild discomfort in the right hip. No acute events overnight. Foley catheter to be removed this morning, positive flatus. Denies CP, SHOB, N/V. Ambulated 15 feet with PT yesterday.  We will continue therapy today.   Objective: Vital signs in last 24 hours: Temp:  [97.9 F (36.6 C)-98.7 F (37.1 C)] 98.7 F (37.1 C) (06/24 0528) Pulse Rate:  [65-93] 93 (06/24 0528) Resp:  [14-25] 18 (06/24 0528) BP: (102-144)/(66-93) 114/73 (06/24 0528) SpO2:  [94 %-100 %] 94 % (06/24 0528) Weight:  [63.4 kg] 63.4 kg (06/23 1107)  Intake/Output from previous day:  Intake/Output Summary (Last 24 hours) at 11/14/2019 0725 Last data filed at 11/14/2019 0600 Gross per 24 hour  Intake 2176.2 ml  Output 2600 ml  Net -423.8 ml     Intake/Output this shift: No intake/output data recorded.  Labs: Recent Labs    11/14/19 0413  HGB 11.6*   Recent Labs    11/14/19 0413  WBC 11.0*  RBC 3.64*  HCT 35.2*  PLT 258   Recent Labs    11/14/19 0305  NA 135  K 4.0  CL 101  CO2 25  BUN 14  CREATININE 0.78  GLUCOSE 144*  CALCIUM 8.5*   No results for input(s): LABPT, INR in the last 72 hours.  Exam: General - Patient is Alert and Oriented Extremity - Neurologically intact Sensation intact distally Intact pulses distally Dorsiflexion/Plantar flexion intact Dressing - dressing C/D/I Motor Function - intact, moving foot and toes well on exam.   Past Medical History:  Diagnosis Date  . Anxiety   . Arthritis    Rheumatoid  . Asthma   . Chronic iritis, left eye   . Chronic low back pain   . Closed fracture of multiple pubic rami ([place]) 07/19/2013  . Closed fracture of pelvic rim 07/19/2013  . Cough   . Depression   . Dyspnea   . Dysrhythmia     resolved  . GERD (gastroesophageal reflux disease)   . Hypertension   . Migraine    none in several years  . Palpitations   . RLS (restless legs syndrome)   . Shingles   . Sjogrens syndrome (HCC)     Assessment/Plan: 1 Day Post-Op Procedure(s) (LRB): TOTAL HIP ARTHROPLASTY ANTERIOR APPROACH (Right) Principal Problem:   OA (osteoarthritis) of hip Active Problems:   Primary osteoarthritis of right hip  Estimated body mass index is 23.99 kg/m as calculated from the following:   Height as of this encounter: 5\' 4"  (1.626 m).   Weight as of this encounter: 63.4 kg. Advance diet Up with therapy D/C IV fluids  DVT Prophylaxis - Aspirin Weight bearing as tolerated.  Hemoglobin stable at 11.6 this morning. Plan is to go Home after hospital stay. Plan for possible discharge home today following 2 sessions of therapy if she is meeting her goals and safe to discharge home. Follow up in the office in 2 weeks.   Griffith Citron, PA-C Orthopedic Surgery [phone] 11/14/2019, 7:25 AM

## 2019-11-14 NOTE — Plan of Care (Signed)
  Problem: Health Behavior/Discharge Planning: Goal: Ability to manage health-related needs will improve Outcome: Progressing   Problem: Clinical Measurements: Goal: Ability to maintain clinical measurements within normal limits will improve Outcome: Progressing   Problem: Pain Managment: Goal: General experience of comfort will improve Outcome: Progressing

## 2019-11-14 NOTE — Progress Notes (Signed)
Physical Therapy Treatment Patient Details Name: Toni Myers MRN: [id number] DOB: 08/24/1950 Today's Date: 11/14/2019    History of Present Illness pt s/p RDATHA 11/13/2019 with history of back surgery for scoliosis in 2000 with rods in place.    PT Comments    Pt very cooperative but progressing slowly with mobility 2* c/o pain.   Follow Up Recommendations  Follow surgeon's recommendation for DC plan and follow-up therapies     Equipment Recommendations       Recommendations for Other Services       Precautions / Restrictions Precautions Precautions: None Restrictions Weight Bearing Restrictions: No    Mobility  Bed Mobility               General bed mobility comments: Pt up in chair and requests back to same  Transfers Overall transfer level: Needs assistance Equipment used: Rolling walker (2 wheeled) Transfers: Sit to/from Stand Sit to Stand: Min assist         General transfer comment: cues for LE management and use of UEs to self assist  Ambulation/Gait Ambulation/Gait assistance: Min assist Gait Distance (Feet): 28 Feet Assistive device: Rolling walker (2 wheeled) Gait Pattern/deviations: Step-to pattern Gait velocity: decr   General Gait Details: increased time with cues for sequence, posture and position from [employer]             Wheelchair Mobility    Modified Rankin (Stroke Patients Only)       Balance Overall balance assessment: Mild deficits observed, not formally tested                                          Cognition Arousal/Alertness: Awake/alert Behavior During Therapy: WFL for tasks assessed/performed Overall Cognitive Status: Within Functional Limits for tasks assessed                                        Exercises Total Joint Exercises Ankle Circles/Pumps: AROM;Both;Supine;15 reps Quad Sets: AROM;Right;10 reps;Supine Heel Slides: AAROM;Supine;Right;20 reps Hip  ABduction/ADduction: AAROM;Supine;Right;15 reps    General Comments        Pertinent Vitals/Pain Pain Assessment: 0-10 Pain Score: 8  Pain Location: R anterior thigh, burning Pain Descriptors / Indicators: Burning;Aching;Sore Pain Intervention(s): Limited activity within patient's tolerance;Monitored during session;Premedicated before session;Ice applied    Home Living                      Prior Function            PT Goals (current goals can now be found in the care plan section) Acute Rehab PT Goals Patient Stated Goal: I want to get back to walking in the gardens here in [place] PT Goal Formulation: With patient Time For Goal Achievement: 11/20/19 Potential to Achieve Goals: Good Progress towards PT goals: Progressing toward goals    Frequency    7X/week      PT Plan Current plan remains appropriate    Co-evaluation              AM-PAC PT "6 Clicks" Mobility   Outcome Measure  Help needed turning from your back to your side while in a flat bed without using bedrails?: A Little Help needed moving from lying on your back to sitting on  the side of a flat bed without using bedrails?: A Little Help needed moving to and from a bed to a chair (including a wheelchair)?: A Little Help needed standing up from a chair using your arms (e.g., wheelchair or bedside chair)?: A Little Help needed to walk in hospital room?: A Little Help needed climbing 3-5 steps with a railing? : A Lot 6 Click Score: 17    End of Session Equipment Utilized During Treatment: Gait belt Activity Tolerance: Patient tolerated treatment well Patient left: in chair;with call bell/phone within reach;with family/visitor present Nurse Communication: Mobility status PT Visit Diagnosis: Other abnormalities of gait and mobility (R26.89)     Time: 7289-7915 PT Time Calculation (min) (ACUTE ONLY): 30 min  Charges:  $Gait Training: 8-22 mins $Therapeutic Exercise: 8-22 mins                      [place] Pager [phone] Office [phone]    [facility] 11/14/2019, 12:37 PM

## 2019-11-14 NOTE — TOC Transition Note (Signed)
Transition of Care [facility]) - CM/SW Discharge Note   Patient Details  Name: Toni Myers MRN: [id number] Date of Birth: Apr 04, 1951  [facility] CM/SW Contact:  Lia Hopping, [place] Phone Number: 11/14/2019, 9:26 AM   Clinical Narrative:    Therapy Plan: HEP CSW confirm the patient has a RW and 3 in 1.    Final next level of care: Home/Self Care (HEP) Barriers to Discharge: No Barriers Identified   Patient Goals and CMS Choice     Choice offered to / list presented to : NA  Discharge Placement                       Discharge Plan and Services                DME Arranged: N/A DME Agency: NA       HH Arranged: NA HH Agency: NA        Social Determinants of Health (SDOH) Interventions     Readmission Risk Interventions No flowsheet data found.

## 2019-11-14 NOTE — Progress Notes (Signed)
Physical Therapy Treatment Patient Details Name: Toni Myers MRN: [id number] DOB: 1951/01/07 Today's Date: 11/14/2019    History of Present Illness pt s/p RDATHA 11/13/2019 with history of back surgery for scoliosis in 2000 with rods in place.    PT Comments    Pt continues very motivated and with noted improvement in activity tolerance but requiring increased time with all tasks and limited by pain.  Pt hopeful to progress to dc tomorrow.   Follow Up Recommendations  Follow surgeon's recommendation for DC plan and follow-up therapies     Equipment Recommendations       Recommendations for Other Services       Precautions / Restrictions Precautions Precautions: None Restrictions Weight Bearing Restrictions: No    Mobility  Bed Mobility               General bed mobility comments: Pt up in chair and requests back to same  Transfers Overall transfer level: Needs assistance Equipment used: Rolling walker (2 wheeled) Transfers: Sit to/from Stand Sit to Stand: Min assist         General transfer comment: cues for LE management and use of UEs to self assist  Ambulation/Gait Ambulation/Gait assistance: Min assist;Min guard Gait Distance (Feet): 74 Feet Assistive device: Rolling walker (2 wheeled) Gait Pattern/deviations: Step-to pattern Gait velocity: decr   General Gait Details: increased time with cues for sequence, posture and position from [employer]             Wheelchair Mobility    Modified Rankin (Stroke Patients Only)       Balance Overall balance assessment: Mild deficits observed, not formally tested                                          Cognition Arousal/Alertness: Awake/alert Behavior During Therapy: WFL for tasks assessed/performed Overall Cognitive Status: Within Functional Limits for tasks assessed                                        Exercises Total Joint Exercises Ankle  Circles/Pumps: AROM;Both;Supine;15 reps Quad Sets: AROM;Right;10 reps;Supine Heel Slides: AAROM;Supine;Right;20 reps Hip ABduction/ADduction: AAROM;Supine;Right;15 reps    General Comments        Pertinent Vitals/Pain Pain Assessment: 0-10 Pain Score: 8  Pain Location: R anterior thigh, burning Pain Descriptors / Indicators: Burning;Aching;Sore Pain Intervention(s): Limited activity within patient's tolerance;Monitored during session;Premedicated before session;Ice applied    Home Living                      Prior Function            PT Goals (current goals can now be found in the care plan section) Acute Rehab PT Goals Patient Stated Goal: I want to get back to walking in the gardens here in [place] PT Goal Formulation: With patient Time For Goal Achievement: 11/20/19 Potential to Achieve Goals: Good Progress towards PT goals: Progressing toward goals    Frequency    7X/week      PT Plan Current plan remains appropriate    Co-evaluation              AM-PAC PT "6 Clicks" Mobility   Outcome Measure  Help needed turning from your back to your side while  in a flat bed without using bedrails?: A Little Help needed moving from lying on your back to sitting on the side of a flat bed without using bedrails?: A Little Help needed moving to and from a bed to a chair (including a wheelchair)?: A Little Help needed standing up from a chair using your arms (e.g., wheelchair or bedside chair)?: A Little Help needed to walk in hospital room?: A Little Help needed climbing 3-5 steps with a railing? : A Lot 6 Click Score: 17    End of Session Equipment Utilized During Treatment: Gait belt Activity Tolerance: Patient tolerated treatment well Patient left: in chair;with call bell/phone within reach;with family/visitor present Nurse Communication: Mobility status PT Visit Diagnosis: Other abnormalities of gait and mobility (R26.89)     Time: 1497-0263 PT  Time Calculation (min) (ACUTE ONLY): 29 min  Charges:  $Gait Training: 23-37 mins $Therapeutic Exercise: 8-22 mins                     [place] Pager [phone] Office [phone]    Gamal Todisco 11/14/2019, 3:00 PM

## 2019-11-15 DIAGNOSIS — Z8261 Family history of arthritis: Secondary | ICD-10-CM | POA: Diagnosis not present

## 2019-11-15 DIAGNOSIS — I10 Essential (primary) hypertension: Secondary | ICD-10-CM | POA: Diagnosis present

## 2019-11-15 DIAGNOSIS — M1611 Unilateral primary osteoarthritis, right hip: Secondary | ICD-10-CM | POA: Diagnosis present

## 2019-11-15 DIAGNOSIS — J449 Chronic obstructive pulmonary disease, unspecified: Secondary | ICD-10-CM | POA: Diagnosis present

## 2019-11-15 LAB — CBC
HCT: 29.8 % — ABNORMAL LOW (ref 36.0–46.0)
Hemoglobin: 9.8 g/dL — ABNORMAL LOW (ref 12.0–15.0)
MCH: 32 pg (ref 26.0–34.0)
MCHC: 32.9 g/dL (ref 30.0–36.0)
MCV: 97.4 fL (ref 80.0–100.0)
Platelets: 292 10*3/uL (ref 150–400)
RBC: 3.06 MIL/uL — ABNORMAL LOW (ref 3.87–5.11)
RDW: 14.6 % (ref 11.5–15.5)
WBC: 7.7 10*3/uL (ref 4.0–10.5)
nRBC: 0 % (ref 0.0–0.2)

## 2019-11-15 LAB — BASIC METABOLIC PANEL
Anion gap: 9 (ref 5–15)
BUN: 14 mg/dL (ref 8–23)
CO2: 26 mmol/L (ref 22–32)
Calcium: 8.4 mg/dL — ABNORMAL LOW (ref 8.9–10.3)
Chloride: 100 mmol/L (ref 98–111)
Creatinine, Ser: 0.77 mg/dL (ref 0.44–1.00)
GFR calc Af Amer: >60 mL/min (ref >60)
GFR calc non Af Amer: >60 mL/min (ref >60)
Glucose, Bld: 122 mg/dL — ABNORMAL HIGH (ref 70–99)
Potassium: 4.1 mmol/L (ref 3.5–5.1)
Sodium: 135 mmol/L (ref 135–145)

## 2019-11-15 NOTE — Plan of Care (Signed)
  Problem: Health Behavior/Discharge Planning: Goal: Ability to manage health-related needs will improve Outcome: Progressing   Problem: Activity: Goal: Risk for activity intolerance will decrease Outcome: Progressing   Problem: Pain Managment: Goal: General experience of comfort will improve Outcome: Progressing   

## 2019-11-15 NOTE — Progress Notes (Signed)
Physical Therapy Treatment Patient Details Name: Toni Myers MRN: [id number] DOB: [birth year] Today's Date: 11/15/2019    History of Present Illness pt s/p RDATHA 11/13/2019 with history of back surgery for scoliosis in 2000 with rods in place.    PT Comments    Pt continues very cooperative but limited by pain.  This date, pt ambulated increased distance in hall and performed HEP but continues to struggle with in/out bed and unable to attempt stairs 2* pain/fatigue.  Pt would benefit from additional day in acute care to further address deficits prior to dc home with minimal assist.   Follow Up Recommendations  Follow surgeon's recommendation for DC plan and follow-up therapies     Equipment Recommendations       Recommendations for Other Services       Precautions / Restrictions Precautions Precautions: None Restrictions Weight Bearing Restrictions: No    Mobility  Bed Mobility Overal bed mobility: Needs Assistance Bed Mobility: Sit to Supine       Sit to supine: Min assist   General bed mobility comments: cues for sequence and use of L LE to self assist R LE  Transfers Overall transfer level: Needs assistance Equipment used: Rolling walker (2 wheeled) Transfers: Sit to/from Stand Sit to Stand: Min assist;Min guard         General transfer comment: cues for LE management and use of UEs to self assist  Ambulation/Gait Ambulation/Gait assistance: Min guard Gait Distance (Feet): 63 Feet (and additional 15' into bathroom) Assistive device: Rolling walker (2 wheeled) Gait Pattern/deviations: Step-to pattern Gait velocity: decr   General Gait Details: increased time with cues for sequence, posture and position from RW: distance ltd by pain   Stairs             Wheelchair Mobility    Modified Rankin (Stroke Patients Only)       Balance Overall balance assessment: Mild deficits observed, not formally tested                                           Cognition Arousal/Alertness: Awake/alert Behavior During Therapy: WFL for tasks assessed/performed Overall Cognitive Status: Within Functional Limits for tasks assessed                                        Exercises Total Joint Exercises Ankle Circles/Pumps: AROM;Both;Supine;15 reps Quad Sets: AROM;Right;10 reps;Supine Heel Slides: AAROM;Supine;Right;20 reps Hip ABduction/ADduction: AAROM;Supine;Right;15 reps    General Comments        Pertinent Vitals/Pain Pain Assessment: 0-10 Pain Score: 8  Pain Location: R anterior thigh, burning Pain Descriptors / Indicators: Burning;Aching;Sore Pain Intervention(s): Limited activity within patient's tolerance;Monitored during session;Premedicated before session;Ice applied    Home Living                      Prior Function            PT Goals (current goals can now be found in the care plan section) Acute Rehab PT Goals Patient Stated Goal: I want to get back to walking in the gardens here in [place] PT Goal Formulation: With patient Time For Goal Achievement: 11/20/19 Potential to Achieve Goals: Good Progress towards PT goals: Progressing toward goals    Frequency    7X/week  PT Plan Current plan remains appropriate    Co-evaluation              AM-PAC PT "6 Clicks" Mobility   Outcome Measure  Help needed turning from your back to your side while in a flat bed without using bedrails?: A Little Help needed moving from lying on your back to sitting on the side of a flat bed without using bedrails?: A Little Help needed moving to and from a bed to a chair (including a wheelchair)?: A Little Help needed standing up from a chair using your arms (e.g., wheelchair or bedside chair)?: A Little Help needed to walk in hospital room?: A Little Help needed climbing 3-5 steps with a railing? : A Lot 6 Click Score: 17    End of Session Equipment Utilized During  Treatment: Gait belt Activity Tolerance: Patient tolerated treatment well Patient left: in chair;with call bell/phone within reach;with family/visitor present Nurse Communication: Mobility status PT Visit Diagnosis: Other abnormalities of gait and mobility (R26.89)     Time: 6701-1003 PT Time Calculation (min) (ACUTE ONLY): 40 min  Charges:  $Gait Training: 8-22 mins $Therapeutic Exercise: 8-22 mins $Therapeutic Activity: 8-22 mins                     Debe Coder PT Acute Rehabilitation Services Pager [phone] Office [phone]    Mabell Esguerra 11/15/2019, 12:24 PM

## 2019-11-15 NOTE — Progress Notes (Signed)
Physical Therapy Treatment Patient Details Name: Toni Myers MRN: [id number] DOB: 1951/02/13 Today's Date: 11/15/2019    History of Present Illness pt s/p RDATHA 11/13/2019 with history of back surgery for scoliosis in 2000 with rods in place.    PT Comments    Pt continues very cooperative but progressing slowly with mobility and requiring increased time and min assist for all tasks.  Pt hopes to progress to dc home tomorrow and now reports her dtr may be able to stay first night home with her but she will be largely on her own.   Follow Up Recommendations  Follow surgeon's recommendation for DC plan and follow-up therapies     Equipment Recommendations  None recommended by PT    Recommendations for Other Services       Precautions / Restrictions Precautions Precautions: None Restrictions Weight Bearing Restrictions: No    Mobility  Bed Mobility Overal bed mobility: Needs Assistance Bed Mobility: Supine to Sit     Supine to sit: Min assist;HOB elevated Sit to supine: Min assist   General bed mobility comments: Increased time with cues for sequence and use of belt to self assist R LE  Transfers Overall transfer level: Needs assistance Equipment used: Rolling walker (2 wheeled) Transfers: Sit to/from Stand Sit to Stand: Min guard         General transfer comment: cues for LE management and use of UEs to self assist  Ambulation/Gait Ambulation/Gait assistance: Min guard Gait Distance (Feet): 65 Feet Assistive device: Rolling walker (2 wheeled) Gait Pattern/deviations: Step-to pattern Gait velocity: decr   General Gait Details: increased time with cues for sequence, posture and position from RW: distance ltd by pain   Stairs Stairs: Yes Stairs assistance: Min assist Stair Management: Two rails;Step to pattern;Forwards Number of Stairs: 2 General stair comments: cues for sequence and assist to bring R foot over edge of step on descent   Wheelchair  Mobility    Modified Rankin (Stroke Patients Only)       Balance Overall balance assessment: Mild deficits observed, not formally tested                                          Cognition Arousal/Alertness: Awake/alert Behavior During Therapy: WFL for tasks assessed/performed Overall Cognitive Status: Within Functional Limits for tasks assessed                                        Exercises Total Joint Exercises Ankle Circles/Pumps: AROM;Both;Supine;15 reps Quad Sets: AROM;Right;10 reps;Supine Heel Slides: AAROM;Supine;Right;20 reps Hip ABduction/ADduction: AAROM;Supine;Right;15 reps    General Comments        Pertinent Vitals/Pain Pain Assessment: 0-10 Pain Score: 8  Pain Location: R anterior thigh, burning Pain Descriptors / Indicators: Burning;Aching;Sore Pain Intervention(s): Limited activity within patient's tolerance;Monitored during session;Premedicated before session;Ice applied    Home Living                      Prior Function            PT Goals (current goals can now be found in the care plan section) Acute Rehab PT Goals Patient Stated Goal: I want to get back to walking in the gardens here in [place] PT Goal Formulation: With patient Time For Goal Achievement:  11/20/19 Potential to Achieve Goals: Good Progress towards PT goals: Progressing toward goals    Frequency    7X/week      PT Plan Current plan remains appropriate    Co-evaluation              AM-PAC PT "6 Clicks" Mobility   Outcome Measure  Help needed turning from your back to your side while in a flat bed without using bedrails?: A Little Help needed moving from lying on your back to sitting on the side of a flat bed without using bedrails?: A Little Help needed moving to and from a bed to a chair (including a wheelchair)?: A Little Help needed standing up from a chair using your arms (e.g., wheelchair or bedside chair)?:  A Little Help needed to walk in hospital room?: A Little Help needed climbing 3-5 steps with a railing? : A Lot 6 Click Score: 17    End of Session Equipment Utilized During Treatment: Gait belt Activity Tolerance: Patient tolerated treatment well Patient left: in chair;with call bell/phone within reach;with family/visitor present Nurse Communication: Mobility status PT Visit Diagnosis: Other abnormalities of gait and mobility (R26.89)     Time: 0165-5374 PT Time Calculation (min) (ACUTE ONLY): 36 min  Charges:  $Gait Training: 23-37 mins $Therapeutic Exercise: 8-22 mins $Therapeutic Activity: 8-22 mins                     [place] Pager [phone] Office [phone]    Alaijah Gibler 11/15/2019, 2:39 PM

## 2019-11-15 NOTE — Progress Notes (Signed)
   Subjective: 2 Days Post-Op Procedure(s) (LRB): TOTAL HIP ARTHROPLASTY ANTERIOR APPROACH (Right) Patient reports pain as mild.  Feels like she can not move leg fully yet Plan is to go Home after hospital stay.  Objective: Vital signs in last 24 hours: Temp:  [98.1 F (36.7 C)-99.3 F (37.4 C)] 98.1 F (36.7 C) (06/25 0453) Pulse Rate:  [100-111] 101 (06/25 0453) Resp:  [14-16] 16 (06/25 0453) BP: (102-110)/(65-77) 102/74 (06/25 0453) SpO2:  [87 %-100 %] 100 % (06/24 2201)  Intake/Output from previous day:  Intake/Output Summary (Last 24 hours) at 11/15/2019 0642 Last data filed at 11/14/2019 1830 Gross per 24 hour  Intake 960 ml  Output 0 ml  Net 960 ml    Intake/Output this shift: No intake/output data recorded.  Labs: Recent Labs    11/14/19 0413 11/15/19 0245  HGB 11.6* 9.8*   Recent Labs    11/14/19 0413 11/15/19 0245  WBC 11.0* 7.7  RBC 3.64* 3.06*  HCT 35.2* 29.8*  PLT 258 292   Recent Labs    11/14/19 0305 11/15/19 0245  NA 135 135  K 4.0 4.1  CL 101 100  CO2 25 26  BUN 14 14  CREATININE 0.78 0.77  GLUCOSE 144* 122*  CALCIUM 8.5* 8.4*   No results for input(s): LABPT, INR in the last 72 hours.  EXAM General - Patient is Alert, Appropriate and Oriented Extremity - Neurologically intact Neurovascular intact No cellulitis present Compartment soft Dressing/Incision - clean, dry, no drainage Motor Function - intact, moving foot and toes well on exam.   Past Medical History:  Diagnosis Date  . Anxiety   . Arthritis    Rheumatoid  . Asthma   . Chronic iritis, left eye   . Chronic low back pain   . Closed fracture of multiple pubic rami ([place]) 07/19/2013  . Closed fracture of pelvic rim 07/19/2013  . Cough   . Depression   . Dyspnea   . Dysrhythmia    resolved  . GERD (gastroesophageal reflux disease)   . Hypertension   . Migraine    none in several years  . Palpitations   . RLS (restless legs syndrome)   . Shingles   . Sjogrens  syndrome (HCC)     Assessment/Plan: 2 Days Post-Op Procedure(s) (LRB): TOTAL HIP ARTHROPLASTY ANTERIOR APPROACH (Right) Principal Problem:   OA (osteoarthritis) of hip Active Problems:   Primary osteoarthritis of right hip   Up with therapy  Discharge home today after PT. May need 2 sessions if she does not meet goals with morning session  Weight Bearing As Tolerated right Leg  Toni Myers 11/15/2019, 6:42 AM

## 2019-11-15 NOTE — Progress Notes (Signed)
Notified A Stinson PA that patient did not pass therapy today discharge cancelled D [employer]

## 2019-11-16 ENCOUNTER — Other Ambulatory Visit: Payer: Self-pay

## 2019-11-16 LAB — CBC
HCT: 27.4 % — ABNORMAL LOW (ref 36.0–46.0)
Hemoglobin: 9 g/dL — ABNORMAL LOW (ref 12.0–15.0)
MCH: 32.1 pg (ref 26.0–34.0)
MCHC: 32.8 g/dL (ref 30.0–36.0)
MCV: 97.9 fL (ref 80.0–100.0)
Platelets: 280 10*3/uL (ref 150–400)
RBC: 2.8 MIL/uL — ABNORMAL LOW (ref 3.87–5.11)
RDW: 14.5 % (ref 11.5–15.5)
WBC: 6.3 10*3/uL (ref 4.0–10.5)
nRBC: 0 % (ref 0.0–0.2)

## 2019-11-16 NOTE — TOC Progression Note (Signed)
Transition of Care [facility]) - Progression Note    Patient Details  Name: Toni Myers MRN: [id number] Date of Birth: Nov 25, 1950  Transition of [facility]) CM/SW Contact  Joaquin Courts, RN Phone Number: 11/16/2019, 2:36 PM  Clinical Narrative:    Patient set up with [facility] for HHPT.    Expected Discharge Plan: [place] Barriers to Discharge: No Barriers Identified  Expected Discharge Plan and Services Expected Discharge Plan: [place]   Discharge Planning Services: CM Consult Post Acute Care Choice: [place] arrangements for the past 2 months: Single Family Home Expected Discharge Date: 11/16/19               DME Arranged: N/A DME Agency: NA       HH Arranged: PT HH Agency: [facility] Date [place] Agency Contacted: 11/16/19 Time [place]: [id number] Representative spoke with at [place]: [place]   Social Determinants of Health ([place]) Interventions    Readmission Risk Interventions No flowsheet data found.

## 2019-11-16 NOTE — Progress Notes (Signed)
Physical Therapy Treatment Patient Details Name: Toni Myers MRN: [id number] DOB: 03/22/1951 Today's Date: 11/16/2019    History of Present Illness pt s/p RDATHA 11/13/2019 with history of back surgery for scoliosis in 2000 with rods in place.    PT Comments    Pt performed limited HEP with assist and educated on methods to self assist.   Follow Up Recommendations  Home health PT     Equipment Recommendations  None recommended by PT    Recommendations for Other Services       Precautions / Restrictions Precautions Precautions: Fall Restrictions Weight Bearing Restrictions: No    Mobility  Bed Mobility Overal bed mobility: Needs Assistance Bed Mobility: Sit to Supine       Sit to supine: Min guard   General bed mobility comments: Increased time with cues for sequence and use of belt to self assist R LE  Transfers Overall transfer level: Needs assistance Equipment used: Rolling walker (2 wheeled) Transfers: Sit to/from Stand Sit to Stand: Min guard         General transfer comment: cues for LE management and use of UEs to self assist  Ambulation/Gait Ambulation/Gait assistance: Min guard Gait Distance (Feet): 70 Feet (and 15' into bathroom) Assistive device: Rolling walker (2 wheeled) Gait Pattern/deviations: Step-to pattern Gait velocity: decr   General Gait Details: increased time with cues for sequence, posture and position from RW: distance ltd by pain   Stairs Stairs: Yes Stairs assistance: Min assist Stair Management: Two rails;Step to pattern;Forwards Number of Stairs: 5 General stair comments: cues for sequence and foot placement   Wheelchair Mobility    Modified Rankin (Stroke Patients Only)       Balance Overall balance assessment: Mild deficits observed, not formally tested                                          Cognition Arousal/Alertness: Awake/alert Behavior During Therapy: WFL for tasks  assessed/performed;Anxious Overall Cognitive Status: Within Functional Limits for tasks assessed                                        Exercises Total Joint Exercises Ankle Circles/Pumps: AROM;Both;Supine;15 reps Quad Sets: AROM;Right;10 reps;Supine Heel Slides: AAROM;Supine;Right;20 reps Hip ABduction/ADduction: AAROM;Supine;Right;15 reps    General Comments        Pertinent Vitals/Pain Pain Assessment: 0-10 Pain Score: 7  Pain Location: R anterior thigh, burning Pain Descriptors / Indicators: Burning;Aching;Sore Pain Intervention(s): Limited activity within patient's tolerance;Monitored during session;Premedicated before session;Ice applied    Home Living                      Prior Function            PT Goals (current goals can now be found in the care plan section) Acute Rehab PT Goals Patient Stated Goal: I want to get back to walking in the gardens here in [place] PT Goal Formulation: With patient Time For Goal Achievement: 11/20/19 Potential to Achieve Goals: Good Progress towards PT goals: Progressing toward goals    Frequency    7X/week      PT Plan Current plan remains appropriate    Co-evaluation              AM-PAC PT "6 Clicks"  Mobility   Outcome Measure  Help needed turning from your back to your side while in a flat bed without using bedrails?: A Little Help needed moving from lying on your back to sitting on the side of a flat bed without using bedrails?: A Little Help needed moving to and from a bed to a chair (including a wheelchair)?: A Little Help needed standing up from a chair using your arms (e.g., wheelchair or bedside chair)?: A Little Help needed to walk in hospital room?: A Little Help needed climbing 3-5 steps with a railing? : A Little 6 Click Score: 18    End of Session Equipment Utilized During Treatment: Gait belt Activity Tolerance: Patient tolerated treatment well;Patient limited by  fatigue;Patient limited by pain Patient left: in bed;with call bell/phone within reach;with bed alarm set Nurse Communication: Mobility status PT Visit Diagnosis: Other abnormalities of gait and mobility (R26.89)     Time: 7062-3762 PT Time Calculation (min) (ACUTE ONLY): 19 min  Charges:  $Gait Training: 23-37 mins $Therapeutic Exercise: 8-22 mins $Therapeutic Activity: 8-22 mins                     [place] Pager [phone] Office [phone]    Thalya Fouche 11/16/2019, 1:05 PM

## 2019-11-16 NOTE — Progress Notes (Signed)
Physical Therapy Treatment Patient Details Name: Toni Myers MRN: [id number] DOB: 1951/02/11 Today's Date: 11/16/2019    History of Present Illness pt s/p RDATHA 11/13/2019 with history of back surgery for scoliosis in 2000 with rods in place.    PT Comments    HHPT has been approved and pt has arranged for assist of friend at home.  Friend in room and reviewed HEP as well as demonstrated pt bed mobility and car transfers and verbally reviewed stairs.  Pt's friend reports he had hip replacement ~ 1 year ago so he is familiar with situation.  Pt appears much more at ease with idea of returning home now that appropriate assist is in place.   Follow Up Recommendations  Home health PT     Equipment Recommendations  None recommended by PT    Recommendations for Other Services       Precautions / Restrictions Precautions Precautions: Fall Restrictions Weight Bearing Restrictions: No    Mobility  Bed Mobility                  Transfers                    Ambulation/Gait                 Stairs             Wheelchair Mobility    Modified Rankin (Stroke Patients Only)       Balance Overall balance assessment: Mild deficits observed, not formally tested                                          Cognition Arousal/Alertness: Awake/alert Behavior During Therapy: WFL for tasks assessed/performed;Anxious Overall Cognitive Status: Within Functional Limits for tasks assessed                                        Exercises Total Joint Exercises Ankle Circles/Pumps: AROM;Both;Supine;15 reps Quad Sets: AROM;Right;10 reps;Supine Heel Slides: AAROM;Supine;Right;20 reps Hip ABduction/ADduction: AAROM;Supine;Right;15 reps    General Comments        Pertinent Vitals/Pain Pain Assessment: 0-10 Pain Score: 7  Pain Location: R anterior thigh, burning Pain Descriptors / Indicators: Burning;Aching;Sore Pain  Intervention(s): Limited activity within patient's tolerance;Monitored during session;Premedicated before session    Home Living                      Prior Function            PT Goals (current goals can now be found in the care plan section) Acute Rehab PT Goals Patient Stated Goal: I want to get back to walking in the gardens here in [place] PT Goal Formulation: With patient Time For Goal Achievement: 11/20/19 Potential to Achieve Goals: Good Progress towards PT goals: Progressing toward goals    Frequency    7X/week      PT Plan Current plan remains appropriate    Co-evaluation              AM-PAC PT "6 Clicks" Mobility   Outcome Measure  Help needed turning from your back to your side while in a flat bed without using bedrails?: A Little Help needed moving from lying on your back to sitting on the side of  a flat bed without using bedrails?: A Little Help needed moving to and from a bed to a chair (including a wheelchair)?: A Little Help needed standing up from a chair using your arms (e.g., wheelchair or bedside chair)?: A Little Help needed to walk in hospital room?: A Little Help needed climbing 3-5 steps with a railing? : A Little 6 Click Score: 18    End of Session Equipment Utilized During Treatment: Gait belt Activity Tolerance: Patient tolerated treatment well;Patient limited by fatigue;Patient limited by pain Patient left: in bed;with call bell/phone within reach;with bed alarm set Nurse Communication: Mobility status PT Visit Diagnosis: Other abnormalities of gait and mobility (R26.89)     Time: 2694-8546 PT Time Calculation (min) (ACUTE ONLY): 25 min  Charges:  $Therapeutic Exercise: 8-22 mins $Therapeutic Activity: 8-22 mins                     [place] Pager [phone] Office [phone]    Takiera Mayo 11/16/2019, 4:45 PM

## 2019-11-16 NOTE — Progress Notes (Signed)
    Subjective:  Patient reports pain as mild to moderate.  Denies N/V/CP/SOB. States recovery is harder than she expected, but she is improving  Objective:   VITALS:   Vitals:   11/15/19 2208 11/16/19 0140 11/16/19 0512 11/16/19 0815  BP: 100/68 120/79 106/70   Pulse: 96 100 (!) 106 (!) 108  Resp: 15 16 16 16   Temp: 99 F (37.2 C) 98.6 F (37 C) 98.4 F (36.9 C)   TempSrc: Oral Oral Oral   SpO2: 91% 95% 95% 98%  Weight: 63.4 kg     Height: 5\' 4"  (1.626 m)       NAD ABD soft Sensation intact distally Intact pulses distally Dorsiflexion/Plantar flexion intact Incision: dressing C/D/I Compartment soft   Lab Results  Component Value Date   WBC 6.3 11/16/2019   HGB 9.0 (L) 11/16/2019   HCT 27.4 (L) 11/16/2019   MCV 97.9 11/16/2019   PLT 280 11/16/2019   BMET    Component Value Date/Time   NA 135 11/15/2019 0245   K 4.1 11/15/2019 0245   CL 100 11/15/2019 0245   CO2 26 11/15/2019 0245   GLUCOSE 122 (H) 11/15/2019 0245   BUN 14 11/15/2019 0245   CREATININE 0.77 11/15/2019 0245   CALCIUM 8.4 (L) 11/15/2019 0245   GFRNONAA >60 11/15/2019 0245   GFRAA >60 11/15/2019 0245     Assessment/Plan: 3 Days Post-Op   Principal Problem:   OA (osteoarthritis) of hip Active Problems:   Primary osteoarthritis of right hip   Osteoarthritis of right hip   WBAT with walker DVT ppx: Aspirin, SCDs, TEDS PO pain control PT/OT Dispo: d/c home with HEP after clears PT   Toni Myers 11/16/2019, 10:25 AM   Toni Can, MD [phone] [facility] now [facility]  [place] [address], [address], [place],  [address] Phone: [phone] [email or web address] Facebook  [employer]

## 2019-11-16 NOTE — Progress Notes (Signed)
Physical Therapy Treatment Patient Details Name: Toni Myers MRN: [id number] DOB: [birth year] Today's Date: 11/16/2019    History of Present Illness pt s/p RDATHA 11/13/2019 with history of back surgery for scoliosis in 2000 with rods in place.    PT Comments    Pt continues to make slow steady progress with all mobility tasks - pt largely limited by c/o pain .  Pt requiring frequent reassurance and asking similar questions repeatedly.  Pt initially stating she planned to dc home ALONE but now states she can ask a close friend to stay for first night.  Pt would greatly benefit from follow up HHPT to maximize IND and safety in home setting with limited assist.   Follow Up Recommendations  Home health PT     Equipment Recommendations  None recommended by PT    Recommendations for Other Services       Precautions / Restrictions Precautions Precautions: Fall Restrictions Weight Bearing Restrictions: No    Mobility  Bed Mobility Overal bed mobility: Needs Assistance Bed Mobility: Sit to Supine       Sit to supine: Min guard   General bed mobility comments: Increased time with cues for sequence and use of belt to self assist R LE  Transfers Overall transfer level: Needs assistance Equipment used: Rolling walker (2 wheeled) Transfers: Sit to/from Stand Sit to Stand: Min guard         General transfer comment: cues for LE management and use of UEs to self assist  Ambulation/Gait Ambulation/Gait assistance: Min guard Gait Distance (Feet): 70 Feet (and 15' into bathroom) Assistive device: Rolling walker (2 wheeled) Gait Pattern/deviations: Step-to pattern Gait velocity: decr   General Gait Details: increased time with cues for sequence, posture and position from RW: distance ltd by pain   Stairs Stairs: Yes Stairs assistance: Min assist Stair Management: Two rails;Step to pattern;Forwards Number of Stairs: 5 General stair comments: cues for sequence and foot  placement   Wheelchair Mobility    Modified Rankin (Stroke Patients Only)       Balance Overall balance assessment: Mild deficits observed, not formally tested                                          Cognition Arousal/Alertness: Awake/alert Behavior During Therapy: WFL for tasks assessed/performed;Anxious Overall Cognitive Status: Within Functional Limits for tasks assessed                                        Exercises      General Comments        Pertinent Vitals/Pain Pain Assessment: 0-10 Pain Score: 8  Pain Location: R anterior thigh, burning Pain Descriptors / Indicators: Burning;Aching;Sore Pain Intervention(s): Limited activity within patient's tolerance;Monitored during session;Premedicated before session;Ice applied    Home Living                      Prior Function            PT Goals (current goals can now be found in the care plan section) Acute Rehab PT Goals Patient Stated Goal: I want to get back to walking in the gardens here in [place] PT Goal Formulation: With patient Time For Goal Achievement: 11/20/19 Potential to Achieve Goals: Good Progress towards PT goals:  Progressing toward goals    Frequency    7X/week      PT Plan Discharge plan needs to be updated    Co-evaluation              AM-PAC PT "6 Clicks" Mobility   Outcome Measure  Help needed turning from your back to your side while in a flat bed without using bedrails?: A Little Help needed moving from lying on your back to sitting on the side of a flat bed without using bedrails?: A Little Help needed moving to and from a bed to a chair (including a wheelchair)?: A Little Help needed standing up from a chair using your arms (e.g., wheelchair or bedside chair)?: A Little Help needed to walk in hospital room?: A Little Help needed climbing 3-5 steps with a railing? : A Little 6 Click Score: 18    End of Session  Equipment Utilized During Treatment: Gait belt Activity Tolerance: Patient tolerated treatment well;Patient limited by fatigue;Patient limited by pain Patient left: in bed;with call bell/phone within reach;with bed alarm set Nurse Communication: Mobility status PT Visit Diagnosis: Other abnormalities of gait and mobility (R26.89)     Time: 1020-1105 PT Time Calculation (min) (ACUTE ONLY): 45 min  Charges:  $Gait Training: 23-37 mins $Therapeutic Activity: 8-22 mins                     [place] Pager [phone] Office [phone]    Garrick Midgley 11/16/2019, 1:00 PM

## 2019-11-18 DIAGNOSIS — M419 Scoliosis, unspecified: Secondary | ICD-10-CM | POA: Diagnosis not present

## 2019-11-18 DIAGNOSIS — J310 Chronic rhinitis: Secondary | ICD-10-CM | POA: Diagnosis not present

## 2019-11-18 DIAGNOSIS — Z7951 Long term (current) use of inhaled steroids: Secondary | ICD-10-CM | POA: Diagnosis not present

## 2019-11-18 DIAGNOSIS — G2581 Restless legs syndrome: Secondary | ICD-10-CM | POA: Diagnosis not present

## 2019-11-18 DIAGNOSIS — K219 Gastro-esophageal reflux disease without esophagitis: Secondary | ICD-10-CM | POA: Diagnosis not present

## 2019-11-18 DIAGNOSIS — M069 Rheumatoid arthritis, unspecified: Secondary | ICD-10-CM | POA: Diagnosis not present

## 2019-11-18 DIAGNOSIS — G8929 Other chronic pain: Secondary | ICD-10-CM | POA: Diagnosis not present

## 2019-11-18 DIAGNOSIS — Z96641 Presence of right artificial hip joint: Secondary | ICD-10-CM | POA: Diagnosis not present

## 2019-11-18 DIAGNOSIS — Z8701 Personal history of pneumonia (recurrent): Secondary | ICD-10-CM | POA: Diagnosis not present

## 2019-11-18 DIAGNOSIS — I129 Hypertensive chronic kidney disease with stage 1 through stage 4 chronic kidney disease, or unspecified chronic kidney disease: Secondary | ICD-10-CM | POA: Diagnosis not present

## 2019-11-18 DIAGNOSIS — N182 Chronic kidney disease, stage 2 (mild): Secondary | ICD-10-CM | POA: Diagnosis not present

## 2019-11-18 DIAGNOSIS — M3500 Sicca syndrome, unspecified: Secondary | ICD-10-CM | POA: Diagnosis not present

## 2019-11-18 DIAGNOSIS — Z471 Aftercare following joint replacement surgery: Secondary | ICD-10-CM | POA: Diagnosis not present

## 2019-11-18 DIAGNOSIS — G43909 Migraine, unspecified, not intractable, without status migrainosus: Secondary | ICD-10-CM | POA: Diagnosis not present

## 2019-11-18 DIAGNOSIS — Z981 Arthrodesis status: Secondary | ICD-10-CM | POA: Diagnosis not present

## 2019-11-18 DIAGNOSIS — H2012 Chronic iridocyclitis, left eye: Secondary | ICD-10-CM | POA: Diagnosis not present

## 2019-11-18 DIAGNOSIS — Z7982 Long term (current) use of aspirin: Secondary | ICD-10-CM | POA: Diagnosis not present

## 2019-11-18 DIAGNOSIS — J449 Chronic obstructive pulmonary disease, unspecified: Secondary | ICD-10-CM | POA: Diagnosis not present

## 2019-11-18 DIAGNOSIS — M810 Age-related osteoporosis without current pathological fracture: Secondary | ICD-10-CM | POA: Diagnosis not present

## 2019-11-18 DIAGNOSIS — F329 Major depressive disorder, single episode, unspecified: Secondary | ICD-10-CM | POA: Diagnosis not present

## 2019-11-18 DIAGNOSIS — F419 Anxiety disorder, unspecified: Secondary | ICD-10-CM | POA: Diagnosis not present

## 2019-11-18 DIAGNOSIS — Z79899 Other long term (current) drug therapy: Secondary | ICD-10-CM | POA: Diagnosis not present

## 2019-11-18 DIAGNOSIS — F0390 Unspecified dementia without behavioral disturbance: Secondary | ICD-10-CM | POA: Diagnosis not present

## 2019-11-18 DIAGNOSIS — Z9181 History of falling: Secondary | ICD-10-CM | POA: Diagnosis not present

## 2019-11-19 NOTE — Discharge Summary (Signed)
Physician Discharge Summary   Patient ID: Toni Myers MRN: [id number] DOB/AGE: 1950/06/21 69 y.o.  Admit date: 11/13/2019 Discharge date: 11/16/2019  Primary Diagnosis: Osteoarthritis of the Right  hip.    Admission Diagnoses:  Past Medical History:  Diagnosis Date  . Anxiety   . Arthritis    Rheumatoid  . Asthma   . Chronic iritis, left eye   . Chronic low back pain   . Closed fracture of multiple pubic rami ([place]) 07/19/2013  . Closed fracture of pelvic rim 07/19/2013  . Cough   . Depression   . Dyspnea   . Dysrhythmia    resolved  . GERD (gastroesophageal reflux disease)   . Hypertension   . Migraine    none in several years  . Palpitations   . RLS (restless legs syndrome)   . Shingles   . Sjogrens syndrome ([place])    Discharge Diagnoses:   Principal Problem:   OA (osteoarthritis) of hip Active Problems:   Primary osteoarthritis of right hip   Osteoarthritis of right hip  Estimated body mass index is 23.99 kg/m as calculated from the following:   Height as of this encounter: 5\' 4"  (1.626 m).   Weight as of this encounter: 63.4 kg.  Procedure:  Procedure(s) (LRB): TOTAL HIP ARTHROPLASTY ANTERIOR APPROACH (Right)   Consults: None  HPI: Toni Myers is a 69 y.o. female who has advanced end-  stage arthritis of their Right  hip with progressively worsening pain and  dysfunction.The patient has failed nonoperative management and presents for  total hip arthroplasty.   Laboratory Data: Admission on 11/13/2019, Discharged on 11/16/2019  Component Date Value Ref Range Status  . Sodium 11/14/2019 135  135 - 145 mmol/L Final  . Potassium 11/14/2019 4.0  3.5 - 5.1 mmol/L Final  . Chloride 11/14/2019 101  98 - 111 mmol/L Final  . CO2 11/14/2019 25  22 - 32 mmol/L Final  . Glucose, Bld 11/14/2019 144* 70 - 99 mg/dL Final   Glucose reference range applies only to samples taken after fasting for at least 8 hours.  . BUN 11/14/2019 14  8 - 23 mg/dL Final  .  Creatinine, Ser 11/14/2019 0.78  0.44 - 1.00 mg/dL Final  . Calcium 11/14/2019 8.5* 8.9 - 10.3 mg/dL Final  . GFR calc non Af Amer 11/14/2019 >60  >60 mL/min Final  . GFR calc Af Amer 11/14/2019 >60  >60 mL/min Final  . Anion gap 11/14/2019 9  5 - 15 Final   Performed at [facility], [place] [address], [place], [place] [address]  . WBC 11/14/2019 11.0* 4.0 - 10.5 K/uL Final  . RBC 11/14/2019 3.64* 3.87 - 5.11 MIL/uL Final  . Hemoglobin 11/14/2019 11.6* 12.0 - 15.0 g/dL Final  . HCT 11/14/2019 35.2* 36 - 46 % Final  . MCV 11/14/2019 96.7  80.0 - 100.0 fL Final  . MCH 11/14/2019 31.9  26.0 - 34.0 pg Final  . MCHC 11/14/2019 33.0  30.0 - 36.0 g/dL Final  . RDW 11/14/2019 14.5  11.5 - 15.5 % Final  . Platelets 11/14/2019 258  150 - 400 K/uL Final  . nRBC 11/14/2019 0.0  0.0 - 0.2 % Final   Performed at [facility], [place] [address] [place], [place] [address]  . WBC 11/15/2019 7.7  4.0 - 10.5 K/uL Final  . RBC 11/15/2019 3.06* 3.87 - 5.11 MIL/uL Final  . Hemoglobin 11/15/2019 9.8* 12.0 - 15.0 g/dL Final  . HCT 11/15/2019  29.8* 36 - 46 % Final  . MCV 11/15/2019 97.4  80.0 - 100.0 fL Final  . MCH 11/15/2019 32.0  26.0 - 34.0 pg Final  . MCHC 11/15/2019 32.9  30.0 - 36.0 g/dL Final  . RDW 11/15/2019 14.6  11.5 - 15.5 % Final  . Platelets 11/15/2019 292  150 - 400 K/uL Final  . nRBC 11/15/2019 0.0  0.0 - 0.2 % Final   Performed at [facility], [place] [address], [place], [place] [address]  . Sodium 11/15/2019 135  135 - 145 mmol/L Final  . Potassium 11/15/2019 4.1  3.5 - 5.1 mmol/L Final  . Chloride 11/15/2019 100  98 - 111 mmol/L Final  . CO2 11/15/2019 26  22 - 32 mmol/L Final  . Glucose, Bld 11/15/2019 122* 70 - 99 mg/dL Final   Glucose reference range applies only to samples taken after fasting for at least 8 hours.  . BUN 11/15/2019 14  8 - 23 mg/dL Final  . Creatinine, Ser 11/15/2019 0.77  0.44 - 1.00 mg/dL Final  . Calcium  11/15/2019 8.4* 8.9 - 10.3 mg/dL Final  . GFR calc non Af Amer 11/15/2019 >60  >60 mL/min Final  . GFR calc Af Amer 11/15/2019 >60  >60 mL/min Final  . Anion gap 11/15/2019 9  5 - 15 Final   Performed at [facility], [place] [address], [place], [place] [address]  . WBC 11/16/2019 6.3  4.0 - 10.5 K/uL Final  . RBC 11/16/2019 2.80* 3.87 - 5.11 MIL/uL Final  . Hemoglobin 11/16/2019 9.0* 12.0 - 15.0 g/dL Final  . HCT 11/16/2019 27.4* 36 - 46 % Final  . MCV 11/16/2019 97.9  80.0 - 100.0 fL Final  . MCH 11/16/2019 32.1  26.0 - 34.0 pg Final  . MCHC 11/16/2019 32.8  30.0 - 36.0 g/dL Final  . RDW 11/16/2019 14.5  11.5 - 15.5 % Final  . Platelets 11/16/2019 280  150 - 400 K/uL Final  . nRBC 11/16/2019 0.0  0.0 - 0.2 % Final   Performed at [facility], [place] [address] [place], [place] [address]  Hospital Outpatient Visit on 11/09/2019  Component Date Value Ref Range Status  . SARS Coronavirus 2 11/09/2019 NEGATIVE  NEGATIVE Final   Comment: (NOTE) SARS-CoV-2 target nucleic acids are NOT DETECTED.  The SARS-CoV-2 RNA is generally detectable in upper and lower respiratory specimens during the acute phase of infection. Negative results do not preclude SARS-CoV-2 infection, do not rule out co-infections with other pathogens, and should not be used as the sole basis for treatment or other patient management decisions. Negative results must be combined with clinical observations, patient history, and epidemiological information. The expected result is Negative.  Fact Sheet for Patients: SugarRoll.be  Fact Sheet for Healthcare Providers: https://www.woods-mathews.com/  This test is not yet approved or cleared by the [place] FDA and  has been authorized for detection and/or diagnosis of SARS-CoV-2 by FDA under an Emergency Use Authorization (EUA). This EUA will remain  in effect (meaning this test can be used)  for the duration of the COVID-19 declaration under Se                          ction 564(b)(1) of the Act, 21 U.S.C. section 360bbb-3(b)(1), unless the authorization is terminated or revoked sooner.  Performed at [facility], [place] [address] [place], [place] [address]   Hospital Outpatient Visit on 11/05/2019  Component Date Value Ref Range Status  . MRSA, PCR 11/05/2019 NEGATIVE  NEGATIVE Final  . Staphylococcus aureus 11/05/2019 NEGATIVE  NEGATIVE Final   Comment: (NOTE) The Xpert SA Assay (FDA approved for NASAL specimens in patients 26 years of age and older), is one component of a comprehensive surveillance program. It is not intended to diagnose infection nor to guide or monitor treatment. Performed at [facility], [place] [address], [place], [place] [address]   . aPTT 11/05/2019 28  24 - 36 seconds Final   Performed at [facility], [place] [address], [place], [place] [address]  . WBC 11/05/2019 5.0  4.0 - 10.5 K/uL Final  . RBC 11/05/2019 4.01  3.87 - 5.11 MIL/uL Final  . Hemoglobin 11/05/2019 12.7  12.0 - 15.0 g/dL Final  . HCT 11/05/2019 38.9  36 - 46 % Final  . MCV 11/05/2019 97.0  80.0 - 100.0 fL Final  . MCH 11/05/2019 31.7  26.0 - 34.0 pg Final  . MCHC 11/05/2019 32.6  30.0 - 36.0 g/dL Final  . RDW 11/05/2019 14.6  11.5 - 15.5 % Final  . Platelets 11/05/2019 275  150 - 400 K/uL Final  . nRBC 11/05/2019 0.0  0.0 - 0.2 % Final   Performed at [facility], [place] [address], [place], [place] [address]  . Sodium 11/05/2019 140  135 - 145 mmol/L Final  . Potassium 11/05/2019 4.6  3.5 - 5.1 mmol/L Final  . Chloride 11/05/2019 102  98 - 111 mmol/L Final  . CO2 11/05/2019 29  22 - 32 mmol/L Final  . Glucose, Bld 11/05/2019 85  70 - 99 mg/dL Final   Glucose reference range applies only to samples taken after fasting for at least 8 hours.  . BUN 11/05/2019 13  8 - 23 mg/dL Final  . Creatinine, Ser 11/05/2019 0.64   0.44 - 1.00 mg/dL Final  . Calcium 11/05/2019 9.6  8.9 - 10.3 mg/dL Final  . Total Protein 11/05/2019 8.0  6.5 - 8.1 g/dL Final  . Albumin 11/05/2019 4.6  3.5 - 5.0 g/dL Final  . AST 11/05/2019 18  15 - 41 U/L Final  . ALT 11/05/2019 16  0 - 44 U/L Final  . Alkaline Phosphatase 11/05/2019 84  38 - 126 U/L Final  . Total Bilirubin 11/05/2019 0.9  0.3 - 1.2 mg/dL Final  . GFR calc non Af Amer 11/05/2019 >60  >60 mL/min Final  . GFR calc Af Amer 11/05/2019 >60  >60 mL/min Final  . Anion gap 11/05/2019 9  5 - 15 Final   Performed at [facility], [place] [address], [place], [place] [address]  . Prothrombin Time 11/05/2019 12.0  11.4 - 15.2 seconds Final  . INR 11/05/2019 0.9  0.8 - 1.2 Final   Comment: (NOTE) INR goal varies based on device and disease states. Performed at [facility], [place] [address], [place], [place] [address]   . ABO/RH(D) 11/05/2019 A POS   Final  . Antibody Screen 11/05/2019 NEG   Final  . Sample Expiration 11/05/2019 11/16/2019,2359   Final  . Extend sample reason 11/05/2019    Final                   Value:NO TRANSFUSIONS OR PREGNANCY IN THE PAST 3 MONTHS Performed at [facility], [place] [address], [place],  [address]      X-Rays:DG Pelvis Portable  Result Date: 11/13/2019 CLINICAL DATA:  Status post right hip replacement EXAM: PORTABLE PELVIS 1-2 VIEWS COMPARISON:  None. FINDINGS: Right hip prosthesis is noted in satisfactory position. No acute fracture or dislocation is noted. Postsurgical changes in the lower lumbar spine are noted. Old healed inferior pubic ramus fractures on the right are seen. Degenerative change of the left hip joint is noted. IMPRESSION: Status post right hip replacement Electronically Signed   By: Inez Catalina M.D.   On: 11/13/2019 16:14   DG C-Arm 1-60 Min-No Report  Result Date: 11/13/2019 Fluoroscopy was utilized by the requesting physician.  No radiographic interpretation.     DG HIP OPERATIVE UNILAT W OR W/O PELVIS RIGHT  Result Date: 11/13/2019 CLINICAL DATA:  Right hip replacement EXAM: OPERATIVE RIGHT HIP WITH PELVIS COMPARISON:  None. FLUOROSCOPY TIME:  Radiation Exposure Index (as provided by the fluoroscopic device): Not available If the device does not provide the exposure index: Fluoroscopy Time:  11 seconds Number of Acquired Images:  5 FINDINGS: Initial images demonstrates severe degenerative change of the right hip joint. Subsequent right hip prosthesis is seen. No soft tissue or acute bony abnormality is noted. IMPRESSION: Right hip replacement. Electronically Signed   By: Inez Catalina M.D.   On: 11/13/2019 16:13    EKG: Orders placed or performed in visit on 11/06/19  . EKG 12-Lead     Hospital Course: Toni Myers is a 69 y.o. who was admitted to [facility]. They were brought to the operating room on 11/13/2019 and underwent Procedure(s): [place].  Patient tolerated the procedure well and was later transferred to the recovery room and then to the orthopaedic floor for postoperative care. They were given PO and IV analgesics for pain control following their surgery. They were given 24 hours of postoperative antibiotics of  Anti-infectives (From admission, onward)   Start     Dose/Rate Route Frequency Ordered Stop   11/13/19 1900  ceFAZolin (ANCEF) IVPB 2g/100 mL premix        2 g 200 mL/hr over 30 Minutes Intravenous Every 6 hours 11/13/19 1612 11/14/19 0207   11/13/19 1115  ceFAZolin (ANCEF) IVPB 2g/100 mL premix        2 g 200 mL/hr over 30 Minutes Intravenous On call to O.R. 11/13/19 1103 11/13/19 1306     and started on DVT prophylaxis in the form of Aspirin.   PT and OT were ordered for total joint protocol. Discharge planning consulted to help with postop disposition and equipment needs. Patient had a good night on the evening of surgery. They started to get up OOB with therapy on POD #0. Worked with  therapy on POD #1 and was progressing slowly secondary to discomfort in the hip. Continued to work with therapy into POD #2, but was not able to ambulate stairs and unable to safely d/c home. Pt was seen during rounds on day three and was ready to go home pending progress with therapy.  Pt worked with therapy for three additional sessions and was meeting their goals. She was discharged to home later that day in stable condition.  Diet: Regular diet Activity: WBAT Follow-up: in 2 weeks Disposition: Home Discharged Condition: good   Discharge Instructions    Call MD / Call 911   Complete by: As directed    If you experience chest pain or shortness of breath, CALL 911 and be transported to the hospital emergency room.  If you develope a fever above 101 F, pus (white drainage) or increased  drainage or redness at the wound, or calf pain, call your surgeon's office.   Call MD / Call 911   Complete by: As directed    If you experience chest pain or shortness of breath, CALL 911 and be transported to the hospital emergency room.  If you develope a fever above 101 F, pus (white drainage) or increased drainage or redness at the wound, or calf pain, call your surgeon's office.   Change dressing   Complete by: As directed    You have an adhesive waterproof bandage over the incision. Leave this in place until your first follow-up appointment. Once you remove this you will not need to place another bandage.   Constipation Prevention   Complete by: As directed    Drink plenty of fluids.  Prune juice may be helpful.  You may use a stool softener, such as Colace (over the counter) 100 mg twice a day.  Use MiraLax (over the counter) for constipation as needed.   Constipation Prevention   Complete by: As directed    Drink plenty of fluids.  Prune juice may be helpful.  You may use a stool softener, such as Colace (over the counter) 100 mg twice a day.  Use MiraLax (over the counter) for constipation as needed.     Diet - low sodium heart healthy   Complete by: As directed    Diet - low sodium heart healthy   Complete by: As directed    Do not sit on low chairs, stoools or toilet seats, as it may be difficult to get up from low surfaces   Complete by: As directed    Driving restrictions   Complete by: As directed    No driving for two weeks   Driving restrictions   Complete by: As directed    No driving for 6 weeks   Increase activity slowly as tolerated   Complete by: As directed    Lifting restrictions   Complete by: As directed    No lifting for 6 weeks   TED hose   Complete by: As directed    Use stockings (TED hose) for three weeks on both leg(s).  You may remove them at night for sleeping.   TED hose   Complete by: As directed    Use stockings (TED hose) for 2 weeks on both leg(s).  You may remove them at night for sleeping.   Weight bearing as tolerated   Complete by: As directed      Allergies as of 11/16/2019      Reactions   Morphine And Related Nausea And Vomiting   "makes me sick at high doses"   Prednisone    Causes insomnia by the 3rd day of taking.prednisone taper.      Medication List    STOP taking these medications   calcium carbonate 1500 (600 Ca) MG Tabs tablet Commonly known as: OSCAL   celecoxib 200 MG capsule Commonly known as: CELEBREX   folic acid 1 MG tablet Commonly known as: FOLVITE   oxyCODONE-acetaminophen 5-325 MG tablet Commonly known as: PERCOCET/ROXICET   vitamin B-12 1000 MCG tablet Commonly known as: CYANOCOBALAMIN   Vitamin D3 50 MCG (2000 UT) Tabs     TAKE these medications   aspirin 325 MG EC tablet Take 1 tablet (325 mg total) by mouth 2 (two) times daily for 20 days. Then take aspirin 81 mg daily for another 3 weeks.   budesonide-formoterol 160-4.5 MCG/ACT inhaler Commonly known as: SYMBICORT  Inhale 2 puffs into the lungs 2 (two) times daily.   cyclobenzaprine 10 MG tablet Commonly known as: FLEXERIL Take 1 tablet (10  mg total) by mouth at bedtime as needed. For spasms   escitalopram 10 MG tablet Commonly known as: LEXAPRO Take 10 mg by mouth daily.   gabapentin 300 MG capsule Commonly known as: NEURONTIN Take 300-600 mg by mouth See admin instructions. Take 1 capsule (300 mg) by mouth twice daily & take 2 capsules (600 mg) by mouth at bedtime.   loratadine 10 MG tablet Commonly known as: CLARITIN Take 10 mg by mouth daily.   methocarbamol 500 MG tablet Commonly known as: ROBAXIN Take 1 tablet (500 mg total) by mouth every 6 (six) hours as needed for muscle spasms.   methotrexate 250 MG/10ML injection Inject 15 mg into the vein every Wednesday. Injects 0.6 ml subcutaneously every Wed (in stomach).   metoprolol succinate 25 MG 24 hr tablet Commonly known as: TOPROL-XL Take 1 tablet (25 mg total) by mouth daily.   oxyCODONE 5 MG immediate release tablet Commonly known as: Oxy IR/ROXICODONE Take 1-2 tablets (5-10 mg total) by mouth every 6 (six) hours as needed for moderate pain or severe pain.   albuterol (2.5 MG/3ML) 0.083% nebulizer solution Commonly known as: PROVENTIL Take 2.5 mg by nebulization every 4 (four) hours as needed for wheezing or shortness of breath. Reported on 10/22/2015   ProAir HFA 108 (90 Base) MCG/ACT inhaler Generic drug: albuterol Inhale 2 puffs into the lungs every 6 (six) hours as needed for wheezing or shortness of breath.   zoledronic acid 5 MG/100ML Soln injection Commonly known as: RECLAST Inject into the vein See admin instructions. Yearly            Discharge Care Instructions  (From admission, onward)         Start     Ordered   11/14/19 0000  Weight bearing as tolerated        11/14/19 0730   11/14/19 0000  Change dressing       Comments: You have an adhesive waterproof bandage over the incision. Leave this in place until your first follow-up appointment. Once you remove this you will not need to place another bandage.   11/14/19 0730           Follow-up Information    Gaynelle Arabian, MD. Schedule an appointment as soon as possible for a visit on 11/26/2019.   Specialty: Orthopedic Surgery Contact information: [address] [place] [address] [phone]        [facility] Follow up.   Specialty: [place] Why: agency will provide home health physical therapy Contact information: [address] [place] [address] [phone]               Signed: Griffith Citron, PA-C Orthopedic Surgery 11/19/2019, 11:55 AM

## 2019-11-22 DIAGNOSIS — Z7982 Long term (current) use of aspirin: Secondary | ICD-10-CM | POA: Diagnosis not present

## 2019-11-22 DIAGNOSIS — Z96641 Presence of right artificial hip joint: Secondary | ICD-10-CM | POA: Diagnosis not present

## 2019-11-22 DIAGNOSIS — Z7951 Long term (current) use of inhaled steroids: Secondary | ICD-10-CM | POA: Diagnosis not present

## 2019-11-22 DIAGNOSIS — Z9181 History of falling: Secondary | ICD-10-CM | POA: Diagnosis not present

## 2019-11-22 DIAGNOSIS — Z8701 Personal history of pneumonia (recurrent): Secondary | ICD-10-CM | POA: Diagnosis not present

## 2019-11-22 DIAGNOSIS — M3500 Sicca syndrome, unspecified: Secondary | ICD-10-CM | POA: Diagnosis not present

## 2019-11-22 DIAGNOSIS — Z981 Arthrodesis status: Secondary | ICD-10-CM | POA: Diagnosis not present

## 2019-11-22 DIAGNOSIS — K219 Gastro-esophageal reflux disease without esophagitis: Secondary | ICD-10-CM | POA: Diagnosis not present

## 2019-11-22 DIAGNOSIS — I129 Hypertensive chronic kidney disease with stage 1 through stage 4 chronic kidney disease, or unspecified chronic kidney disease: Secondary | ICD-10-CM | POA: Diagnosis not present

## 2019-11-22 DIAGNOSIS — J310 Chronic rhinitis: Secondary | ICD-10-CM | POA: Diagnosis not present

## 2019-11-22 DIAGNOSIS — G2581 Restless legs syndrome: Secondary | ICD-10-CM | POA: Diagnosis not present

## 2019-11-22 DIAGNOSIS — M419 Scoliosis, unspecified: Secondary | ICD-10-CM | POA: Diagnosis not present

## 2019-11-22 DIAGNOSIS — M069 Rheumatoid arthritis, unspecified: Secondary | ICD-10-CM | POA: Diagnosis not present

## 2019-11-22 DIAGNOSIS — F419 Anxiety disorder, unspecified: Secondary | ICD-10-CM | POA: Diagnosis not present

## 2019-11-22 DIAGNOSIS — G8929 Other chronic pain: Secondary | ICD-10-CM | POA: Diagnosis not present

## 2019-11-22 DIAGNOSIS — J449 Chronic obstructive pulmonary disease, unspecified: Secondary | ICD-10-CM | POA: Diagnosis not present

## 2019-11-22 DIAGNOSIS — Z471 Aftercare following joint replacement surgery: Secondary | ICD-10-CM | POA: Diagnosis not present

## 2019-11-22 DIAGNOSIS — F0390 Unspecified dementia without behavioral disturbance: Secondary | ICD-10-CM | POA: Diagnosis not present

## 2019-11-22 DIAGNOSIS — F329 Major depressive disorder, single episode, unspecified: Secondary | ICD-10-CM | POA: Diagnosis not present

## 2019-11-22 DIAGNOSIS — Z79899 Other long term (current) drug therapy: Secondary | ICD-10-CM | POA: Diagnosis not present

## 2019-11-22 DIAGNOSIS — M810 Age-related osteoporosis without current pathological fracture: Secondary | ICD-10-CM | POA: Diagnosis not present

## 2019-11-22 DIAGNOSIS — N182 Chronic kidney disease, stage 2 (mild): Secondary | ICD-10-CM | POA: Diagnosis not present

## 2019-11-22 DIAGNOSIS — H2012 Chronic iridocyclitis, left eye: Secondary | ICD-10-CM | POA: Diagnosis not present

## 2019-11-22 DIAGNOSIS — G43909 Migraine, unspecified, not intractable, without status migrainosus: Secondary | ICD-10-CM | POA: Diagnosis not present

## 2019-11-26 DIAGNOSIS — G2581 Restless legs syndrome: Secondary | ICD-10-CM | POA: Diagnosis not present

## 2019-11-26 DIAGNOSIS — J449 Chronic obstructive pulmonary disease, unspecified: Secondary | ICD-10-CM | POA: Diagnosis not present

## 2019-11-26 DIAGNOSIS — F329 Major depressive disorder, single episode, unspecified: Secondary | ICD-10-CM | POA: Diagnosis not present

## 2019-11-26 DIAGNOSIS — F419 Anxiety disorder, unspecified: Secondary | ICD-10-CM | POA: Diagnosis not present

## 2019-11-26 DIAGNOSIS — Z96641 Presence of right artificial hip joint: Secondary | ICD-10-CM | POA: Insufficient documentation

## 2019-11-26 DIAGNOSIS — Z79899 Other long term (current) drug therapy: Secondary | ICD-10-CM | POA: Diagnosis not present

## 2019-11-26 DIAGNOSIS — Z9181 History of falling: Secondary | ICD-10-CM | POA: Diagnosis not present

## 2019-11-26 DIAGNOSIS — M069 Rheumatoid arthritis, unspecified: Secondary | ICD-10-CM | POA: Diagnosis not present

## 2019-11-26 DIAGNOSIS — Z7951 Long term (current) use of inhaled steroids: Secondary | ICD-10-CM | POA: Diagnosis not present

## 2019-11-26 DIAGNOSIS — N182 Chronic kidney disease, stage 2 (mild): Secondary | ICD-10-CM | POA: Diagnosis not present

## 2019-11-26 DIAGNOSIS — J310 Chronic rhinitis: Secondary | ICD-10-CM | POA: Diagnosis not present

## 2019-11-26 DIAGNOSIS — M3500 Sicca syndrome, unspecified: Secondary | ICD-10-CM | POA: Diagnosis not present

## 2019-11-26 DIAGNOSIS — I129 Hypertensive chronic kidney disease with stage 1 through stage 4 chronic kidney disease, or unspecified chronic kidney disease: Secondary | ICD-10-CM | POA: Diagnosis not present

## 2019-11-26 DIAGNOSIS — Z471 Aftercare following joint replacement surgery: Secondary | ICD-10-CM | POA: Diagnosis not present

## 2019-11-26 DIAGNOSIS — K219 Gastro-esophageal reflux disease without esophagitis: Secondary | ICD-10-CM | POA: Diagnosis not present

## 2019-11-26 DIAGNOSIS — F0390 Unspecified dementia without behavioral disturbance: Secondary | ICD-10-CM | POA: Diagnosis not present

## 2019-11-26 DIAGNOSIS — M810 Age-related osteoporosis without current pathological fracture: Secondary | ICD-10-CM | POA: Diagnosis not present

## 2019-11-26 DIAGNOSIS — G8929 Other chronic pain: Secondary | ICD-10-CM | POA: Diagnosis not present

## 2019-11-26 DIAGNOSIS — Z8701 Personal history of pneumonia (recurrent): Secondary | ICD-10-CM | POA: Diagnosis not present

## 2019-11-26 DIAGNOSIS — H2012 Chronic iridocyclitis, left eye: Secondary | ICD-10-CM | POA: Diagnosis not present

## 2019-11-26 DIAGNOSIS — Z7982 Long term (current) use of aspirin: Secondary | ICD-10-CM | POA: Diagnosis not present

## 2019-11-26 DIAGNOSIS — G43909 Migraine, unspecified, not intractable, without status migrainosus: Secondary | ICD-10-CM | POA: Diagnosis not present

## 2019-11-26 DIAGNOSIS — M419 Scoliosis, unspecified: Secondary | ICD-10-CM | POA: Diagnosis not present

## 2019-11-26 DIAGNOSIS — Z981 Arthrodesis status: Secondary | ICD-10-CM | POA: Diagnosis not present

## 2019-12-04 ENCOUNTER — Ambulatory Visit: Payer: PPO | Attending: Physician Assistant

## 2019-12-04 ENCOUNTER — Other Ambulatory Visit: Payer: Self-pay

## 2019-12-04 DIAGNOSIS — Z20822 Contact with and (suspected) exposure to covid-19: Secondary | ICD-10-CM | POA: Diagnosis not present

## 2019-12-06 DIAGNOSIS — J029 Acute pharyngitis, unspecified: Secondary | ICD-10-CM | POA: Diagnosis not present

## 2019-12-06 DIAGNOSIS — J449 Chronic obstructive pulmonary disease, unspecified: Secondary | ICD-10-CM | POA: Diagnosis not present

## 2019-12-06 DIAGNOSIS — Z6824 Body mass index (BMI) 24.0-24.9, adult: Secondary | ICD-10-CM | POA: Diagnosis not present

## 2019-12-10 DIAGNOSIS — H2012 Chronic iridocyclitis, left eye: Secondary | ICD-10-CM | POA: Diagnosis not present

## 2019-12-10 DIAGNOSIS — N182 Chronic kidney disease, stage 2 (mild): Secondary | ICD-10-CM | POA: Diagnosis not present

## 2019-12-10 DIAGNOSIS — M419 Scoliosis, unspecified: Secondary | ICD-10-CM | POA: Diagnosis not present

## 2019-12-10 DIAGNOSIS — Z7951 Long term (current) use of inhaled steroids: Secondary | ICD-10-CM | POA: Diagnosis not present

## 2019-12-10 DIAGNOSIS — F0390 Unspecified dementia without behavioral disturbance: Secondary | ICD-10-CM | POA: Diagnosis not present

## 2019-12-10 DIAGNOSIS — I129 Hypertensive chronic kidney disease with stage 1 through stage 4 chronic kidney disease, or unspecified chronic kidney disease: Secondary | ICD-10-CM | POA: Diagnosis not present

## 2019-12-10 DIAGNOSIS — G2581 Restless legs syndrome: Secondary | ICD-10-CM | POA: Diagnosis not present

## 2019-12-10 DIAGNOSIS — M810 Age-related osteoporosis without current pathological fracture: Secondary | ICD-10-CM | POA: Diagnosis not present

## 2019-12-10 DIAGNOSIS — Z8701 Personal history of pneumonia (recurrent): Secondary | ICD-10-CM | POA: Diagnosis not present

## 2019-12-10 DIAGNOSIS — Z981 Arthrodesis status: Secondary | ICD-10-CM | POA: Diagnosis not present

## 2019-12-10 DIAGNOSIS — F329 Major depressive disorder, single episode, unspecified: Secondary | ICD-10-CM | POA: Diagnosis not present

## 2019-12-10 DIAGNOSIS — M069 Rheumatoid arthritis, unspecified: Secondary | ICD-10-CM | POA: Diagnosis not present

## 2019-12-10 DIAGNOSIS — K219 Gastro-esophageal reflux disease without esophagitis: Secondary | ICD-10-CM | POA: Diagnosis not present

## 2019-12-10 DIAGNOSIS — J310 Chronic rhinitis: Secondary | ICD-10-CM | POA: Diagnosis not present

## 2019-12-10 DIAGNOSIS — J449 Chronic obstructive pulmonary disease, unspecified: Secondary | ICD-10-CM | POA: Diagnosis not present

## 2019-12-10 DIAGNOSIS — M3500 Sicca syndrome, unspecified: Secondary | ICD-10-CM | POA: Diagnosis not present

## 2019-12-10 DIAGNOSIS — F419 Anxiety disorder, unspecified: Secondary | ICD-10-CM | POA: Diagnosis not present

## 2019-12-10 DIAGNOSIS — Z471 Aftercare following joint replacement surgery: Secondary | ICD-10-CM | POA: Diagnosis not present

## 2019-12-10 DIAGNOSIS — Z79899 Other long term (current) drug therapy: Secondary | ICD-10-CM | POA: Diagnosis not present

## 2019-12-10 DIAGNOSIS — G8929 Other chronic pain: Secondary | ICD-10-CM | POA: Diagnosis not present

## 2019-12-10 DIAGNOSIS — Z7982 Long term (current) use of aspirin: Secondary | ICD-10-CM | POA: Diagnosis not present

## 2019-12-10 DIAGNOSIS — G43909 Migraine, unspecified, not intractable, without status migrainosus: Secondary | ICD-10-CM | POA: Diagnosis not present

## 2019-12-10 DIAGNOSIS — Z96641 Presence of right artificial hip joint: Secondary | ICD-10-CM | POA: Diagnosis not present

## 2019-12-10 DIAGNOSIS — Z9181 History of falling: Secondary | ICD-10-CM | POA: Diagnosis not present

## 2019-12-12 DIAGNOSIS — K219 Gastro-esophageal reflux disease without esophagitis: Secondary | ICD-10-CM | POA: Diagnosis not present

## 2019-12-12 DIAGNOSIS — G2581 Restless legs syndrome: Secondary | ICD-10-CM | POA: Diagnosis not present

## 2019-12-12 DIAGNOSIS — G8929 Other chronic pain: Secondary | ICD-10-CM | POA: Diagnosis not present

## 2019-12-12 DIAGNOSIS — Z79899 Other long term (current) drug therapy: Secondary | ICD-10-CM | POA: Diagnosis not present

## 2019-12-12 DIAGNOSIS — I129 Hypertensive chronic kidney disease with stage 1 through stage 4 chronic kidney disease, or unspecified chronic kidney disease: Secondary | ICD-10-CM | POA: Diagnosis not present

## 2019-12-12 DIAGNOSIS — Z8701 Personal history of pneumonia (recurrent): Secondary | ICD-10-CM | POA: Diagnosis not present

## 2019-12-12 DIAGNOSIS — G43909 Migraine, unspecified, not intractable, without status migrainosus: Secondary | ICD-10-CM | POA: Diagnosis not present

## 2019-12-12 DIAGNOSIS — M3500 Sicca syndrome, unspecified: Secondary | ICD-10-CM | POA: Diagnosis not present

## 2019-12-12 DIAGNOSIS — M419 Scoliosis, unspecified: Secondary | ICD-10-CM | POA: Diagnosis not present

## 2019-12-12 DIAGNOSIS — Z96641 Presence of right artificial hip joint: Secondary | ICD-10-CM | POA: Diagnosis not present

## 2019-12-12 DIAGNOSIS — Z981 Arthrodesis status: Secondary | ICD-10-CM | POA: Diagnosis not present

## 2019-12-12 DIAGNOSIS — M069 Rheumatoid arthritis, unspecified: Secondary | ICD-10-CM | POA: Diagnosis not present

## 2019-12-12 DIAGNOSIS — F0390 Unspecified dementia without behavioral disturbance: Secondary | ICD-10-CM | POA: Diagnosis not present

## 2019-12-12 DIAGNOSIS — J310 Chronic rhinitis: Secondary | ICD-10-CM | POA: Diagnosis not present

## 2019-12-12 DIAGNOSIS — Z7951 Long term (current) use of inhaled steroids: Secondary | ICD-10-CM | POA: Diagnosis not present

## 2019-12-12 DIAGNOSIS — F329 Major depressive disorder, single episode, unspecified: Secondary | ICD-10-CM | POA: Diagnosis not present

## 2019-12-12 DIAGNOSIS — H2012 Chronic iridocyclitis, left eye: Secondary | ICD-10-CM | POA: Diagnosis not present

## 2019-12-12 DIAGNOSIS — J449 Chronic obstructive pulmonary disease, unspecified: Secondary | ICD-10-CM | POA: Diagnosis not present

## 2019-12-12 DIAGNOSIS — Z471 Aftercare following joint replacement surgery: Secondary | ICD-10-CM | POA: Diagnosis not present

## 2019-12-12 DIAGNOSIS — Z7982 Long term (current) use of aspirin: Secondary | ICD-10-CM | POA: Diagnosis not present

## 2019-12-12 DIAGNOSIS — N182 Chronic kidney disease, stage 2 (mild): Secondary | ICD-10-CM | POA: Diagnosis not present

## 2019-12-12 DIAGNOSIS — Z9181 History of falling: Secondary | ICD-10-CM | POA: Diagnosis not present

## 2019-12-12 DIAGNOSIS — F419 Anxiety disorder, unspecified: Secondary | ICD-10-CM | POA: Diagnosis not present

## 2019-12-12 DIAGNOSIS — M810 Age-related osteoporosis without current pathological fracture: Secondary | ICD-10-CM | POA: Diagnosis not present

## 2019-12-17 DIAGNOSIS — Z96641 Presence of right artificial hip joint: Secondary | ICD-10-CM | POA: Diagnosis not present

## 2019-12-17 DIAGNOSIS — Z471 Aftercare following joint replacement surgery: Secondary | ICD-10-CM | POA: Diagnosis not present

## 2019-12-24 DIAGNOSIS — L298 Other pruritus: Secondary | ICD-10-CM | POA: Diagnosis not present

## 2019-12-24 DIAGNOSIS — Z79899 Other long term (current) drug therapy: Secondary | ICD-10-CM | POA: Diagnosis not present

## 2019-12-24 DIAGNOSIS — Z85828 Personal history of other malignant neoplasm of skin: Secondary | ICD-10-CM | POA: Diagnosis not present

## 2020-01-03 ENCOUNTER — Other Ambulatory Visit: Payer: Self-pay

## 2020-01-03 ENCOUNTER — Encounter (HOSPITAL_COMMUNITY): Payer: Self-pay

## 2020-01-03 DIAGNOSIS — J45909 Unspecified asthma, uncomplicated: Secondary | ICD-10-CM | POA: Insufficient documentation

## 2020-01-03 DIAGNOSIS — Z6823 Body mass index (BMI) 23.0-23.9, adult: Secondary | ICD-10-CM | POA: Diagnosis not present

## 2020-01-03 DIAGNOSIS — M47816 Spondylosis without myelopathy or radiculopathy, lumbar region: Secondary | ICD-10-CM | POA: Diagnosis not present

## 2020-01-03 DIAGNOSIS — G43909 Migraine, unspecified, not intractable, without status migrainosus: Secondary | ICD-10-CM | POA: Diagnosis not present

## 2020-01-03 DIAGNOSIS — R109 Unspecified abdominal pain: Secondary | ICD-10-CM | POA: Diagnosis not present

## 2020-01-03 DIAGNOSIS — K3580 Unspecified acute appendicitis: Principal | ICD-10-CM | POA: Insufficient documentation

## 2020-01-03 DIAGNOSIS — F329 Major depressive disorder, single episode, unspecified: Secondary | ICD-10-CM | POA: Insufficient documentation

## 2020-01-03 DIAGNOSIS — F0390 Unspecified dementia without behavioral disturbance: Secondary | ICD-10-CM | POA: Insufficient documentation

## 2020-01-03 DIAGNOSIS — Z79899 Other long term (current) drug therapy: Secondary | ICD-10-CM | POA: Diagnosis not present

## 2020-01-03 DIAGNOSIS — N183 Chronic kidney disease, stage 3 unspecified: Secondary | ICD-10-CM | POA: Diagnosis not present

## 2020-01-03 DIAGNOSIS — I10 Essential (primary) hypertension: Secondary | ICD-10-CM | POA: Diagnosis not present

## 2020-01-03 DIAGNOSIS — M1990 Unspecified osteoarthritis, unspecified site: Secondary | ICD-10-CM | POA: Diagnosis not present

## 2020-01-03 DIAGNOSIS — I700 Atherosclerosis of aorta: Secondary | ICD-10-CM | POA: Diagnosis not present

## 2020-01-03 DIAGNOSIS — I129 Hypertensive chronic kidney disease with stage 1 through stage 4 chronic kidney disease, or unspecified chronic kidney disease: Secondary | ICD-10-CM | POA: Insufficient documentation

## 2020-01-03 DIAGNOSIS — G8929 Other chronic pain: Secondary | ICD-10-CM | POA: Diagnosis not present

## 2020-01-03 DIAGNOSIS — Z20822 Contact with and (suspected) exposure to covid-19: Secondary | ICD-10-CM | POA: Diagnosis not present

## 2020-01-03 DIAGNOSIS — J449 Chronic obstructive pulmonary disease, unspecified: Secondary | ICD-10-CM | POA: Diagnosis not present

## 2020-01-03 DIAGNOSIS — K219 Gastro-esophageal reflux disease without esophagitis: Secondary | ICD-10-CM | POA: Insufficient documentation

## 2020-01-03 DIAGNOSIS — M545 Low back pain: Secondary | ICD-10-CM | POA: Diagnosis not present

## 2020-01-03 DIAGNOSIS — M1612 Unilateral primary osteoarthritis, left hip: Secondary | ICD-10-CM | POA: Diagnosis not present

## 2020-01-03 DIAGNOSIS — K5730 Diverticulosis of large intestine without perforation or abscess without bleeding: Secondary | ICD-10-CM | POA: Diagnosis not present

## 2020-01-03 DIAGNOSIS — R000 Tachycardia, unspecified: Secondary | ICD-10-CM | POA: Diagnosis not present

## 2020-01-03 LAB — COMPREHENSIVE METABOLIC PANEL
ALT: 12 U/L (ref 0–44)
AST: 18 U/L (ref 15–41)
Albumin: 4.2 g/dL (ref 3.5–5.0)
Alkaline Phosphatase: 81 U/L (ref 38–126)
Anion gap: 10 (ref 5–15)
BUN: 14 mg/dL (ref 8–23)
CO2: 25 mmol/L (ref 22–32)
Calcium: 9.3 mg/dL (ref 8.9–10.3)
Chloride: 101 mmol/L (ref 98–111)
Creatinine, Ser: 0.75 mg/dL (ref 0.44–1.00)
GFR calc Af Amer: >60 mL/min (ref >60)
GFR calc non Af Amer: >60 mL/min (ref >60)
Glucose, Bld: 101 mg/dL — ABNORMAL HIGH (ref 70–99)
Potassium: 3.7 mmol/L (ref 3.5–5.1)
Sodium: 136 mmol/L (ref 135–145)
Total Bilirubin: 1.3 mg/dL — ABNORMAL HIGH (ref 0.3–1.2)
Total Protein: 8.2 g/dL — ABNORMAL HIGH (ref 6.5–8.1)

## 2020-01-03 LAB — CBC
HCT: 35.5 % — ABNORMAL LOW (ref 36.0–46.0)
Hemoglobin: 11.2 g/dL — ABNORMAL LOW (ref 12.0–15.0)
MCH: 28.5 pg (ref 26.0–34.0)
MCHC: 31.5 g/dL (ref 30.0–36.0)
MCV: 90.3 fL (ref 80.0–100.0)
Platelets: 360 10*3/uL (ref 150–400)
RBC: 3.93 MIL/uL (ref 3.87–5.11)
RDW: 14 % (ref 11.5–15.5)
WBC: 6.6 10*3/uL (ref 4.0–10.5)
nRBC: 0 % (ref 0.0–0.2)

## 2020-01-03 LAB — LIPASE, BLOOD: Lipase: 24 U/L (ref 11–51)

## 2020-01-03 NOTE — ED Triage Notes (Addendum)
Patient c/o RLQ pain, emesis, and fever since 030 today. Patient went to her PCp today and was told to come to the ED to r/o appendicitis.

## 2020-01-04 ENCOUNTER — Emergency Department (HOSPITAL_COMMUNITY): Payer: PPO

## 2020-01-04 ENCOUNTER — Observation Stay (HOSPITAL_COMMUNITY)
Admission: EM | Admit: 2020-01-04 | Discharge: 2020-01-06 | Disposition: A | Discharge status: Home / Self Care | Payer: PPO | Attending: Surgery | Admitting: Surgery

## 2020-01-04 ENCOUNTER — Emergency Department (HOSPITAL_COMMUNITY): Payer: PPO | Admitting: Anesthesiology

## 2020-01-04 ENCOUNTER — Encounter (HOSPITAL_COMMUNITY)
Admission: EM | Disposition: A | Discharge status: Home / Self Care | Payer: Self-pay | Source: Home / Self Care | Attending: Emergency Medicine

## 2020-01-04 DIAGNOSIS — K5730 Diverticulosis of large intestine without perforation or abscess without bleeding: Secondary | ICD-10-CM | POA: Diagnosis not present

## 2020-01-04 DIAGNOSIS — K3589 Other acute appendicitis without perforation or gangrene: Secondary | ICD-10-CM | POA: Insufficient documentation

## 2020-01-04 DIAGNOSIS — M069 Rheumatoid arthritis, unspecified: Secondary | ICD-10-CM | POA: Diagnosis present

## 2020-01-04 DIAGNOSIS — M3500 Sicca syndrome, unspecified: Secondary | ICD-10-CM | POA: Diagnosis present

## 2020-01-04 DIAGNOSIS — K219 Gastro-esophageal reflux disease without esophagitis: Secondary | ICD-10-CM | POA: Diagnosis present

## 2020-01-04 DIAGNOSIS — K3580 Unspecified acute appendicitis: Secondary | ICD-10-CM | POA: Diagnosis not present

## 2020-01-04 DIAGNOSIS — Z20822 Contact with and (suspected) exposure to covid-19: Secondary | ICD-10-CM | POA: Diagnosis not present

## 2020-01-04 DIAGNOSIS — F32A Depression, unspecified: Secondary | ICD-10-CM | POA: Diagnosis present

## 2020-01-04 DIAGNOSIS — M810 Age-related osteoporosis without current pathological fracture: Secondary | ICD-10-CM | POA: Insufficient documentation

## 2020-01-04 DIAGNOSIS — G8929 Other chronic pain: Secondary | ICD-10-CM | POA: Diagnosis not present

## 2020-01-04 DIAGNOSIS — J45909 Unspecified asthma, uncomplicated: Secondary | ICD-10-CM | POA: Diagnosis not present

## 2020-01-04 DIAGNOSIS — F0390 Unspecified dementia without behavioral disturbance: Secondary | ICD-10-CM | POA: Insufficient documentation

## 2020-01-04 DIAGNOSIS — J449 Chronic obstructive pulmonary disease, unspecified: Secondary | ICD-10-CM | POA: Diagnosis not present

## 2020-01-04 DIAGNOSIS — I129 Hypertensive chronic kidney disease with stage 1 through stage 4 chronic kidney disease, or unspecified chronic kidney disease: Secondary | ICD-10-CM | POA: Diagnosis not present

## 2020-01-04 DIAGNOSIS — M47816 Spondylosis without myelopathy or radiculopathy, lumbar region: Secondary | ICD-10-CM | POA: Diagnosis not present

## 2020-01-04 DIAGNOSIS — K353 Acute appendicitis with localized peritonitis, without perforation or gangrene: Secondary | ICD-10-CM | POA: Diagnosis not present

## 2020-01-04 DIAGNOSIS — M1611 Unilateral primary osteoarthritis, right hip: Secondary | ICD-10-CM | POA: Diagnosis present

## 2020-01-04 DIAGNOSIS — F418 Other specified anxiety disorders: Secondary | ICD-10-CM | POA: Diagnosis not present

## 2020-01-04 DIAGNOSIS — M5136 Other intervertebral disc degeneration, lumbar region: Secondary | ICD-10-CM | POA: Diagnosis present

## 2020-01-04 DIAGNOSIS — M1990 Unspecified osteoarthritis, unspecified site: Secondary | ICD-10-CM | POA: Diagnosis not present

## 2020-01-04 DIAGNOSIS — M8580 Other specified disorders of bone density and structure, unspecified site: Secondary | ICD-10-CM | POA: Insufficient documentation

## 2020-01-04 DIAGNOSIS — N183 Chronic kidney disease, stage 3 unspecified: Secondary | ICD-10-CM | POA: Diagnosis not present

## 2020-01-04 DIAGNOSIS — N182 Chronic kidney disease, stage 2 (mild): Secondary | ICD-10-CM | POA: Diagnosis present

## 2020-01-04 DIAGNOSIS — G43909 Migraine, unspecified, not intractable, without status migrainosus: Secondary | ICD-10-CM | POA: Diagnosis not present

## 2020-01-04 DIAGNOSIS — J310 Chronic rhinitis: Secondary | ICD-10-CM | POA: Diagnosis present

## 2020-01-04 DIAGNOSIS — I10 Essential (primary) hypertension: Secondary | ICD-10-CM | POA: Diagnosis present

## 2020-01-04 DIAGNOSIS — M51369 Other intervertebral disc degeneration, lumbar region without mention of lumbar back pain or lower extremity pain: Secondary | ICD-10-CM | POA: Diagnosis present

## 2020-01-04 DIAGNOSIS — M1612 Unilateral primary osteoarthritis, left hip: Secondary | ICD-10-CM | POA: Diagnosis not present

## 2020-01-04 DIAGNOSIS — F329 Major depressive disorder, single episode, unspecified: Secondary | ICD-10-CM | POA: Diagnosis present

## 2020-01-04 DIAGNOSIS — J9601 Acute respiratory failure with hypoxia: Secondary | ICD-10-CM | POA: Diagnosis not present

## 2020-01-04 DIAGNOSIS — G2581 Restless legs syndrome: Secondary | ICD-10-CM | POA: Diagnosis present

## 2020-01-04 DIAGNOSIS — I700 Atherosclerosis of aorta: Secondary | ICD-10-CM | POA: Diagnosis not present

## 2020-01-04 DIAGNOSIS — M545 Low back pain: Secondary | ICD-10-CM | POA: Diagnosis not present

## 2020-01-04 HISTORY — PX: LAPAROSCOPIC APPENDECTOMY: SHX408

## 2020-01-04 LAB — URINALYSIS, ROUTINE W REFLEX MICROSCOPIC
Bilirubin Urine: NEGATIVE
Glucose, UA: NEGATIVE mg/dL
Hgb urine dipstick: NEGATIVE
Ketones, ur: 5 mg/dL — AB
Leukocytes,Ua: NEGATIVE
Nitrite: NEGATIVE
Protein, ur: NEGATIVE mg/dL
Specific Gravity, Urine: 1.029 (ref 1.005–1.030)
pH: 5 (ref 5.0–8.0)

## 2020-01-04 LAB — SARS CORONAVIRUS 2 BY RT PCR (HOSPITAL ORDER, PERFORMED IN ~~LOC~~ HOSPITAL LAB): SARS Coronavirus 2: NEGATIVE

## 2020-01-04 SURGERY — APPENDECTOMY, LAPAROSCOPIC
Anesthesia: General | Site: Abdomen

## 2020-01-04 MED ORDER — PROPOFOL 10 MG/ML IV BOLUS
INTRAVENOUS | Status: DC | PRN
  Administered 2020-01-04: 50 mg via INTRAVENOUS
  Administered 2020-01-04: 150 mg via INTRAVENOUS

## 2020-01-04 MED ORDER — BISACODYL 10 MG RE SUPP
10.0000 mg | Freq: Every day | RECTAL | Status: DC | PRN
  Filled 2020-01-04: qty 1

## 2020-01-04 MED ORDER — ZOLPIDEM TARTRATE 5 MG PO TABS: 5.0000 mg | ORAL_TABLET | Freq: Every evening | ORAL | Status: DC | PRN

## 2020-01-04 MED ORDER — CHLORHEXIDINE GLUCONATE CLOTH 2 % EX PADS: 6.0000 | MEDICATED_PAD | Freq: Once | CUTANEOUS | Status: DC

## 2020-01-04 MED ORDER — ALBUTEROL SULFATE HFA 108 (90 BASE) MCG/ACT IN AERS: 2.0000 | INHALATION_SPRAY | Freq: Four times a day (QID) | RESPIRATORY_TRACT | Status: DC | PRN

## 2020-01-04 MED ORDER — PROPOFOL 10 MG/ML IV BOLUS
INTRAVENOUS | Status: AC
  Filled 2020-01-04: qty 20

## 2020-01-04 MED ORDER — LACTATED RINGERS IV SOLN: INTRAVENOUS | Status: DC | PRN

## 2020-01-04 MED ORDER — SIMETHICONE 80 MG PO CHEW
40.0000 mg | CHEWABLE_TABLET | Freq: Four times a day (QID) | ORAL | Status: DC | PRN
  Filled 2020-01-04: qty 1

## 2020-01-04 MED ORDER — SODIUM CHLORIDE 0.9 % IV BOLUS
500.0000 mL | Freq: Once | INTRAVENOUS | Status: AC
  Administered 2020-01-04: 500 mL via INTRAVENOUS

## 2020-01-04 MED ORDER — METRONIDAZOLE IN NACL 5-0.79 MG/ML-% IV SOLN
500.0000 mg | Freq: Once | INTRAVENOUS | Status: AC
  Administered 2020-01-04: 500 mg via INTRAVENOUS
  Filled 2020-01-04: qty 100

## 2020-01-04 MED ORDER — LIDOCAINE 2% (20 MG/ML) 5 ML SYRINGE
INTRAMUSCULAR | Status: DC | PRN
  Administered 2020-01-04: 60 mg via INTRAVENOUS

## 2020-01-04 MED ORDER — OXYCODONE HCL 5 MG/5ML PO SOLN: 5.0000 mg | Freq: Once | ORAL | Status: DC | PRN

## 2020-01-04 MED ORDER — OXYCODONE HCL 5 MG PO TABS: 5.0000 mg | ORAL_TABLET | Freq: Four times a day (QID) | ORAL | Status: DC | PRN

## 2020-01-04 MED ORDER — FENTANYL CITRATE (PF) 100 MCG/2ML IJ SOLN
INTRAMUSCULAR | Status: DC | PRN
  Administered 2020-01-04 (×4): 50 ug via INTRAVENOUS

## 2020-01-04 MED ORDER — SODIUM CHLORIDE 0.9% FLUSH: 3.0000 mL | INTRAVENOUS | Status: DC | PRN

## 2020-01-04 MED ORDER — SODIUM CHLORIDE (PF) 0.9 % IJ SOLN
INTRAMUSCULAR | Status: AC
  Filled 2020-01-04: qty 50

## 2020-01-04 MED ORDER — ALBUTEROL SULFATE (2.5 MG/3ML) 0.083% IN NEBU: 2.5000 mg | INHALATION_SOLUTION | RESPIRATORY_TRACT | Status: DC | PRN

## 2020-01-04 MED ORDER — METHOCARBAMOL 500 MG PO TABS: 500.0000 mg | ORAL_TABLET | Freq: Four times a day (QID) | ORAL | Status: DC | PRN

## 2020-01-04 MED ORDER — ROCURONIUM BROMIDE 10 MG/ML (PF) SYRINGE
PREFILLED_SYRINGE | INTRAVENOUS | Status: DC | PRN
  Administered 2020-01-04: 50 mg via INTRAVENOUS
  Administered 2020-01-04: 10 mg via INTRAVENOUS

## 2020-01-04 MED ORDER — ONDANSETRON HCL 4 MG/2ML IJ SOLN: 4.0000 mg | Freq: Four times a day (QID) | INTRAMUSCULAR | Status: DC | PRN

## 2020-01-04 MED ORDER — HYDROMORPHONE HCL 1 MG/ML IJ SOLN: 0.2500 mg | INTRAMUSCULAR | Status: DC | PRN

## 2020-01-04 MED ORDER — SODIUM CHLORIDE 0.9 % IV SOLN
2.0000 g | INTRAVENOUS | Status: AC
  Administered 2020-01-04: 2 g via INTRAVENOUS
  Filled 2020-01-04: qty 2

## 2020-01-04 MED ORDER — STERILE WATER FOR IRRIGATION IR SOLN
Status: DC | PRN
  Administered 2020-01-04: 1000 mL

## 2020-01-04 MED ORDER — SODIUM CHLORIDE 0.9 % IV SOLN
2.0000 g | Freq: Once | INTRAVENOUS | Status: AC
  Administered 2020-01-04: 2 g via INTRAVENOUS
  Filled 2020-01-04: qty 20

## 2020-01-04 MED ORDER — GABAPENTIN 300 MG PO CAPS
300.0000 mg | ORAL_CAPSULE | Freq: Two times a day (BID) | ORAL | Status: DC
  Administered 2020-01-05 – 2020-01-06 (×2): 300 mg via ORAL
  Filled 2020-01-04 (×4): qty 1

## 2020-01-04 MED ORDER — ACETAMINOPHEN 500 MG PO TABS
1000.0000 mg | ORAL_TABLET | Freq: Three times a day (TID) | ORAL | Status: DC
  Administered 2020-01-04 – 2020-01-06 (×5): 1000 mg via ORAL
  Filled 2020-01-04 (×5): qty 2

## 2020-01-04 MED ORDER — METRONIDAZOLE IN NACL 5-0.79 MG/ML-% IV SOLN
500.0000 mg | Freq: Three times a day (TID) | INTRAVENOUS | Status: DC
  Administered 2020-01-04 – 2020-01-06 (×5): 500 mg via INTRAVENOUS
  Filled 2020-01-04 (×6): qty 100

## 2020-01-04 MED ORDER — BUPIVACAINE HCL 0.25 % IJ SOLN
INTRAMUSCULAR | Status: AC
  Filled 2020-01-04: qty 1

## 2020-01-04 MED ORDER — PROCHLORPERAZINE EDISYLATE 10 MG/2ML IJ SOLN: 5.0000 mg | Freq: Four times a day (QID) | INTRAMUSCULAR | Status: DC | PRN

## 2020-01-04 MED ORDER — GABAPENTIN 300 MG PO CAPS: 300.0000 mg | ORAL_CAPSULE | ORAL | Status: DC

## 2020-01-04 MED ORDER — ACETAMINOPHEN 500 MG PO TABS: 1000.0000 mg | ORAL_TABLET | ORAL | Status: DC

## 2020-01-04 MED ORDER — ACETAMINOPHEN 10 MG/ML IV SOLN
INTRAVENOUS | Status: AC
  Filled 2020-01-04: qty 100

## 2020-01-04 MED ORDER — ACETAMINOPHEN 325 MG PO TABS
650.0000 mg | ORAL_TABLET | Freq: Once | ORAL | Status: AC
  Administered 2020-01-04: 650 mg via ORAL
  Filled 2020-01-04: qty 2

## 2020-01-04 MED ORDER — DIPHENHYDRAMINE HCL 12.5 MG/5ML PO ELIX
12.5000 mg | ORAL_SOLUTION | Freq: Four times a day (QID) | ORAL | Status: DC | PRN
  Filled 2020-01-04: qty 5

## 2020-01-04 MED ORDER — ESCITALOPRAM OXALATE 10 MG PO TABS
10.0000 mg | ORAL_TABLET | Freq: Every day | ORAL | Status: DC
  Administered 2020-01-04 – 2020-01-06 (×3): 10 mg via ORAL
  Filled 2020-01-04 (×3): qty 1

## 2020-01-04 MED ORDER — ONDANSETRON 4 MG PO TBDP: 4.0000 mg | ORAL_TABLET | Freq: Four times a day (QID) | ORAL | Status: DC | PRN

## 2020-01-04 MED ORDER — SODIUM CHLORIDE 0.9% FLUSH
3.0000 mL | Freq: Two times a day (BID) | INTRAVENOUS | Status: DC
  Administered 2020-01-05 – 2020-01-06 (×2): 3 mL via INTRAVENOUS

## 2020-01-04 MED ORDER — GABAPENTIN 300 MG PO CAPS
600.0000 mg | ORAL_CAPSULE | Freq: Every day | ORAL | Status: DC
  Administered 2020-01-04 – 2020-01-05 (×2): 600 mg via ORAL
  Filled 2020-01-04: qty 2

## 2020-01-04 MED ORDER — BUPIVACAINE LIPOSOME 1.3 % IJ SUSP
INTRAMUSCULAR | Status: DC | PRN
  Administered 2020-01-04: 20 mL

## 2020-01-04 MED ORDER — DEXMEDETOMIDINE (PRECEDEX) IN NS 20 MCG/5ML (4 MCG/ML) IV SYRINGE
PREFILLED_SYRINGE | INTRAVENOUS | Status: AC
  Filled 2020-01-04: qty 5

## 2020-01-04 MED ORDER — POLYETHYLENE GLYCOL 3350 17 G PO PACK
17.0000 g | PACK | Freq: Every day | ORAL | Status: DC | PRN
  Filled 2020-01-04: qty 1

## 2020-01-04 MED ORDER — LORATADINE 10 MG PO TABS
10.0000 mg | ORAL_TABLET | Freq: Every day | ORAL | Status: DC
  Administered 2020-01-05 – 2020-01-06 (×2): 10 mg via ORAL
  Filled 2020-01-04 (×2): qty 1

## 2020-01-04 MED ORDER — ESMOLOL HCL 100 MG/10ML IV SOLN
INTRAVENOUS | Status: DC | PRN
  Administered 2020-01-04: 20 mg via INTRAVENOUS
  Administered 2020-01-04: 30 mg via INTRAVENOUS

## 2020-01-04 MED ORDER — OXYCODONE HCL 5 MG PO TABS: 5.0000 mg | ORAL_TABLET | Freq: Once | ORAL | Status: DC | PRN

## 2020-01-04 MED ORDER — ENOXAPARIN SODIUM 40 MG/0.4ML ~~LOC~~ SOLN
40.0000 mg | SUBCUTANEOUS | Status: DC
  Administered 2020-01-05 – 2020-01-06 (×2): 40 mg via SUBCUTANEOUS
  Filled 2020-01-04 (×2): qty 0.4

## 2020-01-04 MED ORDER — BUPIVACAINE-EPINEPHRINE (PF) 0.5% -1:200000 IJ SOLN
INTRAMUSCULAR | Status: AC
  Filled 2020-01-04: qty 30

## 2020-01-04 MED ORDER — FOLIC ACID 1 MG PO TABS
2.0000 mg | ORAL_TABLET | Freq: Every day | ORAL | Status: DC
  Administered 2020-01-04 – 2020-01-06 (×3): 2 mg via ORAL
  Filled 2020-01-04 (×3): qty 2

## 2020-01-04 MED ORDER — METHOTREXATE SODIUM CHEMO INJECTION 250 MG/10ML: 15.0000 mg | INTRAMUSCULAR | Status: DC

## 2020-01-04 MED ORDER — SODIUM CHLORIDE 0.9 % IV SOLN: INTRAVENOUS | Status: DC

## 2020-01-04 MED ORDER — PROCHLORPERAZINE MALEATE 10 MG PO TABS
10.0000 mg | ORAL_TABLET | Freq: Four times a day (QID) | ORAL | Status: DC | PRN
  Filled 2020-01-04: qty 1

## 2020-01-04 MED ORDER — HYDROXYZINE HCL 25 MG PO TABS: 25.0000 mg | ORAL_TABLET | Freq: Three times a day (TID) | ORAL | Status: DC | PRN

## 2020-01-04 MED ORDER — SUGAMMADEX SODIUM 200 MG/2ML IV SOLN
INTRAVENOUS | Status: DC | PRN
  Administered 2020-01-04: 200 mg via INTRAVENOUS

## 2020-01-04 MED ORDER — LACTATED RINGERS IV SOLN
INTRAVENOUS | Status: AC | PRN
  Administered 2020-01-04: 1000 mL

## 2020-01-04 MED ORDER — SODIUM CHLORIDE 0.9 % IV SOLN
2.0000 g | INTRAVENOUS | Status: DC
  Administered 2020-01-05: 2 g via INTRAVENOUS
  Filled 2020-01-04: qty 20
  Filled 2020-01-04: qty 2

## 2020-01-04 MED ORDER — BUPIVACAINE-EPINEPHRINE (PF) 0.5% -1:200000 IJ SOLN
INTRAMUSCULAR | Status: DC | PRN
  Administered 2020-01-04: 30 mL

## 2020-01-04 MED ORDER — PHENYLEPHRINE 40 MCG/ML (10ML) SYRINGE FOR IV PUSH (FOR BLOOD PRESSURE SUPPORT)
PREFILLED_SYRINGE | INTRAVENOUS | Status: DC | PRN
  Administered 2020-01-04: 80 ug via INTRAVENOUS
  Administered 2020-01-04: 160 ug via INTRAVENOUS

## 2020-01-04 MED ORDER — CYCLOBENZAPRINE HCL 10 MG PO TABS: 10.0000 mg | ORAL_TABLET | Freq: Every evening | ORAL | Status: DC | PRN

## 2020-01-04 MED ORDER — FENTANYL CITRATE (PF) 100 MCG/2ML IJ SOLN
INTRAMUSCULAR | Status: AC
  Filled 2020-01-04: qty 2

## 2020-01-04 MED ORDER — METOPROLOL TARTRATE 5 MG/5ML IV SOLN: 5.0000 mg | Freq: Four times a day (QID) | INTRAVENOUS | Status: DC | PRN

## 2020-01-04 MED ORDER — DIPHENHYDRAMINE HCL 50 MG/ML IJ SOLN: 12.5000 mg | Freq: Four times a day (QID) | INTRAMUSCULAR | Status: DC | PRN

## 2020-01-04 MED ORDER — CYCLOBENZAPRINE HCL 10 MG PO TABS: 10.0000 mg | ORAL_TABLET | Freq: Three times a day (TID) | ORAL | Status: DC | PRN

## 2020-01-04 MED ORDER — METOPROLOL SUCCINATE ER 25 MG PO TB24
25.0000 mg | ORAL_TABLET | Freq: Every day | ORAL | Status: DC
  Administered 2020-01-04 – 2020-01-06 (×3): 25 mg via ORAL
  Filled 2020-01-04 (×3): qty 1

## 2020-01-04 MED ORDER — ESMOLOL HCL 100 MG/10ML IV SOLN
INTRAVENOUS | Status: AC
  Filled 2020-01-04: qty 10

## 2020-01-04 MED ORDER — KETOROLAC TROMETHAMINE 15 MG/ML IJ SOLN
INTRAMUSCULAR | Status: DC | PRN
  Administered 2020-01-04: 15 mg via INTRAVENOUS

## 2020-01-04 MED ORDER — DEXAMETHASONE SODIUM PHOSPHATE 10 MG/ML IJ SOLN
INTRAMUSCULAR | Status: DC | PRN
  Administered 2020-01-04: 10 mg via INTRAVENOUS

## 2020-01-04 MED ORDER — BUPIVACAINE LIPOSOME 1.3 % IJ SUSP
20.0000 mL | Freq: Once | INTRAMUSCULAR | Status: DC
  Filled 2020-01-04: qty 20

## 2020-01-04 MED ORDER — LACTATED RINGERS IV BOLUS: 1000.0000 mL | Freq: Three times a day (TID) | INTRAVENOUS | Status: DC | PRN

## 2020-01-04 MED ORDER — IOHEXOL 300 MG/ML  SOLN
100.0000 mL | Freq: Once | INTRAMUSCULAR | Status: AC | PRN
  Administered 2020-01-04: 100 mL via INTRAVENOUS

## 2020-01-04 MED ORDER — ONDANSETRON HCL 4 MG/2ML IJ SOLN
INTRAMUSCULAR | Status: DC | PRN
  Administered 2020-01-04: 4 mg via INTRAVENOUS

## 2020-01-04 MED ORDER — HYDROMORPHONE HCL 1 MG/ML IJ SOLN: 0.5000 mg | INTRAMUSCULAR | Status: DC | PRN

## 2020-01-04 MED ORDER — ONDANSETRON HCL 4 MG/2ML IJ SOLN: 4.0000 mg | Freq: Once | INTRAMUSCULAR | Status: DC | PRN

## 2020-01-04 MED ORDER — LACTATED RINGERS IV SOLN: 1000.0000 mL | Freq: Three times a day (TID) | INTRAVENOUS | Status: DC | PRN

## 2020-01-04 MED ORDER — SODIUM CHLORIDE 0.9 % IV SOLN: 250.0000 mL | INTRAVENOUS | Status: DC | PRN

## 2020-01-04 MED ORDER — MOMETASONE FURO-FORMOTEROL FUM 200-5 MCG/ACT IN AERO
2.0000 | INHALATION_SPRAY | Freq: Two times a day (BID) | RESPIRATORY_TRACT | Status: DC
  Administered 2020-01-05 – 2020-01-06 (×3): 2 via RESPIRATORY_TRACT
  Filled 2020-01-04: qty 8.8

## 2020-01-04 SURGICAL SUPPLY — 54 items
APL PRP STRL LF DISP 70% ISPRP (MISCELLANEOUS) ×1
APPLIER CLIP 5 13 M/L LIGAMAX5 (MISCELLANEOUS)
APPLIER CLIP ROT 10 11.4 M/L (STAPLE)
APR CLP MED LRG 11.4X10 (STAPLE)
APR CLP MED LRG 5 ANG JAW (MISCELLANEOUS)
BAG SPEC RTRVL 10 TROC 200 (ENDOMECHANICALS) ×1
CABLE HIGH FREQUENCY MONO STRZ (ELECTRODE) ×3 IMPLANT
CHLORAPREP W/TINT 26 (MISCELLANEOUS) ×3 IMPLANT
CLIP APPLIE 5 13 M/L LIGAMAX5 (MISCELLANEOUS) IMPLANT
CLIP APPLIE ROT 10 11.4 M/L (STAPLE) IMPLANT
COVER SURGICAL LIGHT HANDLE (MISCELLANEOUS) ×3 IMPLANT
COVER WAND RF STERILE (DRAPES) IMPLANT
DECANTER SPIKE VIAL GLASS SM (MISCELLANEOUS) ×3 IMPLANT
DEVICE TROCAR PUNCTURE CLOSURE (ENDOMECHANICALS) ×2 IMPLANT
DRAPE LAPAROSCOPIC ABDOMINAL (DRAPES) ×3 IMPLANT
DRAPE WARM FLUID 44X44 (DRAPES) ×3 IMPLANT
DRSG TEGADERM 2-3/8X2-3/4 SM (GAUZE/BANDAGES/DRESSINGS) ×6 IMPLANT
DRSG TEGADERM 4X4.75 (GAUZE/BANDAGES/DRESSINGS) ×3 IMPLANT
ELECT REM PT RETURN 15FT ADLT (MISCELLANEOUS) ×3 IMPLANT
ENDOLOOP SUT PDS II  0 18 (SUTURE)
ENDOLOOP SUT PDS II 0 18 (SUTURE) IMPLANT
GAUZE SPONGE 2X2 8PLY STRL LF (GAUZE/BANDAGES/DRESSINGS) ×1 IMPLANT
GLOVE ECLIPSE 8.0 STRL XLNG CF (GLOVE) ×3 IMPLANT
GLOVE INDICATOR 8.0 STRL GRN (GLOVE) ×3 IMPLANT
GOWN STRL REUS W/TWL XL LVL3 (GOWN DISPOSABLE) ×6 IMPLANT
IRRIG SUCT STRYKERFLOW 2 WTIP (MISCELLANEOUS) ×3
IRRIGATION SUCT STRKRFLW 2 WTP (MISCELLANEOUS) ×1 IMPLANT
KIT BASIN OR (CUSTOM PROCEDURE TRAY) ×3 IMPLANT
KIT TURNOVER KIT A (KITS) IMPLANT
PAD POSITIONING PINK XL (MISCELLANEOUS) ×3 IMPLANT
PENCIL SMOKE EVACUATOR (MISCELLANEOUS) IMPLANT
POUCH RETRIEVAL ECOSAC 10 (ENDOMECHANICALS) ×1 IMPLANT
POUCH RETRIEVAL ECOSAC 10MM (ENDOMECHANICALS) ×3
RELOAD STAPLE 60 3.6 BLU REG (STAPLE) IMPLANT
RELOAD STAPLE 60 4.1 GRN THCK (STAPLE) IMPLANT
RELOAD STAPLER BLUE 60MM (STAPLE) ×1 IMPLANT
RELOAD STAPLER GREEN 60MM (STAPLE) IMPLANT
SCISSORS LAP 5X35 DISP (ENDOMECHANICALS) ×3 IMPLANT
SET TUBE SMOKE EVAC HIGH FLOW (TUBING) ×3 IMPLANT
SHEARS HARMONIC ACE PLUS 36CM (ENDOMECHANICALS) ×2 IMPLANT
SLEEVE XCEL OPT CAN 5 100 (ENDOMECHANICALS) ×3 IMPLANT
SPONGE GAUZE 2X2 STER 10/PKG (GAUZE/BANDAGES/DRESSINGS) ×2
STAPLE ECHEON FLEX 60 POW ENDO (STAPLE) ×2 IMPLANT
STAPLER RELOAD BLUE 60MM (STAPLE) ×3
STAPLER RELOAD GREEN 60MM (STAPLE)
SUT MNCRL AB 4-0 PS2 18 (SUTURE) ×3 IMPLANT
SUT PDS AB 0 CT1 36 (SUTURE) IMPLANT
SUT PDS AB 1 CT1 27 (SUTURE) IMPLANT
SUT SILK 2 0 SH (SUTURE) IMPLANT
TOWEL OR 17X26 10 PK STRL BLUE (TOWEL DISPOSABLE) ×3 IMPLANT
TRAY FOLEY MTR SLVR 16FR STAT (SET/KITS/TRAYS/PACK) IMPLANT
TRAY LAPAROSCOPIC (CUSTOM PROCEDURE TRAY) ×3 IMPLANT
TROCAR BLADELESS OPT 5 100 (ENDOMECHANICALS) ×3 IMPLANT
TROCAR XCEL 12X100 BLDLESS (ENDOMECHANICALS) ×3 IMPLANT

## 2020-01-04 NOTE — Anesthesia Postprocedure Evaluation (Signed)
Anesthesia Post Note  Patient: Toni Myers  Procedure(s) Performed: APPENDECTOMY LAPAROSCOPIC (N/A Abdomen)     Patient location during evaluation: PACU Anesthesia Type: General Level of consciousness: awake and alert, oriented and patient cooperative Pain management: pain level controlled Vital Signs Assessment: post-procedure vital signs reviewed and stable Respiratory status: spontaneous breathing, nonlabored ventilation and respiratory function stable Cardiovascular status: blood pressure returned to baseline and stable Postop Assessment: no apparent nausea or vomiting Anesthetic complications: no   No complications documented.  Last Vitals:  Vitals:   01/04/20 1227 01/04/20 1230  BP: (!) 103/56   Pulse: 88 88  Resp: 20   Temp:    SpO2: 100% 100%    Last Pain:  Vitals:   01/04/20 1245  TempSrc:   PainSc: [place]

## 2020-01-04 NOTE — Transfer of Care (Signed)
Immediate Anesthesia Transfer of Care Note  Patient: Toni Myers  Procedure(s) Performed: APPENDECTOMY LAPAROSCOPIC (N/A Abdomen)  Patient Location: PACU  Anesthesia Type:General  Level of Consciousness: awake, patient cooperative and responds to stimulation  Airway & Oxygen Therapy: Patient Spontanous Breathing and Patient connected to face mask oxygen  Post-op Assessment: Report given to RN and Post -op Vital signs reviewed and stable  Post vital signs: Reviewed and stable  Last Vitals:  Vitals Value Taken Time  BP    Temp    Pulse    Resp    SpO2      Last Pain:  Vitals:   01/04/20 1245  TempSrc:   PainSc: 6          Complications: No complications documented.

## 2020-01-04 NOTE — H&P (Signed)
Toni Myers  10/05/1950 [id number]  CARE TEAM:  PCP: Cyndi Bender, PA-C  Outpatient Care Team: Patient Care Team: Cyndi Bender, Hershal Coria as PCP - General (Physician Assistant) Nahser, Wonda Cheng, MD as PCP - Cardiology (Cardiology) Hennie Duos, MD as Consulting Physician (Rheumatology) Glenna Fellows, MD as Attending Physician (Neurosurgery)  Inpatient Treatment Team: Treatment Team: Attending Provider: Dorie Rank, MD; Physician Assistant: Rayna Sexton, PA-C; Technician: Blair Promise, NT; Technician: Aguirre-Carachure, Allie Dimmer, [place]; Registered Nurse: Garrison, [place] N, RN; Consulting Physician: Edison Pace, Md, MD   This patient is a 69 y.o.female who presents today for surgical evaluation at the request of Rayna Sexton, PA-C.   Chief complaint / Reason for evaluation: Appendicitis  Paged at 1:04pm today by [facility] ED. 69 year old human with 2-day history of crampy abdominal pain.  Normally has some constipation, moving her bowels every other day.  History of colonoscopies in the past.  She cannot remember exactly who did them but recalls them being normal underwhelming.  Patient noticed she had worsening abdominal pain lower area became more intense.  Discussed with her primary care physician.  They recommend she go to emergency room.  She had it there yesterday evening around 5:00.  Eventually able to come in and be evaluated with exam and CT scan finished noontime today concerning for appendicitis.  Surgical consultation requested.  Patient has a history tubal ligation but no other abdominal surgeries.  She normally can walk a couple miles without difficulty.  She does have some rheumatoid arthritis issues.  She had right knee surgery done by Dr. Lucienne Capers and is still recovering from that.  That was 2 months ago.  Back to most day-to-day activities.  No personal nor family history of GI/colon cancer, inflammatory bowel disease, irritable bowel syndrome, allergy such as Celiac  Sprue, dietary/dairy problems, colitis, ulcers nor gastritis.  No recent sick contacts/gastroenteritis.  No travel outside the country.  No changes in diet.  No dysphagia to solids or liquids.  No significant heartburn or reflux.  No hematochezia, hematemesis, coffee ground emesis.  No evidence of prior gastric/peptic ulceration.    Assessment  Toni Myers  69 y.o. female       Problem List:  Principal Problem:   Acute appendicitis Active Problems:   Essential hypertension   RLS (restless legs syndrome)   Sjogren's disease (HCC)   Rheumatoid arthritis (HCC)   CKD (chronic kidney disease), stage II   COPD (chronic obstructive pulmonary disease) ([place])   Probable appendicitis  Plan:  Admission  IV antibiotics.  Nausea and pain control.  IV fluid rehydration.  Blood pressure control.  Diagnostic laparoscopy with appendectomy this admission  The anatomy & physiology of the digestive tract was discussed.  The pathophysiology of appendicitis and other appendiceal disorders were discussed.  Natural history risks without surgery was discussed.   I feel the risks of no intervention will lead to serious problems that outweigh the operative risks; therefore, I recommended diagnostic laparoscopy with removal of appendix to remove the pathology.  Laparoscopic & open techniques were discussed.   I noted a good likelihood this will help address the problem.   Risks such as bleeding, infection, abscess, leak, reoperation, injury to other organs, need for repair of tissues / organs, possible ostomy, hernia, heart attack, stroke, death, and other risks were discussed.  Goals of post-operative recovery were discussed as well.  We will work to minimize complications.  Questions were answered.  The patient expresses understanding &  wishes to proceed with surgery.   -VTE prophylaxis- SCDs, etc -mobilize as tolerated to help recovery  40 minutes spent in review, evaluation, examination,  counseling, and coordination of care.  More than 50% of that time was spent in counseling.  Adin Hector, MD, FACS, MASCRS Gastrointestinal and Minimally Invasive Surgery  [facility] [address] [place],  [address] [phone] Fax [phone] Main/Paging  CONTACT INFORMATION: Weekday (9AM-5PM) concerns: Call [facility] main office at [phone] Weeknight (5PM-9AM) or Weekend/Holiday concerns: Check [email or web address] for General Surgery [facility] coverage (Please, do not use SecureChat as it is not reliable communication to operating surgeons for immediate patient care)      01/04/2020      Past Medical History:  Diagnosis Date  . Anxiety   . Arthritis    Rheumatoid  . Asthma   . Chronic iritis, left eye   . Chronic low back pain   . Closed fracture of multiple pubic rami ([place]) 07/19/2013  . Closed fracture of pelvic rim 07/19/2013  . Cough   . Depression   . Dyspnea   . Dysrhythmia    resolved  . GERD (gastroesophageal reflux disease)   . Hypertension   . Migraine    none in several years  . Palpitations   . RLS (restless legs syndrome)   . Shingles   . Sjogrens syndrome [facility])     Past Surgical History:  Procedure Laterality Date  . BACK SURGERY     07/1998 and 03/1999, herniated disc, 2 rod and 4 screws  . COLONOSCOPY WITH PROPOFOL N/A 02/09/2015   Procedure: COLONOSCOPY WITH PROPOFOL;  Surgeon: Garlan Fair, MD;  Location: [facility] ENDOSCOPY;  Service: Endoscopy;  Laterality: N/A;  . ESOPHAGOGASTRODUODENOSCOPY (EGD) WITH PROPOFOL N/A 02/09/2015   Procedure: ESOPHAGOGASTRODUODENOSCOPY (EGD) WITH PROPOFOL;  Surgeon: Garlan Fair, MD;  Location: [facility] ENDOSCOPY;  Service: Endoscopy;  Laterality: N/A;  . EYE SURGERY Bilateral 2020  . TOTAL HIP ARTHROPLASTY Right 11/13/2019   Procedure: TOTAL HIP ARTHROPLASTY ANTERIOR APPROACH;  Surgeon: Gaynelle Arabian, MD;  Location: [facility] ORS;  Service: Orthopedics;  Laterality: Right;  14min  . TUBAL  LIGATION      Social History   Socioeconomic History  . Marital status: Divorced    Spouse name: Not on file  . Number of children: Not on file  . Years of education: Not on file  . Highest education level: Not on file  Occupational History  . Occupation: retired  Tobacco Use  . Smoking status: Never Smoker  . Smokeless tobacco: Never Used  Vaping Use  . Vaping Use: Never used  Substance and Sexual Activity  . Alcohol use: Yes    Comment: 2 glasses monthly  . Drug use: No  . Sexual activity: Not on file  Other Topics Concern  . Not on file  Social History Narrative  . Not on file   Social Determinants of Health   Financial Resource Strain:   . Difficulty of Paying Living Expenses:   Food Insecurity:   . Worried About Charity fundraiser in the Last Year:   . Arboriculturist in the Last Year:   Transportation Needs:   . Film/video editor (Medical):   Marland Kitchen Lack of Transportation (Non-Medical):   Physical Activity:   . Days of Exercise per Week:   . Minutes of Exercise per Session:   Stress:   . Feeling of Stress :   Social Connections:   . Frequency  of Communication with Friends and Family:   . Frequency of Social Gatherings with Friends and Family:   . Attends Religious Services:   . Active Member of Clubs or Organizations:   . Attends Archivist Meetings:   Marland Kitchen Marital Status:   Intimate Partner Violence:   . Fear of Current or Ex-Partner:   . Emotionally Abused:   Marland Kitchen Physically Abused:   . Sexually Abused:     Family History  Problem Relation Age of Onset  . Arthritis Mother     Current Facility-Administered Medications  Medication Dose Route Frequency Provider Last Rate Last Admin  . cefTRIAXone (ROCEPHIN) 2 g in sodium chloride 0.9 % 100 mL IVPB  2 g Intravenous Once Rayna Sexton, PA-C 200 mL/hr at 01/04/20 1303 2 g at 01/04/20 1303   And  . metroNIDAZOLE (FLAGYL) IVPB 500 mg  500 mg Intravenous Once Rayna Sexton, PA-C      .  sodium chloride (PF) 0.9 % injection            Current Outpatient Medications  Medication Sig Dispense Refill  . albuterol (PROAIR HFA) 108 (90 BASE) MCG/ACT inhaler Inhale 2 puffs into the lungs every 6 (six) hours as needed for wheezing or shortness of breath.     Marland Kitchen albuterol (PROVENTIL) (2.5 MG/3ML) 0.083% nebulizer solution Take 2.5 mg by nebulization every 4 (four) hours as needed for wheezing or shortness of breath. Reported on 10/22/2015    . budesonide-formoterol (SYMBICORT) 160-4.5 MCG/ACT inhaler Inhale 2 puffs into the lungs 2 (two) times daily. 2 Inhaler 0  . cyclobenzaprine (FLEXERIL) 10 MG tablet Take 1 tablet (10 mg total) by mouth at bedtime as needed. For spasms 20 tablet 0  . cyclobenzaprine (FLEXERIL) 10 MG tablet Take 10 mg by mouth 3 (three) times daily as needed for muscle spasms.    Marland Kitchen escitalopram (LEXAPRO) 10 MG tablet Take 10 mg by mouth daily.    . folic acid (FOLVITE) 1 MG tablet Take 2 mg by mouth daily.    Marland Kitchen gabapentin (NEURONTIN) 300 MG capsule Take 300-600 mg by mouth See admin instructions. Take 1 capsule (300 mg) by mouth twice daily & take 2 capsules (600 mg) by mouth at bedtime.    . hydrOXYzine (VISTARIL) 25 MG capsule Take 25 mg by mouth 3 (three) times daily as needed for anxiety.    Marland Kitchen loratadine (CLARITIN) 10 MG tablet Take 10 mg by mouth daily.    . methocarbamol (ROBAXIN) 500 MG tablet Take 1 tablet (500 mg total) by mouth every 6 (six) hours as needed for muscle spasms. 40 tablet 0  . methotrexate 250 MG/10ML injection Inject 15 mg into the vein every Wednesday. Injects 0.6 ml subcutaneously every Wed (in stomach).    . metoprolol succinate (TOPROL-XL) 25 MG 24 hr tablet Take 1 tablet (25 mg total) by mouth daily. 90 tablet 3  . oxyCODONE (OXY IR/ROXICODONE) 5 MG immediate release tablet Take 1-2 tablets (5-10 mg total) by mouth every 6 (six) hours as needed for moderate pain or severe pain. 56 tablet 0  . zoledronic acid (RECLAST) 5 MG/100ML SOLN injection  Inject into the vein See admin instructions. Yearly       Allergies  Allergen Reactions  . Morphine And Related Nausea And Vomiting    "makes me sick at high doses"  . Prednisone     Causes insomnia by the 3rd day of taking.prednisone taper.    ROS:   All other systems reviewed & are  negative except per HPI or as noted below: Constitutional:  No fevers, chills, sweats.  Weight stable Eyes:  No vision changes, No discharge HENT:  No sore throats, nasal drainage Lymph: No neck swelling, No bruising easily Pulmonary:  No cough, productive sputum CV: No orthopnea, PND  Patient walks 60 minutes for about 2 miles without difficulty.  No exertional chest/neck/shoulder/arm pain. GI:  No personal nor family history of GI/colon cancer, inflammatory bowel disease, irritable bowel syndrome, allergy such as Celiac Sprue, dietary/dairy problems, colitis, ulcers nor gastritis.  No recent sick contacts/gastroenteritis.  No travel outside the country.  No changes in diet. Renal: No UTIs, No hematuria Genital:  No drainage, bleeding, masses Musculoskeletal: No severe joint pain.  Good ROM major joints Skin:  No sores or lesions.  No rashes Heme/Lymph:  No easy bleeding.  No swollen lymph nodes Neuro: No focal weakness/numbness.  No seizures Psych: No suicidal ideation.  No hallucinations  BP (!) 103/56 (BP Location: Left Arm)   Pulse 88   Temp (!) 97.5 F (36.4 C) (Oral)   Resp 20   Ht 5\' 4"  (1.626 m)   Wt 59.4 kg   SpO2 100%   BMI 22.49 kg/m   Physical Exam: Constitutional: Not cachectic.  Hygeine adequate.  Vitals signs as above.   Eyes: Pupils reactive, normal extraocular movements. Sclera nonicteric Neuro: CN II-XII intact.  No major focal sensory defects.  No major motor deficits. Lymph: No head/neck/groin lymphadenopathy Psych:  No severe agitation.  No severe anxiety.  Judgment & insight Adequate, Oriented x4, HENT: Normocephalic, Mucus membranes moist.  No thrush.   Neck: Supple,  No tracheal deviation.  No obvious thyromegaly Chest: No pain to chest wall compression.  Good respiratory excursion.  No audible wheezing CV:  Pulses intact.  Regular rhythm.  No major extremity edema Abdomen:  Soft.  Mildly distended.  Tenderness at Right lower quadrant with some voluntary guarding.  Reproduction of pain with cough and bed shake although milder.  Left lower quadrant upper abdomen nontender.  No umbilical hernias.. No incarcerated hernias.  No hepatomegaly.  No splenomegaly Gen:  No inguinal hernias.  No inguinal lymphadenopathy.   Ext: No obvious deformity or contracture no significant edema.  No cyanosis Skin: No major subcutaneous nodules.  Warm and dry Musculoskeletal: Severe joint rigidity not present.  Mild stiffness at right knee and sensitivity but no cellulitis.  No obvious clubbing.  No digital petechiae.     Results:   Labs: Results for orders placed or performed during the hospital encounter of 01/04/20 (from the past 48 hour(s))  Lipase, blood     Status: None   Collection Time: 01/03/20  6:01 PM  Result Value Ref Range   Lipase 24 11 - 51 U/L    Comment: Performed at [facility], [place] [address], [place], [place] [address]  Comprehensive metabolic panel     Status: Abnormal   Collection Time: 01/03/20  6:01 PM  Result Value Ref Range   Sodium 136 135 - 145 mmol/L   Potassium 3.7 3.5 - 5.1 mmol/L   Chloride 101 98 - 111 mmol/L   CO2 25 22 - 32 mmol/L   Glucose, Bld 101 (H) 70 - 99 mg/dL    Comment: Glucose reference range applies only to samples taken after fasting for at least 8 hours.   BUN 14 8 - 23 mg/dL   Creatinine, Ser 0.75 0.44 - 1.00 mg/dL   Calcium 9.3 8.9 - 10.3 mg/dL  Total Protein 8.2 (H) 6.5 - 8.1 g/dL   Albumin 4.2 3.5 - 5.0 g/dL   AST 18 15 - 41 U/L   ALT 12 0 - 44 U/L   Alkaline Phosphatase 81 38 - 126 U/L   Total Bilirubin 1.3 (H) 0.3 - 1.2 mg/dL   GFR calc non Af Amer >60 >60 mL/min   GFR calc Af Amer >60 >60  mL/min   Anion gap 10 5 - 15    Comment: Performed at [facility], [place] [address] [place], [place] [address]  CBC     Status: Abnormal   Collection Time: 01/03/20  6:01 PM  Result Value Ref Range   WBC 6.6 4.0 - 10.5 K/uL   RBC 3.93 3.87 - 5.11 MIL/uL   Hemoglobin 11.2 (L) 12.0 - 15.0 g/dL   HCT 35.5 (L) 36 - 46 %   MCV 90.3 80.0 - 100.0 fL   MCH 28.5 26.0 - 34.0 pg   MCHC 31.5 30.0 - 36.0 g/dL   RDW 14.0 11.5 - 15.5 %   Platelets 360 150 - 400 K/uL   nRBC 0.0 0.0 - 0.2 %    Comment: Performed at [facility], [place] [address] [place], [place] [address]  Urinalysis, Routine w reflex microscopic Urine, Clean Catch     Status: Abnormal   Collection Time: 01/04/20 12:50 PM  Result Value Ref Range   Color, Urine YELLOW YELLOW   APPearance CLEAR CLEAR   Specific Gravity, Urine 1.029 1.005 - 1.030   pH 5.0 5.0 - 8.0   Glucose, UA NEGATIVE NEGATIVE mg/dL   Hgb urine dipstick NEGATIVE NEGATIVE   Bilirubin Urine NEGATIVE NEGATIVE   Ketones, ur 5 (A) NEGATIVE mg/dL   Protein, ur NEGATIVE NEGATIVE mg/dL   Nitrite NEGATIVE NEGATIVE   Leukocytes,Ua NEGATIVE NEGATIVE    Comment: Performed at [facility], [place] [address] [place], [place] [address]    Imaging / Studies: CT ABDOMEN PELVIS W CONTRAST  Result Date: 01/04/2020 CLINICAL DATA:  Right lower quadrant pain and fever 2 days. Suspect appendicitis. EXAM: CT ABDOMEN AND PELVIS WITH CONTRAST TECHNIQUE: Multidetector CT imaging of the abdomen and pelvis was performed using the standard protocol following bolus administration of intravenous contrast. CONTRAST:  165mL OMNIPAQUE IOHEXOL 300 MG/ML  SOLN COMPARISON:  Chest CT 08/03/2018 FINDINGS: Lower chest: Bibasilar scarring unchanged. Hepatobiliary: Liver, gallbladder and biliary tree are normal. Pancreas: Normal. Spleen: Normal. Adrenals/Urinary Tract: Adrenal glands are normal. Kidneys are normal in size without hydronephrosis or  nephrolithiasis. Ureters and bladder are normal. Stomach/Bowel: Stomach and small bowel are normal. Somewhat redundant hepatic flexure. There is mild fecal retention throughout the colon. There is mild colonic diverticulosis. There is a short appendix which is mildly enlarged measuring 1 cm with mild adjacent inflammatory change of the periappendiceal fat. Tiny amount of fluid in the adjacent pericolic gutter. Findings likely due to mild acute appendicitis. There is no evidence of perforation or abscess. Vascular/Lymphatic: Minimal calcified plaque over the abdominal aorta which is normal in caliber. No evidence of adenopathy. Reproductive: Normal. Other: None. Musculoskeletal: Degenerative changes spine with severe biphasic curvature of the thoracolumbar spine with curvature of the lumbar spine convex left. Posterior fusion hardware with interbody fusion at the L5-S1 level. Right hip arthroplasty. Mild degenerate change of the left hip. Old right pelvic fractures involving the pubic rami and symphysis. IMPRESSION: 1. Findings compatible with mild acute appendicitis. No evidence of perforation or abscess. 2. Mild colonic diverticulosis. 3.  Aortic atherosclerosis. Aortic Atherosclerosis (ICD10-I70.0). These results were called by telephone at the time of interpretation on 01/04/2020 at 12:43 pm to provider [facility] , who verbally acknowledged these results. Electronically Signed   By: Marin Olp M.D.   On: 01/04/2020 12:43    Medications / Allergies: per chart  Antibiotics: Anti-infectives (From admission, onward)   Start     Dose/Rate Route Frequency Ordered Stop   01/04/20 1300  cefTRIAXone (ROCEPHIN) 2 g in sodium chloride 0.9 % 100 mL IVPB     Discontinue    "And" Linked Group Details   2 g 200 mL/hr over 30 Minutes Intravenous  Once 01/04/20 1252     01/04/20 1300  metroNIDAZOLE (FLAGYL) IVPB 500 mg     Discontinue    "And" Linked Group Details   500 mg 100 mL/hr over 60 Minutes  Intravenous  Once 01/04/20 1252          Note: Portions of this report may have been transcribed using voice recognition software. Every effort was made to ensure accuracy; however, inadvertent computerized transcription errors may be present.   Any transcriptional errors that result from this process are unintentional.    Adin Hector, MD, FACS, MASCRS Gastrointestinal and Minimally Invasive Surgery  [facility] [address] [place], [place] [address] [phone] Fax [phone] Main/Paging  CONTACT INFORMATION: Weekday (9AM-5PM) concerns: Call [facility] main office at [phone] Weeknight (5PM-9AM) or Weekend/Holiday concerns: Check [email or web address] for General Surgery [facility] coverage (Please, do not use SecureChat as it is not reliable communication to operating surgeons for immediate patient care)      01/04/2020  1:25 PM

## 2020-01-04 NOTE — Anesthesia Preprocedure Evaluation (Addendum)
Anesthesia Evaluation  Patient identified by MRN, date of birth, ID band Patient awake    Reviewed: Allergy & Precautions, NPO status , Patient's Chart, lab work & pertinent test results, reviewed documented beta blocker date and time   Airway Mallampati: I       Dental  (+) Teeth Intact, Dental Advisory Given   Pulmonary asthma , COPD,  COPD inhaler,  Last needed inhalers a week ago, noncompliant with inhalers   Pulmonary exam normal breath sounds clear to auscultation       Cardiovascular hypertension, Pt. on home beta blockers +CHF (grade 1 diastolic dysfunction)   Rhythm:Regular Rate:Normal  Last echo 2017: Left ventricle: The cavity size was normal. Wall thickness was  normal. Systolic function was normal. The estimated ejection  fraction was in the range of 60% to 65%. Wall motion was normal;  there were no regional wall motion abnormalities. Doppler  parameters are consistent with abnormal left ventricular  relaxation (grade 1 diastolic dysfunction).    Neuro/Psych  Headaches, PSYCHIATRIC DISORDERS Anxiety Depression Dementia    GI/Hepatic Neg liver ROS, GERD  Medicated and Controlled,  Endo/Other  negative endocrine ROS  Renal/GU negative Renal ROS  negative genitourinary   Musculoskeletal  (+) Arthritis , Osteoarthritis and Rheumatoid disorders,  Chronic LBP   Abdominal   Peds  Hematology  (+) Blood dyscrasia, anemia , hct 35.5   Anesthesia Other Findings   Reproductive/Obstetrics negative OB ROS                            Anesthesia Physical Anesthesia Plan  ASA: III  Anesthesia Plan: General   Post-op Pain Management:    Induction: Intravenous  PONV Risk Score and Plan: 3 and Ondansetron, Dexamethasone and Treatment may vary due to age or medical condition  Airway Management Planned: Oral ETT  Additional Equipment: None  Intra-op Plan:   Post-operative  Plan: Extubation in OR  Informed Consent: I have reviewed the patients History and Physical, chart, labs and discussed the procedure including the risks, benefits and alternatives for the proposed anesthesia with the patient or authorized representative who has indicated his/her understanding and acceptance.     Dental advisory given  Plan Discussed with: CRNA  Anesthesia Plan Comments:        Anesthesia Quick Evaluation

## 2020-01-04 NOTE — Addendum Note (Signed)
Addendum  created 01/04/20 1658 by Pervis Hocking, DO   Order list changed

## 2020-01-04 NOTE — Op Note (Signed)
PATIENT:  Toni Myers  69 y.o. female  Patient Care Team: Cyndi Bender, PA-C as PCP - General (Physician Assistant) Nahser, Wonda Cheng, MD as PCP - Cardiology (Cardiology) Hennie Duos, MD as Consulting Physician (Rheumatology) Glenna Fellows, MD as Attending Physician (Neurosurgery)  PRE-OPERATIVE DIAGNOSIS:  Acute appendicitis  POST-OPERATIVE DIAGNOSIS:  Acute phlegmonous appendicitis   PROCEDURE:   APPENDECTOMY LAPAROSCOPIC TAP BLOCK - BILATERAL  SURGEON:  Adin Hector, MD  ANESTHESIA:   local and general  Nerve block provided with liposomal bupivacaine (Experel) mixed with 0.25% bupivacaine as a Bilateral TAP block x 36mL each side at the level of the transverse abdominis & preperitoneal spaces along the flank at the anterior axillary line, from subcostal ridge to iliac crest under laparoscopic guidance    EBL:  Total I/O In: 700 [IV Piggyback:700] Out: 530 [Urine:500; Blood:30]  Delay start of Pharmacological VTE agent (>24hrs) due to surgical blood loss or risk of bleeding:  no  DRAINS: none   SPECIMEN:  APPENDIX  DISPOSITION OF SPECIMEN:  PATHOLOGY  COUNTS:  YES  PLAN OF CARE: Admit for overnight observation  PATIENT DISPOSITION:  PACU - hemodynamically stable.   INDICATIONS: Patient with concerning symptoms & work up suspicious for appendicitis.  Surgery was recommended:  The anatomy & physiology of the digestive tract was discussed.  The pathophysiology of appendicitis was discussed.  Natural history risks without surgery was discussed.   I feel the risks of no intervention will lead to serious problems that outweigh the operative risks; therefore, I recommended diagnostic laparoscopy with removal of appendix to remove the pathology.  Laparoscopic & open techniques were discussed.   I noted a good likelihood this will help address the problem.    Risks such as bleeding, infection, abscess, leak, reoperation, possible ostomy, hernia, heart attack, death,  and other risks were discussed.  Goals of post-operative recovery were discussed as well.  We will work to minimize complications.  Questions were answered.  The patient expresses understanding & wishes to proceed with surgery.  OR FINDINGS: Inflamed appendix with early phlegmon.  No frank perforation or gangrene.  CASE DATA:  Type of patient?: LDOW CASE (Surgical Hospitalist [facility] Inpatient)  Status of Case? EMERGENT Add On  Infection Present At Time Of Surgery (PATOS)?  PHLEGMON  DESCRIPTION:   The patient was identified & brought into the operating room. The patient was positioned supine with arms tucked. SCDs were active during the entire case. The patient underwent general anesthesia without any difficulty.  The abdomen was prepped and draped in a sterile fashion. A Surgical Timeout confirmed our plan.   I made a transverse incision through the superior umbilical fold.  I made a small transverse nick through the infraumbilical fascia and confirmed peritoneal entry.  I placed a 14mm port.  We induced carbon dioxide insufflation.  Camera inspection revealed no injury.  I placed additional ports under direct laparoscopic visualization.  Could see a thickened inflamed ileocecal region with phlegmon suspicious for appendicitis.  I mobilized the terminal ileum to proximal ascending colon in a lateral to medial fashion.  I took care to avoid injuring any retroperitoneal structures.  I freed the appendix off its attachments to the ascending colon and cecal mesentery.  I elevated the appendix. I skeletonized the mesoappendix. I was able to free off the base of the appendix which was still viable.  I transected through the mesoappendix and skeletonize around the ileocecal region and the appendiceal base.  Thickened at the appendiceal  base.  I stapled the appendix off the cecum using a laparoscopic stapler. I took a healthy cuff of viable cecum.  I placed the appendix inside an EcoSac bag and removed out the  21mm stapler port.  I did copious irrigation. Hemostasis was good in the mesoappendix, colon mesentery, and retroperitoneum. Staple line was intact on the cecum with no bleeding. I washed out the pelvis, retrohepatic space and right paracolic gutter. I washed out the left side as well.  Hemostasis is good. There was no perforation or injury. Because the area cleaned up well after irrigation, I did not place a drain.  I closed the 12 mm stapler port site with a 0 Vicryl stitch using a suture passer under direct laparoscopic visualization.   I aspirated the carbon dioxide. I removed the ports.  I closed skin using 4-0 monocryl stitch.  Sterile dressings applied.  Patient was extubated and sent to the recovery room.  I discussed the operative findings with the patient's family. I suspect the patient is going used in the hospital at least overnight and will need antibiotics overnight. I discussed operative findings, updated the patient's status, discussed probable steps to recovery, and gave postoperative recommendations to the patient's significant other, Bill Flowe.  Recommendations were made.  Questions were answered.  He expressed understanding & appreciation.  Adin Hector, M.D., F.A.C.S. Gastrointestinal and Minimally Invasive Surgery [facility] [address], [place], [place] [address] [phone] Main / Paging  01/04/2020 4:09 PM

## 2020-01-04 NOTE — Discharge Instructions (Signed)
SURGERY: POST OP INSTRUCTIONS (Surgery for small bowel obstruction, colon resection, etc)   ######################################################################  EAT Gradually transition to a high fiber diet with a fiber supplement over the next few days after discharge  WALK Walk an hour a day.  Control your pain to do that.    CONTROL PAIN Control pain so that you can walk, sleep, tolerate sneezing/coughing, go up/down stairs.  HAVE A BOWEL MOVEMENT DAILY Keep your bowels regular to avoid problems.  OK to try a laxative to override constipation.  OK to use an antidairrheal to slow down diarrhea.  Call if not better after 2 tries  CALL IF YOU HAVE PROBLEMS/CONCERNS Call if you are still struggling despite following these instructions. Call if you have concerns not answered by these instructions  ######################################################################   DIET Follow a light diet the first few days at home.  Start with a bland diet such as soups, liquids, starchy foods, low fat foods, etc.  If you feel full, bloated, or constipated, stay on a ful liquid or pureed/blenderized diet for a few days until you feel better and no longer constipated. Be sure to drink plenty of fluids every day to avoid getting dehydrated (feeling dizzy, not urinating, etc.). Gradually add a fiber supplement to your diet over the next week.  Gradually get back to a regular solid diet.  Avoid fast food or heavy meals the first week as you are more likely to get nauseated. It is expected for your digestive tract to need a few months to get back to normal.  It is common for your bowel movements and stools to be irregular.  You will have occasional bloating and cramping that should eventually fade away.  Until you are eating solid food normally, off all pain medications, and back to regular activities; your bowels will not be normal. Focus on eating a low-fat, high fiber diet the rest of your life  (See Getting to Good Bowel Health, below).  CARE of your INCISION or WOUND It is good for closed incision and even open wounds to be washed every day.  Shower every day.  Short baths are fine.  Wash the incisions and wounds clean with soap & water.    If you have a closed incision(s), wash the incision with soap & water every day.  You may leave closed incisions open to air if it is dry.   You may cover the incision with clean gauze & replace it after your daily shower for comfort. If you have skin tapes (Steristrips) or skin glue (Dermabond) on your incision, leave them in place.  They will fall off on their own like a scab.  You may trim any edges that curl up with clean scissors.  If you have staples, set up an appointment for them to be removed in the office in 10 days after surgery.  If you have a drain, wash around the skin exit site with soap & water and place a new dressing of gauze or band aid around the skin every day.  Keep the drain site clean & dry.    If you have an open wound with packing, see wound care instructions.  In general, it is encouraged that you remove your dressing and packing, shower with soap & water, and replace your dressing once a day.  Pack the wound with clean gauze moistened with normal (0.9%) saline to keep the wound moist & uninfected.  Pressure on the dressing for 30 minutes will stop most wound   bleeding.  Eventually your body will heal & pull the open wound closed over the next few months.  Raw open wounds will occasionally bleed or secrete yellow drainage until it heals closed.  Drain sites will drain a little until the drain is removed.  Even closed incisions can have mild bleeding or drainage the first few days until the skin edges scab over & seal.   If you have an open wound with a wound vac, see wound vac care instructions.     ACTIVITIES as tolerated Start light daily activities --- self-care, walking, climbing stairs-- beginning the day after surgery.   Gradually increase activities as tolerated.  Control your pain to be active.  Stop when you are tired.  Ideally, walk several times a day, eventually an hour a day.   Most people are back to most day-to-day activities in a few weeks.  It takes 4-8 weeks to get back to unrestricted, intense activity. If you can walk 30 minutes without difficulty, it is safe to try more intense activity such as jogging, treadmill, bicycling, low-impact aerobics, swimming, etc. Save the most intensive and strenuous activity for last (Usually 4-8 weeks after surgery) such as sit-ups, heavy lifting, contact sports, etc.  Refrain from any intense heavy lifting or straining until you are off narcotics for pain control.  You will have off days, but things should improve week-by-week. DO NOT PUSH THROUGH PAIN.  Let pain be your guide: If it hurts to do something, don't do it.  Pain is your body warning you to avoid that activity for another week until the pain goes down. You may drive when you are no longer taking narcotic prescription pain medication, you can comfortably wear a seatbelt, and you can safely make sudden turns/stops to protect yourself without hesitating due to pain. You may have sexual intercourse when it is comfortable. If it hurts to do something, stop.  MEDICATIONS Take your usually prescribed home medications unless otherwise directed.   Blood thinners:  Usually you can restart any strong blood thinners after the second postoperative day.  It is OK to take aspirin right away.     If you are on strong blood thinners (warfarin/Coumadin, Plavix, Xerelto, Eliquis, Pradaxa, etc), discuss with your surgeon, medicine PCP, and/or cardiologist for instructions on when to restart the blood thinner & if blood monitoring is needed (PT/INR blood check, etc).     PAIN CONTROL Pain after surgery or related to activity is often due to strain/injury to muscle, tendon, nerves and/or incisions.  This pain is usually  short-term and will improve in a few months.  To help speed the process of healing and to get back to regular activity more quickly, DO THE FOLLOWING THINGS TOGETHER: 1. Increase activity gradually.  DO NOT PUSH THROUGH PAIN 2. Use Ice and/or Heat 3. Try Gentle Massage and/or Stretching 4. Take over the counter pain medication 5. Take Narcotic prescription pain medication for more severe pain  Good pain control = faster recovery.  It is better to take more medicine to be more active than to stay in bed all day to avoid medications. 1.  Increase activity gradually Avoid heavy lifting at first, then increase to lifting as tolerated over the next 6 weeks. Do not "push through" the pain.  Listen to your body and avoid positions and maneuvers than reproduce the pain.  Wait a few days before trying something more intense Walking an hour a day is encouraged to help your body recover faster   and more safely.  Start slowly and stop when getting sore.  If you can walk 30 minutes without stopping or pain, you can try more intense activity (running, jogging, aerobics, cycling, swimming, treadmill, sex, sports, weightlifting, etc.) Remember: If it hurts to do it, then don't do it! 2. Use Ice and/or Heat You will have swelling and bruising around the incisions.  This will take several weeks to resolve. Ice packs or heating pads (6-8 times a day, 30-60 minutes at a time) will help sooth soreness & bruising. Some people prefer to use ice alone, heat alone, or alternate between ice & heat.  Experiment and see what works best for you.  Consider trying ice for the first few days to help decrease swelling and bruising; then, switch to heat to help relax sore spots and speed recovery. Shower every day.  Short baths are fine.  It feels good!  Keep the incisions and wounds clean with soap & water.   3. Try Gentle Massage and/or Stretching Massage at the area of pain many times a day Stop if you feel pain - do not  overdo it 4. Take over the counter pain medication This helps the muscle and nerve tissues become less irritable and calm down faster Choose ONE of the following over-the-counter anti-inflammatory medications: Acetaminophen 500mg tabs (Tylenol) 1-2 pills with every meal and just before bedtime (avoid if you have liver problems or if you have acetaminophen in you narcotic prescription) Naproxen 220mg tabs (ex. Aleve, Naprosyn) 1-2 pills twice a day (avoid if you have kidney, stomach, IBD, or bleeding problems) Ibuprofen 200mg tabs (ex. Advil, Motrin) 3-4 pills with every meal and just before bedtime (avoid if you have kidney, stomach, IBD, or bleeding problems) Take with food/snack several times a day as directed for at least 2 weeks to help keep pain / soreness down & more manageable. 5. Take Narcotic prescription pain medication for more severe pain A prescription for strong pain control is often given to you upon discharge (for example: oxycodone/Percocet, hydrocodone/Norco/Vicodin, or tramadol/Ultram) Take your pain medication as prescribed. Be mindful that most narcotic prescriptions contain Tylenol (acetaminophen) as well - avoid taking too much Tylenol. If you are having problems/concerns with the prescription medicine (does not control pain, nausea, vomiting, rash, itching, etc.), please call us [phone] to see if we need to switch you to a different pain medicine that will work better for you and/or control your side effects better. If you need a refill on your pain medication, you must call the office before 4 pm and on weekdays only.  By federal law, prescriptions for narcotics cannot be called into a pharmacy.  They must be filled out on paper & picked up from our office by the patient or authorized caretaker.  Prescriptions cannot be filled after 4 pm nor on weekends.    WHEN TO CALL US [phone] Severe uncontrolled or worsening pain  Fever over 101 F (38.5 C) Concerns with  the incision: Worsening pain, redness, rash/hives, swelling, bleeding, or drainage Reactions / problems with new medications (itching, rash, hives, nausea, etc.) Nausea and/or vomiting Difficulty urinating Difficulty breathing Worsening fatigue, dizziness, lightheadedness, blurred vision Other concerns If you are not getting better after two weeks or are noticing you are getting worse, contact our office [phone] for further advice.  We may need to adjust your medications, re-evaluate you in the office, send you to the emergency room, or see what other things we can do to help. The   clinic staff is available to answer your questions during regular business hours (8:30am-5pm).  Please don't hesitate to call and ask to speak to one of our nurses for clinical concerns.    A surgeon from [facility] is always on call at the hospitals 24 hours/day If you have a medical emergency, go to the nearest emergency room or call 911.  FOLLOW UP in our office One the day of your discharge from the hospital (or the next business weekday), please call [facility] to set up or confirm an appointment to see your surgeon in the office for a follow-up appointment.  Usually it is 2-3 weeks after your surgery.   If you have skin staples at your incision(s), let the office know so we can set up a time in the office for the nurse to remove them (usually around 10 days after surgery). Make sure that you call for appointments the day of discharge (or the next business weekday) from the hospital to ensure a convenient appointment time. IF YOU HAVE DISABILITY OR FAMILY LEAVE FORMS, BRING THEM TO THE OFFICE FOR PROCESSING.  DO NOT GIVE THEM TO YOUR DOCTOR.  [facility] [address], [address], , [place]  [address] ? [phone] - Main [phone] - Toll Free,  [phone] - Fax [email or web address]  GETTING TO GOOD BOWEL HEALTH. It is  expected for your digestive tract to need a few months to get back to normal.  It is common for your bowel movements and stools to be irregular.  You will have occasional bloating and cramping that should eventually fade away.  Until you are eating solid food normally, off all pain medications, and back to regular activities; your bowels will not be normal.   Avoiding constipation The goal: ONE SOFT BOWEL MOVEMENT A DAY!    Drink plenty of fluids.  Choose water first. TAKE A FIBER SUPPLEMENT EVERY DAY THE REST OF YOUR LIFE During your first week back home, gradually add back a fiber supplement every day Experiment which form you can tolerate.   There are many forms such as powders, tablets, wafers, gummies, etc Psyllium bran (Metamucil), methylcellulose (Citrucel), Miralax or Glycolax, Benefiber, Flax Seed.  Adjust the dose week-by-week (1/2 dose/day to 6 doses a day) until you are moving your bowels 1-2 times a day.  Cut back the dose or try a different fiber product if it is giving you problems such as diarrhea or bloating. Sometimes a laxative is needed to help jump-start bowels if constipated until the fiber supplement can help regulate your bowels.  If you are tolerating eating & you are farting, it is okay to try a gentle laxative such as double dose MiraLax, prune juice, or Milk of Magnesia.  Avoid using laxatives too often. Stool softeners can sometimes help counteract the constipating effects of narcotic pain medicines.  It can also cause diarrhea, so avoid using for too long. If you are still constipated despite taking fiber daily, eating solids, and a few doses of laxatives, call our office. Controlling diarrhea Try drinking liquids and eating bland foods for a few days to avoid stressing your intestines further. Avoid dairy products (especially milk & ice cream) for a short time.  The intestines often can lose the ability to digest lactose when stressed. Avoid foods that cause gassiness or  bloating.  Typical foods include beans and other legumes, cabbage, broccoli, and dairy foods.  Avoid greasy, spicy, fast foods.  Every person has   some sensitivity to other foods, so listen to your body and avoid those foods that trigger problems for you. Probiotics (such as active yogurt, Align, etc) may help repopulate the intestines and colon with normal bacteria and calm down a sensitive digestive tract Adding a fiber supplement gradually can help thicken stools by absorbing excess fluid and retrain the intestines to act more normally.  Slowly increase the dose over a few weeks.  Too much fiber too soon can backfire and cause cramping & bloating. It is okay to try and slow down diarrhea with a few doses of antidiarrheal medicines.   Bismuth subsalicylate (ex. Kayopectate, Pepto Bismol) for a few doses can help control diarrhea.  Avoid if pregnant.   Loperamide (Imodium) can slow down diarrhea.  Start with one tablet (2mg ) first.  Avoid if you are having fevers or severe pain.  ILEOSTOMY PATIENTS WILL HAVE CHRONIC DIARRHEA since their colon is not in use.    Drink plenty of liquids.  You will need to drink even more glasses of water/liquid a day to avoid getting dehydrated. Record output from your ileostomy.  Expect to empty the bag every 3-4 hours at first.  Most people with a permanent ileostomy empty their bag 4-6 times at the least.   Use antidiarrheal medicine (especially Imodium) several times a day to avoid getting dehydrated.  Start with a dose at bedtime & breakfast.  Adjust up or down as needed.  Increase antidiarrheal medications as directed to avoid emptying the bag more than 8 times a day (every 3 hours). Work with your wound ostomy nurse to learn care for your ostomy.  See ostomy care instructions. TROUBLESHOOTING IRREGULAR BOWELS 1) Start with a soft & bland diet. No spicy, greasy, or fried foods.  2) Avoid gluten/wheat or dairy products from diet to see if symptoms improve. 3) Miralax  17gm or flax seed mixed in [place]. water or juice-daily. May use 2-4 times a day as needed. 4) Gas-X, Phazyme, etc. as needed for gas & bloating.  5) Prilosec (omeprazole) over-the-counter as needed 6)  Consider probiotics (Align, Activa, etc) to help calm the bowels down  Call your doctor if you are getting worse or not getting better.  Sometimes further testing (cultures, endoscopy, X-ray studies, CT scans, bloodwork, etc.) may be needed to help diagnose and treat the cause of the diarrhea. [facility], [place], [place], [place], [place]  [id number] [phone] - Main.    [phone]  - Toll Free.   [phone] - Fax [email or web address]     Appendicitis, Adult  Appendicitis is inflammation of the appendix. The appendix is a finger-shaped tube that is attached to the large intestine. If appendicitis is not treated, it can cause the appendix to tear (rupture). A ruptured appendix can lead to a life-threatening infection. It can also cause a painful collection of pus (abscess) to form in the appendix. What are the causes? This condition may be caused by a blockage in the appendix that leads to infection. The blockage can be caused by:  A ball of stool (feces).  Enlarged lymph glands. In some cases, the cause may not be known. What increases the risk? Age is a risk factor. You are more likely to develop this condition if you are between 14 and 9 years of age. What are the signs or symptoms? Symptoms of this condition include:  Pain that starts around the belly button and moves toward the lower right part of the abdomen.  The pain can become more severe as time passes. It gets worse with coughing or sudden movements.  Tenderness in the lower right abdomen.  Nausea.  Vomiting.  Loss of appetite.  Fever.  Difficulty passing stool (constipation).  Passing very loose stools (diarrhea).  Generally feeling unwell. How is this  diagnosed? This condition may be diagnosed with:  A physical exam.  Blood tests.  Urine test. To confirm the diagnosis, an ultrasound, MRI, or CT scan may be done. How is this treated? This condition is usually treated with surgery to remove the appendix (appendectomy). There are two methods for doing an appendectomy:  Open appendectomy. In this surgery, the appendix is removed through a large incision that is made in the lower right abdomen. This procedure may be recommended if: ? You have major scarring from a previous surgery. ? You have a bleeding disorder. ? You are pregnant and are about to give birth. ? You have a condition that makes it hard to do surgery through small incisions (laparoscopic procedure). This includes severe infection or a ruptured appendix.  Laparoscopic appendectomy. In this surgery, the appendix is removed through small incisions. This procedure usually causes less pain and fewer problems than an open appendectomy. It also has a shorter recovery time. If the appendix has ruptured and an abscess has formed:  A drain may be placed into the abscess to remove fluid.  Antibiotic medicines may be given through an IV.  The appendix may or may not need to be removed. Follow these instructions at home: If you had surgery, follow instructions from your health care provider about how to care for yourself at home and how to care for your incision. Medicines  Take over-the-counter and prescription medicines only as told by your health care provider.  If you were prescribed an antibiotic medicine, take it as told by your health care provider. Do not stop taking the antibiotic even if you start to feel better. Eating and drinking  Follow instructions from your health care provider about eating restrictions. You may slowly resume a regular diet once your nausea or vomiting stops. General instructions  Do not use any products that contain nicotine or tobacco, such as  cigarettes, e-cigarettes, and chewing tobacco. If you need help quitting, ask your health care provider.  Do not drive or use heavy machinery while taking prescription pain medicine.  Ask your health care provider if the medicine prescribed to you can cause constipation. You may need to take steps to prevent or treat constipation, such as: ? Drink enough fluid to keep your urine pale yellow. ? Take over-the-counter or prescription medicines. ? Eat foods that are high in fiber, such as beans, whole grains, and fresh fruits and vegetables. ? Limit foods that are high in fat and processed sugars, such as fried or sweet foods.  Keep all follow-up visits as told by your health care provider. This is important. Contact a health care provider if:  There is pus, blood, or excessive drainage coming from your incision.  You have nausea or vomiting. Get help right away if you have:  Worsening abdominal pain.  A fever.  Chills.  Fatigue.  Muscle aches.  Shortness of breath. Summary  Appendicitis is inflammation of the appendix.  This condition may be caused by a blockage in the appendix that leads to infection.  This condition is usually treated with surgery to remove the appendix. This information is not intended to replace advice given to you by your health  care provider. Make sure you discuss any questions you have with your health care provider. Document Revised: 10/25/2017 Document Reviewed: 10/25/2017 Elsevier Patient Education  [place].

## 2020-01-04 NOTE — ED Provider Notes (Signed)
[facility] DEPT Provider Note   CSN: [id number] Arrival date & time: 01/03/20  1734     History Chief Complaint  Patient presents with  . Abdominal Pain    Toni Myers is a 69 y.o. female.  HPI   Patient is a 69 year old female with a medical history as noted below.  She states that 2 days ago she been experiencing a dull aching pain in the right lower quadrant of her abdomen.  This has been progressively worsening and is currently an 8/10.  It is constant.  She reports a fever of 39 F initially upon presentation of her sx but denies any fevers currently. She notes associated soft stools but no diarrhea. No melena or hematochezia. No dysuria, hematuria, constipation, syncope, URI sx, CP, SOB. She was evaluated by her primary care provider who recommended that she come to the emergency department for rule out of appendicitis. Pt is not anticoagulated.      Past Medical History:  Diagnosis Date  . Anxiety   . Arthritis    Rheumatoid  . Asthma   . Chronic iritis, left eye   . Chronic low back pain   . Closed fracture of multiple pubic rami ([place]) 07/19/2013  . Closed fracture of pelvic rim 07/19/2013  . Cough   . Depression   . Dyspnea   . Dysrhythmia    resolved  . GERD (gastroesophageal reflux disease)   . Hypertension   . Migraine    none in several years  . Palpitations   . RLS (restless legs syndrome)   . Shingles   . Sjogrens syndrome [facility])     Patient Active Problem List   Diagnosis Date Noted  . Osteoarthritis of right hip 11/15/2019  . OA (osteoarthritis) of hip 11/13/2019  . Primary osteoarthritis of right hip 11/13/2019  . Preoperative clearance 11/05/2019  . Chronic rhinitis 02/26/2019  . COPD (chronic obstructive pulmonary disease) ([place]) 05/24/2018  . Sinus tachycardia 03/22/2018  . Pneumonitis 12/17/2015  . Bronchospasm   . Chest congestion   . Low TSH level 10/10/2015  . Palpitations 10/10/2015  . Chest tightness  10/10/2015  . CKD (chronic kidney disease), stage II 10/08/2015  . Acute respiratory failure with hypoxia ([place]) 10/06/2015  . Vocal cord dysfunction 10/02/2014  . Dementia without behavioral disturbance ([place])   . GERD (gastroesophageal reflux disease)   . Uveitis 07/22/2013  . Rheumatoid arthritis ([place]) 07/22/2013  . Depression 08/31/2011  . RLS (restless legs syndrome) 08/31/2011  . Sjogren's disease ([place]) 08/31/2011  . Essential hypertension 08/06/2007  . Cough 08/06/2007    Past Surgical History:  Procedure Laterality Date  . BACK SURGERY     07/1998 and 03/1999, herniated disc, 2 rod and 4 screws  . COLONOSCOPY WITH PROPOFOL N/A 02/09/2015   Procedure: COLONOSCOPY WITH PROPOFOL;  Surgeon: Garlan Fair, MD;  Location: [facility] ENDOSCOPY;  Service: Endoscopy;  Laterality: N/A;  . ESOPHAGOGASTRODUODENOSCOPY (EGD) WITH PROPOFOL N/A 02/09/2015   Procedure: ESOPHAGOGASTRODUODENOSCOPY (EGD) WITH PROPOFOL;  Surgeon: Garlan Fair, MD;  Location: [facility] ENDOSCOPY;  Service: Endoscopy;  Laterality: N/A;  . EYE SURGERY Bilateral 2020  . TOTAL HIP ARTHROPLASTY Right 11/13/2019   Procedure: TOTAL HIP ARTHROPLASTY ANTERIOR APPROACH;  Surgeon: Gaynelle Arabian, MD;  Location: [facility] ORS;  Service: Orthopedics;  Laterality: Right;  12min  . TUBAL LIGATION       OB History    Gravida  2   Para  2   Term  2  Preterm      AB      Living        SAB      TAB      Ectopic      Multiple      Live Births              Family History  Problem Relation Age of Onset  . Arthritis Mother     Social History   Tobacco Use  . Smoking status: Never Smoker  . Smokeless tobacco: Never Used  Vaping Use  . Vaping Use: Never used  Substance Use Topics  . Alcohol use: Yes    Comment: 2 glasses monthly  . Drug use: No    Home Medications Prior to Admission medications   Medication Sig Start Date End Date Taking? Authorizing Provider  albuterol (PROAIR HFA) 108 (90 BASE) MCG/ACT inhaler  Inhale 2 puffs into the lungs every 6 (six) hours as needed for wheezing or shortness of breath.     [provider]  albuterol (PROVENTIL) (2.5 MG/3ML) 0.083% nebulizer solution Take 2.5 mg by nebulization every 4 (four) hours as needed for wheezing or shortness of breath. Reported on 10/22/2015    [provider]  budesonide-formoterol (SYMBICORT) 160-4.5 MCG/ACT inhaler Inhale 2 puffs into the lungs 2 (two) times daily. 12/21/18   Fenton Foy, NP  cyclobenzaprine (FLEXERIL) 10 MG tablet Take 1 tablet (10 mg total) by mouth at bedtime as needed. For spasms 07/22/13   Janece Canterbury, MD  escitalopram (LEXAPRO) 10 MG tablet Take 10 mg by mouth daily.    [provider]  gabapentin (NEURONTIN) 300 MG capsule Take 300-600 mg by mouth See admin instructions. Take 1 capsule (300 mg) by mouth twice daily & take 2 capsules (600 mg) by mouth at bedtime. 03/25/18   [provider]  loratadine (CLARITIN) 10 MG tablet Take 10 mg by mouth daily.    [provider]  methocarbamol (ROBAXIN) 500 MG tablet Take 1 tablet (500 mg total) by mouth every 6 (six) hours as needed for muscle spasms. 11/14/19   Maurice March, PA-C  methotrexate 250 MG/10ML injection Inject 15 mg into the vein every Wednesday. Injects 0.6 ml subcutaneously every Wed (in stomach).    [provider]  metoprolol succinate (TOPROL-XL) 25 MG 24 hr tablet Take 1 tablet (25 mg total) by mouth daily. 11/06/19   Imogene Burn, PA-C  oxyCODONE (OXY IR/ROXICODONE) 5 MG immediate release tablet Take 1-2 tablets (5-10 mg total) by mouth every 6 (six) hours as needed for moderate pain or severe pain. 11/14/19   Maurice March, PA-C  zoledronic acid (RECLAST) 5 MG/100ML SOLN injection Inject into the vein See admin instructions. Yearly 05/11/18   [provider]    Allergies    Morphine and related and Prednisone  Review of Systems   Review of Systems  All other systems reviewed and  are negative. Ten systems reviewed and are negative for acute change, except as noted in the HPI.    Physical Exam Updated Vital Signs BP (!) 177/98 (BP Location: Left Arm)   Pulse 99   Temp (!) 97.5 F (36.4 C) (Oral)   Resp 20   Ht 5\' 4"  (1.626 m)   Wt 59.4 kg   SpO2 100%   BMI 22.49 kg/m   Physical Exam Vitals and nursing note reviewed.  Constitutional:      General: She is not in acute distress.  Appearance: Normal appearance. She is well-developed and normal weight. She is not ill-appearing, toxic-appearing or diaphoretic.  HENT:     Head: Normocephalic and atraumatic.     Right Ear: External ear normal.     Left Ear: External ear normal.     Nose: Nose normal.     Mouth/Throat:     Mouth: Mucous membranes are moist.     Pharynx: Oropharynx is clear. No oropharyngeal exudate or posterior oropharyngeal erythema.  Eyes:     Extraocular Movements: Extraocular movements intact.  Cardiovascular:     Rate and Rhythm: Normal rate and regular rhythm.     Pulses: Normal pulses.     Heart sounds: Normal heart sounds. No murmur heard.  No friction rub. No gallop.   Pulmonary:     Effort: Pulmonary effort is normal. No respiratory distress.     Breath sounds: Normal breath sounds. No stridor. No wheezing, rhonchi or rales.  Abdominal:     General: Abdomen is flat and protuberant. A surgical scar is present.     Palpations: Abdomen is soft.     Tenderness: There is abdominal tenderness in the right lower quadrant, periumbilical area and left upper quadrant. There is guarding (Voluntary). There is no right CVA tenderness or left CVA tenderness. Positive signs include McBurney's sign. Negative signs include Murphy's sign.     Comments: Soft abdomen with voluntary guarding.  Tenderness noted in the left upper quadrant, periumbilical region, right lower quadrant.  Pain is worst in the right lower quadrant.  Musculoskeletal:        General: Normal range of motion.     Cervical  back: Normal range of motion and neck supple. No tenderness.  Skin:    General: Skin is warm and dry.  Neurological:     General: No focal deficit present.     Mental Status: She is alert and oriented to person, place, and time.  Psychiatric:        Mood and Affect: Mood normal.        Behavior: Behavior normal.    ED Results / Procedures / Treatments   Labs (all labs ordered are listed, but only abnormal results are displayed) Labs Reviewed  COMPREHENSIVE METABOLIC PANEL - Abnormal; Notable for the following components:      Result Value   Glucose, Bld 101 (*)    Total Protein 8.2 (*)    Total Bilirubin 1.3 (*)    All other components within normal limits  CBC - Abnormal; Notable for the following components:   Hemoglobin 11.2 (*)    HCT 35.5 (*)    All other components within normal limits  URINALYSIS, ROUTINE W REFLEX MICROSCOPIC - Abnormal; Notable for the following components:   Ketones, ur 5 (*)    All other components within normal limits  SARS CORONAVIRUS 2 BY RT PCR (HOSPITAL ORDER, [facility])  LIPASE, BLOOD   EKG None  Radiology CT ABDOMEN PELVIS W CONTRAST  Result Date: 01/04/2020 CLINICAL DATA:  Right lower quadrant pain and fever 2 days. Suspect appendicitis. EXAM: CT ABDOMEN AND PELVIS WITH CONTRAST TECHNIQUE: Multidetector CT imaging of the abdomen and pelvis was performed using the standard protocol following bolus administration of intravenous contrast. CONTRAST:  172mL OMNIPAQUE IOHEXOL 300 MG/ML  SOLN COMPARISON:  Chest CT 08/03/2018 FINDINGS: Lower chest: Bibasilar scarring unchanged. Hepatobiliary: Liver, gallbladder and biliary tree are normal. Pancreas: Normal. Spleen: Normal. Adrenals/Urinary Tract: Adrenal glands are normal. Kidneys are normal in size  without hydronephrosis or nephrolithiasis. Ureters and bladder are normal. Stomach/Bowel: Stomach and small bowel are normal. Somewhat redundant hepatic flexure. There is mild  fecal retention throughout the colon. There is mild colonic diverticulosis. There is a short appendix which is mildly enlarged measuring 1 cm with mild adjacent inflammatory change of the periappendiceal fat. Tiny amount of fluid in the adjacent pericolic gutter. Findings likely due to mild acute appendicitis. There is no evidence of perforation or abscess. Vascular/Lymphatic: Minimal calcified plaque over the abdominal aorta which is normal in caliber. No evidence of adenopathy. Reproductive: Normal. Other: None. Musculoskeletal: Degenerative changes spine with severe biphasic curvature of the thoracolumbar spine with curvature of the lumbar spine convex left. Posterior fusion hardware with interbody fusion at the L5-S1 level. Right hip arthroplasty. Mild degenerate change of the left hip. Old right pelvic fractures involving the pubic rami and symphysis. IMPRESSION: 1. Findings compatible with mild acute appendicitis. No evidence of perforation or abscess. 2. Mild colonic diverticulosis. 3. Aortic atherosclerosis. Aortic Atherosclerosis (ICD10-I70.0). These results were called by telephone at the time of interpretation on 01/04/2020 at 12:43 pm to provider [facility] , who verbally acknowledged these results. Electronically Signed   By: Marin Olp M.D.   On: 01/04/2020 12:43    Procedures Procedures   Medications Ordered in ED Medications  sodium chloride (PF) 0.9 % injection (has no administration in time range)  cefTRIAXone (ROCEPHIN) 2 g in sodium chloride 0.9 % 100 mL IVPB (0 g Intravenous Stopped 01/04/20 1349)    And  metroNIDAZOLE (FLAGYL) IVPB 500 mg (500 mg Intravenous New Bag/Given 01/04/20 1348)  Chlorhexidine Gluconate Cloth 2 % PADS 6 each (has no administration in time range)    And  Chlorhexidine Gluconate Cloth 2 % PADS 6 each (has no administration in time range)  acetaminophen (TYLENOL) tablet 1,000 mg (has no administration in time range)  gabapentin (NEURONTIN) capsule 300  mg (has no administration in time range)  cefoTEtan (CEFOTAN) 2 g in sodium chloride 0.9 % 100 mL IVPB (has no administration in time range)  bupivacaine liposome (EXPAREL) 1.3 % injection 266 mg (has no administration in time range)  sodium chloride 0.9 % bolus 500 mL (0 mLs Intravenous Stopped 01/04/20 1314)  acetaminophen (TYLENOL) tablet 650 mg (650 mg Oral Given 01/04/20 1055)  iohexol (OMNIPAQUE) 300 MG/ML solution 100 mL (100 mLs Intravenous Contrast Given 01/04/20 1156)   ED Course  I have reviewed the triage vital signs and the nursing notes.  Pertinent labs & imaging results that were available during my care of the patient were reviewed by me and considered in my medical decision making (see chart for details).  Clinical Course as of Jan 03 1313  Sat Jan 04, 2020  1034 No leukocytosis  WBC: 6.6 [LJ]  1035 Similar to prior values  Hemoglobin(!): 11.2 [LJ]  1035 Afebrile  Temp: 98.2 F (36.8 C) [LJ]  1256 1. Findings compatible with mild acute appendicitis. No evidence of perforation or abscess. 2. Mild colonic diverticulosis. 3. Aortic atherosclerosis.  CT ABDOMEN PELVIS W CONTRAST [LJ]    Clinical Course User Index [LJ] Rayna Sexton, PA-C   MDM Rules/Calculators/A&P                          Pt is a 69 y.o. female that presents with a history, physical exam, and ED Clinical Course as noted above.   Pt presents today with diffuse worsening abdominal pain that is worst in the  RLQ. CT obtained showing an acute appendicitis.  Patient given 500 cc of IV fluids, Tylenol for pain, Rocephin as well as Flagyl.  Discussed with Dr. Johney Maine with general surgery who is planning on evaluating the patient.  COVID-19 test ordered.  Note: Portions of this report may have been transcribed using voice recognition software. Every effort was made to ensure accuracy; however, inadvertent computerized transcription errors may be present.   Final Clinical Impression(s) / ED Diagnoses Final  diagnoses:  Acute appendicitis, unspecified acute appendicitis type   Rx / DC Orders ED Discharge Orders    None       Rayna Sexton, PA-C 01/04/20 1415    Dorie Rank, MD 01/06/20 [phone]

## 2020-01-04 NOTE — Anesthesia Procedure Notes (Signed)
Procedure Name: Intubation Date/Time: 01/04/2020 3:02 PM Performed by: Silas Sacramento, CRNA Pre-anesthesia Checklist: Patient identified, Emergency Drugs available, Suction available and Patient being monitored Patient Re-evaluated:Patient Re-evaluated prior to induction Oxygen Delivery Method: Circle system utilized Preoxygenation: Pre-oxygenation with 100% oxygen Induction Type: IV induction Ventilation: Mask ventilation without difficulty Laryngoscope Size: Mac and 4 Grade View: Grade I Tube type: Oral Tube size: 7.0 mm Number of attempts: 1 Airway Equipment and Method: Stylet and Oral airway Placement Confirmation: ETT inserted through vocal cords under direct vision,  positive ETCO2 and breath sounds checked- equal and bilateral Secured at: 21 cm Tube secured with: Tape Dental Injury: Teeth and Oropharynx as per pre-operative assessment

## 2020-01-05 ENCOUNTER — Encounter (HOSPITAL_COMMUNITY): Payer: Self-pay | Admitting: Surgery

## 2020-01-05 NOTE — Progress Notes (Signed)
1 Day Post-Op   Subjective/Chief Complaint: Comfortable from a surgery standpoint but having issues moving left leg and walking secondary to siatica   Objective: Vital signs in last 24 hours: Temp:  [94.5 F (34.7 C)-97.9 F (36.6 C)] 97.4 F (36.3 C) (08/15 0452) Pulse Rate:  [74-110] 80 (08/15 0452) Resp:  [12-24] 14 (08/15 0452) BP: (102-177)/(56-104) 102/84 (08/15 0452) SpO2:  [94 %-100 %] 94 % (08/15 0452)    Intake/Output from previous day: 08/14 0701 - 08/15 0700 In: 1290.3 [I.V.:220; IV Piggyback:1070.3] Out: 830 [Urine:800; Blood:30] Intake/Output this shift: No intake/output data recorded.  Exam: Awake and alert Comfortable Abdomen soft, dressing dry Difficulty lifting left leg  Lab Results:  Recent Labs    01/03/20 1801  WBC 6.6  HGB 11.2*  HCT 35.5*  PLT 360   BMET Recent Labs    01/03/20 1801  NA 136  K 3.7  CL 101  CO2 25  GLUCOSE 101*  BUN 14  CREATININE 0.75  CALCIUM 9.3   PT/INR No results for input(s): LABPROT, INR in the last 72 hours. ABG No results for input(s): PHART, HCO3 in the last 72 hours.  Invalid input(s): PCO2, PO2  Studies/Results: CT ABDOMEN PELVIS W CONTRAST  Result Date: 01/04/2020 CLINICAL DATA:  Right lower quadrant pain and fever 2 days. Suspect appendicitis. EXAM: CT ABDOMEN AND PELVIS WITH CONTRAST TECHNIQUE: Multidetector CT imaging of the abdomen and pelvis was performed using the standard protocol following bolus administration of intravenous contrast. CONTRAST:  156mL OMNIPAQUE IOHEXOL 300 MG/ML  SOLN COMPARISON:  Chest CT 08/03/2018 FINDINGS: Lower chest: Bibasilar scarring unchanged. Hepatobiliary: Liver, gallbladder and biliary tree are normal. Pancreas: Normal. Spleen: Normal. Adrenals/Urinary Tract: Adrenal glands are normal. Kidneys are normal in size without hydronephrosis or nephrolithiasis. Ureters and bladder are normal. Stomach/Bowel: Stomach and small bowel are normal. Somewhat redundant hepatic  flexure. There is mild fecal retention throughout the colon. There is mild colonic diverticulosis. There is a short appendix which is mildly enlarged measuring 1 cm with mild adjacent inflammatory change of the periappendiceal fat. Tiny amount of fluid in the adjacent pericolic gutter. Findings likely due to mild acute appendicitis. There is no evidence of perforation or abscess. Vascular/Lymphatic: Minimal calcified plaque over the abdominal aorta which is normal in caliber. No evidence of adenopathy. Reproductive: Normal. Other: None. Musculoskeletal: Degenerative changes spine with severe biphasic curvature of the thoracolumbar spine with curvature of the lumbar spine convex left. Posterior fusion hardware with interbody fusion at the L5-S1 level. Right hip arthroplasty. Mild degenerate change of the left hip. Old right pelvic fractures involving the pubic rami and symphysis. IMPRESSION: 1. Findings compatible with mild acute appendicitis. No evidence of perforation or abscess. 2. Mild colonic diverticulosis. 3. Aortic atherosclerosis. Aortic Atherosclerosis (ICD10-I70.0). These results were called by telephone at the time of interpretation on 01/04/2020 at 12:43 pm to provider [facility] , who verbally acknowledged these results. Electronically Signed   By: Marin Olp M.D.   On: 01/04/2020 12:43    Anti-infectives: Anti-infectives (From admission, onward)   Start     Dose/Rate Route Frequency Ordered Stop   01/05/20 1300  cefTRIAXone (ROCEPHIN) 2 g in sodium chloride 0.9 % 100 mL IVPB     Discontinue    "And" Linked Group Details   2 g 200 mL/hr over 30 Minutes Intravenous Every 24 hours 01/04/20 1614     01/04/20 2200  metroNIDAZOLE (FLAGYL) IVPB 500 mg     Discontinue    "And" Linked Group  Details   500 mg 100 mL/hr over 60 Minutes Intravenous Every 8 hours 01/04/20 1614     01/04/20 1515  cefoTEtan (CEFOTAN) 2 g in sodium chloride 0.9 % 100 mL IVPB        2 g 200 mL/hr over 30 Minutes  Intravenous On call to O.R. 01/04/20 1340 01/04/20 1553   01/04/20 1300  cefTRIAXone (ROCEPHIN) 2 g in sodium chloride 0.9 % 100 mL IVPB       "And" Linked Group Details   2 g 200 mL/hr over 30 Minutes Intravenous  Once 01/04/20 1252 01/04/20 1349   01/04/20 1300  metroNIDAZOLE (FLAGYL) IVPB 500 mg       "And" Linked Group Details   500 mg 100 mL/hr over 60 Minutes Intravenous  Once 01/04/20 1252 01/04/20 1448      Assessment/Plan: s/p Procedure(s): APPENDECTOMY LAPAROSCOPIC (N/A)  S/p lap appy  Lives alone.  High risk for fall secondary to left leg issues/sciatica Will need to stay until at least tomorrow  LOS: 0 days    Coralie Keens 01/05/2020

## 2020-01-06 MED ORDER — ACETAMINOPHEN 500 MG PO TABS: 1000.0000 mg | ORAL_TABLET | Freq: Three times a day (TID) | ORAL | 2 refills | Status: AC | PRN

## 2020-01-06 NOTE — Discharge Summary (Signed)
Patient ID: Toni Myers [id number] [birth year] 69 y.o.  Admit date: 01/04/2020 Discharge date: 01/06/2020  Admitting Diagnosis: Acute Appendicitis   Discharge Diagnosis Patient Active Problem List   Diagnosis Date Noted  . Acute phlegmonous appendicitis s/p lap appendectomy 01/04/2020 01/04/2020  . Osteoporosis 01/04/2020  . Osteopenia 01/04/2020  . Dementia ([place]) 01/04/2020  . History of total hip replacement, right 11/26/2019  . Primary osteoarthritis of right hip 11/13/2019  . Degeneration of lumbar intervertebral disc 07/26/2019  . Chronic rhinitis 02/26/2019  . Pain of left hip joint 11/20/2018  . Chronic obstructive lung disease ([place]) 05/24/2018  . Sinus tachycardia 03/22/2018  . Pneumonitis 12/17/2015  . Bronchospasm   . Chest congestion   . Decreased thyroid stimulating hormone level 10/10/2015  . Palpitations 10/10/2015  . Tight chest 10/10/2015  . CKD (chronic kidney disease), stage II 10/08/2015  . Acute respiratory failure with hypoxia ([place]) 10/06/2015  . Vocal cord dysfunction 10/02/2014  . Blepharitis of both eyes 08/06/2014  . Insufficiency of tear film of both eyes 08/06/2014  . Dementia without behavioral disturbance ([place])   . Gastroesophageal reflux disease   . Uveitis 07/22/2013  . Rheumatoid arthritis ([place]) 07/22/2013  . Amblyopia 04/24/2013  . Myopia with astigmatism 04/24/2013  . Nuclear cataract of both eyes 04/24/2013  . Posterior vitreous detachment of left eye 04/24/2013  . Depression 08/31/2011  . RLS (restless legs syndrome) 08/31/2011  . Sjogren's disease ([place]) 08/31/2011  . Idiopathic scoliosis 10/15/2009  . S/P lumbar spinal fusion 10/15/2009  . Pseudoarthrosis 10/15/2009  . Essential hypertension 08/06/2007  . Cough 08/06/2007    Consultants None   H&P:  Paged at 1:04pm today by [facility] ED. 69 year old human with 2-day history of crampy abdominal pain.  Normally has some constipation, moving her bowels every other day.  History of  colonoscopies in the past.  She cannot remember exactly who did them but recalls them being normal underwhelming.  Patient noticed she had worsening abdominal pain lower area became more intense.  Discussed with her primary care physician.  They recommend she go to emergency room.  She had it there yesterday evening around 5:00.  Eventually able to come in and be evaluated with exam and CT scan finished noontime today concerning for appendicitis.  Surgical consultation requested.  Patient has a history tubal ligation but no other abdominal surgeries.  She normally can walk a couple miles without difficulty.  She does have some rheumatoid arthritis issues.  She had right knee surgery done by Dr. Lucienne Capers and is still recovering from that.  That was 2 months ago.  Back to most day-to-day activities.  No personal nor family history of GI/colon cancer, inflammatory bowel disease, irritable bowel syndrome, allergy such as Celiac Sprue, dietary/dairy problems, colitis, ulcers nor gastritis.  No recent sick contacts/gastroenteritis.  No travel outside the country.  No changes in diet.  No dysphagia to solids or liquids.  No significant heartburn or reflux.  No hematochezia, hematemesis, coffee ground emesis.  No evidence of prior gastric/peptic ulceration.  Procedures Dr. Johney Maine - Laparoscopic Appendectomy w/ bilateral tap block - 01/04/2020  Hospital Course:  [name] was admitted to the general surgery service for acute appendicitis. Patient underwent Laparoscopic Appendectomy w/ bilateral tap block by Dr. Johney Maine on 8/14. On POD #1 patient was suffering from some sciatica pain. On POD2, her pain had resolved and she was walking in the halls at her baseline with a walker; voiding well, tolerating a diet, pain well  controlled, vital signs stable, incisions c/d/i and felt stable for discharge home. Follow up arranged and as noted below. I reviewed patient in the database, and she was noted to have several large  Rx fills for pain medication fills in the last 1 year. I discussed this with the patient who reports that she does not need any pain medication at discharge.   Physical Exam: Gen:  Alert, NAD, pleasant Card:  RRR Pulm:  CTAB, no W/R/R, effort normal Abd: Soft, ND, moderate tenderness around the laparoscopic umbilical incision, +BS, tegaderm dressing in place. Ext:  No LE edema  Psych: A&Ox3  Skin: no rashes noted, warm and dry  Allergies as of 01/06/2020      Reactions   Morphine And Related Nausea And Vomiting   "makes me sick at high doses"; doesn't like to take opioids   Prednisone Other (See Comments)   Causes insomnia by the 3rd day of taking.prednisone taper.      Medication List    STOP taking these medications   cyclobenzaprine 10 MG tablet Commonly known as: FLEXERIL     TAKE these medications   acetaminophen 500 MG tablet Commonly known as: TYLENOL Take 2 tablets (1,000 mg total) by mouth every 8 (eight) hours as needed.   budesonide-formoterol 160-4.5 MCG/ACT inhaler Commonly known as: SYMBICORT Inhale 2 puffs into the lungs 2 (two) times daily.   celecoxib 200 MG capsule Commonly known as: CELEBREX Take 200 mg by mouth daily.   escitalopram 10 MG tablet Commonly known as: LEXAPRO Take 10 mg by mouth daily.   folic acid 1 MG tablet Commonly known as: FOLVITE Take 3 mg by mouth daily.   gabapentin 300 MG capsule Commonly known as: NEURONTIN Take 300-600 mg by mouth See admin instructions. Take 1 capsule (300 mg) by mouth twice daily & take 2 capsules (600 mg) by mouth at bedtime.   loratadine 10 MG tablet Commonly known as: CLARITIN Take 10 mg by mouth daily as needed for allergies.   methocarbamol 500 MG tablet Commonly known as: ROBAXIN Take 1 tablet (500 mg total) by mouth every 6 (six) hours as needed for muscle spasms.   metoprolol succinate 25 MG 24 hr tablet Commonly known as: TOPROL-XL Take 1 tablet (25 mg total) by mouth daily.     multivitamin tablet Take 1 tablet by mouth daily.   oxyCODONE 5 MG immediate release tablet Commonly known as: Oxy IR/ROXICODONE Take 1-2 tablets (5-10 mg total) by mouth every 6 (six) hours as needed for moderate pain or severe pain.   albuterol (2.5 MG/3ML) 0.083% nebulizer solution Commonly known as: PROVENTIL Take 2.5 mg by nebulization every 4 (four) hours as needed for wheezing or shortness of breath. Reported on 10/22/2015   ProAir HFA 108 (90 Base) MCG/ACT inhaler Generic drug: albuterol Inhale 2 puffs into the lungs every 6 (six) hours as needed for wheezing or shortness of breath.   triamcinolone 0.1 % cream : eucerin Crea Apply 1 application topically daily.         Follow-up [facility], [place]. Go on 01/28/2020.   Specialty: General Surgery Why: @845am . Please arrive to your appointment 30 minutes early for paperwork. Please bring a copy of your photo ID and insurance card.  Contact information: [address] [place] [phone]       Cyndi Bender, PA-C Follow up.   Specialty: Physician Radio producer information: [address] [place] [address]  [phone]               Signed: Alferd Apa, [facility] 01/06/2020, 12:17 PM Please see Amion for pager number during day hours 7:00am-4:30pm

## 2020-01-06 NOTE — Plan of Care (Signed)
Instructions were reviewed with patient. All questions were answered. Patient was transported to main entrance by wheelchair. ° °

## 2020-01-07 LAB — SURGICAL PATHOLOGY

## 2020-01-16 DIAGNOSIS — J439 Emphysema, unspecified: Secondary | ICD-10-CM | POA: Diagnosis not present

## 2020-01-16 DIAGNOSIS — K3580 Unspecified acute appendicitis: Secondary | ICD-10-CM | POA: Diagnosis not present

## 2020-01-16 DIAGNOSIS — R197 Diarrhea, unspecified: Secondary | ICD-10-CM | POA: Diagnosis not present

## 2020-01-16 DIAGNOSIS — Z6824 Body mass index (BMI) 24.0-24.9, adult: Secondary | ICD-10-CM | POA: Diagnosis not present

## 2020-01-16 DIAGNOSIS — J984 Other disorders of lung: Secondary | ICD-10-CM | POA: Diagnosis not present

## 2020-01-16 DIAGNOSIS — Z79899 Other long term (current) drug therapy: Secondary | ICD-10-CM | POA: Diagnosis not present

## 2020-01-31 DIAGNOSIS — Z6824 Body mass index (BMI) 24.0-24.9, adult: Secondary | ICD-10-CM | POA: Diagnosis not present

## 2020-01-31 DIAGNOSIS — M25511 Pain in right shoulder: Secondary | ICD-10-CM | POA: Diagnosis not present

## 2020-01-31 DIAGNOSIS — R197 Diarrhea, unspecified: Secondary | ICD-10-CM | POA: Diagnosis not present

## 2020-02-07 DIAGNOSIS — M25511 Pain in right shoulder: Secondary | ICD-10-CM | POA: Diagnosis not present

## 2020-02-25 DIAGNOSIS — M25519 Pain in unspecified shoulder: Secondary | ICD-10-CM | POA: Diagnosis not present

## 2020-03-09 ENCOUNTER — Other Ambulatory Visit: Payer: Self-pay

## 2020-03-09 ENCOUNTER — Ambulatory Visit: Payer: PPO | Admitting: Primary Care

## 2020-03-13 DIAGNOSIS — S42001A Fracture of unspecified part of right clavicle, initial encounter for closed fracture: Secondary | ICD-10-CM | POA: Diagnosis not present

## 2020-03-13 DIAGNOSIS — M25511 Pain in right shoulder: Secondary | ICD-10-CM | POA: Diagnosis not present

## 2020-03-16 ENCOUNTER — Ambulatory Visit: Payer: PPO | Admitting: Cardiovascular Disease

## 2020-03-26 ENCOUNTER — Encounter: Payer: Self-pay | Admitting: Cardiovascular Disease

## 2020-03-26 ENCOUNTER — Other Ambulatory Visit: Payer: Self-pay

## 2020-03-26 ENCOUNTER — Ambulatory Visit: Payer: PPO | Admitting: Cardiovascular Disease

## 2020-03-26 VITALS — BP 130/78 | HR 90 | Ht 64.0 in | Wt 134.4 lb

## 2020-03-26 DIAGNOSIS — R000 Tachycardia, unspecified: Secondary | ICD-10-CM

## 2020-03-26 MED ORDER — METOPROLOL SUCCINATE ER 25 MG PO TB24: 25.0000 mg | ORAL_TABLET | Freq: Every day | ORAL | 3 refills | Status: DC

## 2020-03-26 NOTE — Progress Notes (Signed)
Cardiology Office Note:    Date:  03/26/2020   ID:  TACHE BOBST, DOB 07-14-1950, MRN [id number]  PCP:  Cyndi Bender, PA-C  Cardiologist:  Mertie Moores, MD  Electrophysiologist:  None   Referring MD: Cyndi Bender, PA-C   Chief Complaint  Patient presents with  . Tachycardia     Oct. 31, 2019   Toni Myers is a 69 y.o. female with a hx of  COPD, HTN, Lupus,   She has a history of hypertension.  She also has a history of sinus tachycardia.  We are asked to see her today by [name], PA for further evaluation of her hypertension and sinus tachycardia.  Aviva Cipriano [place]")  is seen back today . I've seen her in the remote past - for ? HTN She has COPD and has never smoked.   Had lots of 2nd hand smoke  She has had  Tachycardia  Was started on Atenolol and her tachycardia is better.   Seen Dr. Lamonte Sakai for her COPD .   Has been losing weight . - 20 lbs  Has lots of fatigue   Jan. 31, 2020:  Toni Myers is seen back today for follow-up visit for her sinus tachycardia.  She has a history of COPD ( lifelong nonsmoker)  and hypertension. Has never smoked.   Has had COPD for years - she is not sure if she has alpha 1 antitrypsin deficiency Is doing well No caridac issues  Nov. 4, 2021:  Toni Myers is seen today for follow up of her snius tachycardia. Has COPD, has never smoked She was hospitalized with appendicitis in Aug. 2021 Had hip replacement ([place]) , is doing well .   Past Medical History:  Diagnosis Date  . Anxiety   . Arthritis    Rheumatoid  . Asthma   . Chronic iritis, left eye   . Chronic low back pain   . Closed fracture of multiple pubic rami ([place]) 07/19/2013  . Closed fracture of pelvic rim 07/19/2013  . Cough   . Depression   . Dyspnea   . Dysrhythmia    resolved  . GERD (gastroesophageal reflux disease)   . Hypertension   . Migraine    none in several years  . Palpitations   . RLS (restless legs syndrome)   . Shingles   . Sjogrens syndrome [facility])       Past Surgical History:  Procedure Laterality Date  . BACK SURGERY     07/1998 and 03/1999, herniated disc, 2 rod and 4 screws  . COLONOSCOPY WITH PROPOFOL N/A 02/09/2015   Procedure: COLONOSCOPY WITH PROPOFOL;  Surgeon: Garlan Fair, MD;  Location: [facility];  Service: Endoscopy;  Laterality: N/A;  . ESOPHAGOGASTRODUODENOSCOPY (EGD) WITH PROPOFOL N/A 02/09/2015   Procedure: ESOPHAGOGASTRODUODENOSCOPY (EGD) WITH PROPOFOL;  Surgeon: Garlan Fair, MD;  Location: [facility];  Service: Endoscopy;  Laterality: N/A;  . EYE SURGERY Bilateral 2020  . LAPAROSCOPIC APPENDECTOMY N/A 01/04/2020   Procedure: APPENDECTOMY LAPAROSCOPIC;  Surgeon: Michael Boston, MD;  Location: [facility];  Service: General;  Laterality: N/A;  . TOTAL HIP ARTHROPLASTY Right 11/13/2019   Procedure: TOTAL HIP ARTHROPLASTY ANTERIOR APPROACH;  Surgeon: Gaynelle Arabian, MD;  Location: [facility];  Service: Orthopedics;  Laterality: Right;  124min  . TUBAL LIGATION      Current Medications: Current Meds  Medication Sig  . acetaminophen (TYLENOL) 500 MG tablet Take 2 tablets (1,000 mg total) by mouth every 8 (eight) hours as needed.  Marland Kitchen albuterol (  PROAIR HFA) 108 (90 BASE) MCG/ACT inhaler Inhale 2 puffs into the lungs every 6 (six) hours as needed for wheezing or shortness of breath.   Marland Kitchen albuterol (PROVENTIL) (2.5 MG/3ML) 0.083% nebulizer solution Take 2.5 mg by nebulization every 4 (four) hours as needed for wheezing or shortness of breath. Reported on 10/22/2015  . budesonide-formoterol (SYMBICORT) 160-4.5 MCG/ACT inhaler Inhale 2 puffs into the lungs 2 (two) times daily.  . celecoxib (CELEBREX) 200 MG capsule Take 200 mg by mouth daily.  Marland Kitchen escitalopram (LEXAPRO) 10 MG tablet Take 10 mg by mouth daily.  . folic acid (FOLVITE) 1 MG tablet Take 3 mg by mouth daily.   Marland Kitchen gabapentin (NEURONTIN) 300 MG capsule Take 300-600 mg by mouth See admin instructions. Take 1 capsule (300 mg) by mouth twice daily & take 2 capsules (600 mg) by  mouth at bedtime.  Marland Kitchen loratadine (CLARITIN) 10 MG tablet Take 10 mg by mouth daily as needed for allergies.   . methocarbamol (ROBAXIN) 500 MG tablet Take 1 tablet (500 mg total) by mouth every 6 (six) hours as needed for muscle spasms.  . metoprolol succinate (TOPROL-XL) 25 MG 24 hr tablet Take 1 tablet (25 mg total) by mouth daily.  . Multiple Vitamin (MULTIVITAMIN) tablet Take 1 tablet by mouth daily.  Marland Kitchen oxyCODONE (OXY IR/ROXICODONE) 5 MG immediate release tablet Take 1-2 tablets (5-10 mg total) by mouth every 6 (six) hours as needed for moderate pain or severe pain.  . [DISCONTINUED] metoprolol succinate (TOPROL-XL) 25 MG 24 hr tablet Take 1 tablet (25 mg total) by mouth daily.     Allergies:   Morphine and related and Prednisone   Social History   Socioeconomic History  . Marital status: Divorced    Spouse name: Not on file  . Number of children: Not on file  . Years of education: Not on file  . Highest education level: Not on file  Occupational History  . Occupation: retired  Tobacco Use  . Smoking status: Never Smoker  . Smokeless tobacco: Never Used  Vaping Use  . Vaping Use: Never used  Substance and Sexual Activity  . Alcohol use: Yes    Comment: 2 glasses monthly  . Drug use: No  . Sexual activity: Not on file  Other Topics Concern  . Not on file  Social History Narrative  . Not on file   Social Determinants of Health   Financial Resource Strain:   . Difficulty of Paying Living Expenses: Not on file  Food Insecurity:   . Worried About Charity fundraiser in the Last Year: Not on file  . Ran Out of Food in the Last Year: Not on file  Transportation Needs:   . Lack of Transportation (Medical): Not on file  . Lack of Transportation (Non-Medical): Not on file  Physical Activity:   . Days of Exercise per Week: Not on file  . Minutes of Exercise per Session: Not on file  Stress:   . Feeling of Stress : Not on file  Social Connections:   . Frequency of  Communication with Friends and Family: Not on file  . Frequency of Social Gatherings with Friends and Family: Not on file  . Attends Religious Services: Not on file  . Active Member of Clubs or Organizations: Not on file  . Attends Archivist Meetings: Not on file  . Marital Status: Not on file     Family History: The patient's family history includes Arthritis in her mother.  ROS:   Please see the history of present illness.     All other systems reviewed and are negative.  EKGs/Labs/Other Studies Reviewed:    The following studies were reviewed today:   EKG:   March 22, 2018: Normal sinus rhythm at 71 beats minute.  Normal EKG.  Recent Labs: 01/03/2020: ALT 12; BUN 14; Creatinine, Ser 0.75; Hemoglobin 11.2; Platelets 360; Potassium 3.7; Sodium 136  Recent Lipid Panel No results found for: CHOL, TRIG, HDL, CHOLHDL, VLDL, LDLCALC, LDLDIRECT    Physical Exam: Blood pressure 130/78, pulse 90, height 5\' 4"  (1.626 m), weight 134 lb 6.4 oz (61 kg), SpO2 94 %.  GEN:  Well nourished, well developed in no acute distress HEENT: Normal NECK: No JVD; No carotid bruits LYMPHATICS: No lymphadenopathy CARDIAC: RRR , no murmurs, rubs, gallops RESPIRATORY:  Few basilar rales,   ABDOMEN: Soft, non-tender, non-distended MUSCULOSKELETAL:  No edema; No deformity  SKIN: Warm and dry NEUROLOGIC:  Alert and oriented x 3     ASSESSMENT:    1. Sinus tachycardia    PLAN:      1. Sinus tachycardia:  . She is tolerating the metoprolol 25 mg a day .  Refill Cont other meds.   3.  COPD :   She has never smoked.   Was around lots of second hand smoke from her ex-husband.     Medication Adjustments/Labs and Tests Ordered: Current medicines are reviewed at length with the patient today.  Concerns regarding medicines are outlined above.  No orders of the defined types were placed in this encounter.  Meds ordered this encounter  Medications  . metoprolol succinate  (TOPROL-XL) 25 MG 24 hr tablet    Sig: Take 1 tablet (25 mg total) by mouth daily.    Dispense:  90 tablet    Refill:  3    Dose increased     Patient Instructions  Medication Instructions:  Your physician recommends that you continue on your current medications as directed. Please refer to the Current Medication list given to you today.  *If you need a refill on your cardiac medications before your next appointment, please call your pharmacy*   Lab Work: None Ordered If you have labs (blood work) drawn today and your tests are completely normal, you will receive your results only by: Marland Kitchen MyChart Message (if you have MyChart) OR . A paper copy in the mail If you have any lab test that is abnormal or we need to change your treatment, we will call you to review the results.    Testing/Procedures: None Ordered    Follow-Up: At [facility], you and your health needs are our priority.  As part of our continuing mission to provide you with exceptional heart care, we have created designated Provider Care Teams.  These Care Teams include your primary Cardiologist (physician) and Advanced Practice Providers (APPs -  Physician Assistants and Nurse Practitioners) who all work together to provide you with the care you need, when you need it.   Your next appointment:   1 year(s)  The format for your next appointment:   In Person  Provider:   You may see Mertie Moores, MD or one of the following Advanced Practice Providers on your designated Care Team:    Richardson Dopp, PA-C  Robbie Lis, [place]        Signed, Mertie Moores, MD  03/26/2020 11:50 AM    [place]

## 2020-03-26 NOTE — Patient Instructions (Signed)

## 2020-04-01 DIAGNOSIS — M542 Cervicalgia: Secondary | ICD-10-CM | POA: Diagnosis not present

## 2020-04-08 ENCOUNTER — Ambulatory Visit: Payer: PPO | Admitting: Emergency Medicine

## 2020-04-22 DIAGNOSIS — M25511 Pain in right shoulder: Secondary | ICD-10-CM | POA: Diagnosis not present

## 2020-05-08 DIAGNOSIS — M25552 Pain in left hip: Secondary | ICD-10-CM | POA: Diagnosis not present

## 2020-05-08 DIAGNOSIS — M1612 Unilateral primary osteoarthritis, left hip: Secondary | ICD-10-CM | POA: Diagnosis not present

## 2020-05-17 DIAGNOSIS — Z1152 Encounter for screening for COVID-19: Secondary | ICD-10-CM | POA: Diagnosis not present

## 2020-05-17 DIAGNOSIS — J019 Acute sinusitis, unspecified: Secondary | ICD-10-CM | POA: Diagnosis not present

## 2020-05-18 ENCOUNTER — Other Ambulatory Visit: Payer: Self-pay | Admitting: Emergency Medicine

## 2020-05-21 DIAGNOSIS — J441 Chronic obstructive pulmonary disease with (acute) exacerbation: Secondary | ICD-10-CM | POA: Diagnosis not present

## 2020-05-21 DIAGNOSIS — Z20822 Contact with and (suspected) exposure to covid-19: Secondary | ICD-10-CM | POA: Diagnosis not present

## 2020-06-22 DIAGNOSIS — R350 Frequency of micturition: Secondary | ICD-10-CM | POA: Diagnosis not present

## 2020-06-22 DIAGNOSIS — Z6823 Body mass index (BMI) 23.0-23.9, adult: Secondary | ICD-10-CM | POA: Diagnosis not present

## 2020-06-30 DIAGNOSIS — Z20822 Contact with and (suspected) exposure to covid-19: Secondary | ICD-10-CM | POA: Diagnosis not present

## 2020-06-30 DIAGNOSIS — J441 Chronic obstructive pulmonary disease with (acute) exacerbation: Secondary | ICD-10-CM | POA: Diagnosis not present

## 2020-07-08 DIAGNOSIS — T691XXD Chilblains, subsequent encounter: Secondary | ICD-10-CM | POA: Diagnosis not present

## 2020-07-08 DIAGNOSIS — L853 Xerosis cutis: Secondary | ICD-10-CM | POA: Diagnosis not present

## 2020-07-08 DIAGNOSIS — Z85828 Personal history of other malignant neoplasm of skin: Secondary | ICD-10-CM | POA: Diagnosis not present

## 2020-07-13 DIAGNOSIS — M064 Inflammatory polyarthropathy: Secondary | ICD-10-CM | POA: Diagnosis not present

## 2020-07-13 DIAGNOSIS — M3501 Sicca syndrome with keratoconjunctivitis: Secondary | ICD-10-CM | POA: Diagnosis not present

## 2020-07-13 DIAGNOSIS — D72819 Decreased white blood cell count, unspecified: Secondary | ICD-10-CM | POA: Diagnosis not present

## 2020-07-13 DIAGNOSIS — M810 Age-related osteoporosis without current pathological fracture: Secondary | ICD-10-CM | POA: Diagnosis not present

## 2020-07-13 DIAGNOSIS — M3500 Sicca syndrome, unspecified: Secondary | ICD-10-CM | POA: Diagnosis not present

## 2020-07-13 DIAGNOSIS — Z79899 Other long term (current) drug therapy: Secondary | ICD-10-CM | POA: Diagnosis not present

## 2020-07-13 DIAGNOSIS — M150 Primary generalized (osteo)arthritis: Secondary | ICD-10-CM | POA: Diagnosis not present

## 2020-07-13 DIAGNOSIS — H3093 Unspecified chorioretinal inflammation, bilateral: Secondary | ICD-10-CM | POA: Diagnosis not present

## 2020-07-13 DIAGNOSIS — M5417 Radiculopathy, lumbosacral region: Secondary | ICD-10-CM | POA: Diagnosis not present

## 2020-07-27 DIAGNOSIS — R112 Nausea with vomiting, unspecified: Secondary | ICD-10-CM | POA: Diagnosis not present

## 2020-07-27 DIAGNOSIS — R197 Diarrhea, unspecified: Secondary | ICD-10-CM | POA: Diagnosis not present

## 2020-08-13 DIAGNOSIS — D1801 Hemangioma of skin and subcutaneous tissue: Secondary | ICD-10-CM | POA: Diagnosis not present

## 2020-08-13 DIAGNOSIS — D2262 Melanocytic nevi of left upper limb, including shoulder: Secondary | ICD-10-CM | POA: Diagnosis not present

## 2020-08-13 DIAGNOSIS — T691XXD Chilblains, subsequent encounter: Secondary | ICD-10-CM | POA: Diagnosis not present

## 2020-08-13 DIAGNOSIS — L814 Other melanin hyperpigmentation: Secondary | ICD-10-CM | POA: Diagnosis not present

## 2020-08-13 DIAGNOSIS — Z85828 Personal history of other malignant neoplasm of skin: Secondary | ICD-10-CM | POA: Diagnosis not present

## 2020-08-13 DIAGNOSIS — D692 Other nonthrombocytopenic purpura: Secondary | ICD-10-CM | POA: Diagnosis not present

## 2020-08-13 DIAGNOSIS — D225 Melanocytic nevi of trunk: Secondary | ICD-10-CM | POA: Diagnosis not present

## 2020-09-09 DIAGNOSIS — M810 Age-related osteoporosis without current pathological fracture: Secondary | ICD-10-CM | POA: Diagnosis not present

## 2020-09-14 DIAGNOSIS — M1612 Unilateral primary osteoarthritis, left hip: Secondary | ICD-10-CM | POA: Diagnosis not present

## 2020-09-17 DIAGNOSIS — M5136 Other intervertebral disc degeneration, lumbar region: Secondary | ICD-10-CM | POA: Diagnosis not present

## 2020-10-13 ENCOUNTER — Ambulatory Visit (INDEPENDENT_AMBULATORY_CARE_PROVIDER_SITE_OTHER): Payer: PPO | Admitting: Primary Care

## 2020-10-13 ENCOUNTER — Telehealth: Payer: Self-pay | Admitting: Adult Health

## 2020-10-13 ENCOUNTER — Telehealth: Payer: Self-pay | Admitting: Primary Care

## 2020-10-13 ENCOUNTER — Encounter: Payer: Self-pay | Admitting: Primary Care

## 2020-10-13 ENCOUNTER — Other Ambulatory Visit: Payer: Self-pay

## 2020-10-13 DIAGNOSIS — U071 COVID-19: Secondary | ICD-10-CM | POA: Diagnosis not present

## 2020-10-13 MED ORDER — NIRMATRELVIR/RITONAVIR (PAXLOVID)TABLET: 3.0000 | ORAL_TABLET | Freq: Two times a day (BID) | ORAL | 0 refills | Status: AC

## 2020-10-13 NOTE — Telephone Encounter (Signed)
Yes just sent

## 2020-10-13 NOTE — Telephone Encounter (Signed)
Phone visit scheduled for 10/13/2020 at 4:30.

## 2020-10-13 NOTE — Telephone Encounter (Signed)
BW pt wanted to check to see if the antibody for covid was going to be sent over to her pharmacy.  Please advise. Thanks

## 2020-10-13 NOTE — Patient Instructions (Addendum)
-   Continue Symbicort 160 two puffs twice daily  - Recommend patient use Albuterol nebulizer twice daily scheduled for the next 5-7 days - Sending in RX Paxlovid per pack instructions (normal kidney function, GFR >60 in August 2021) - Get pulse oximeter to monitor O2 level, if <88-90% needs ED evaluation  - Notify our office if you develop worsening respiratory symptoms

## 2020-10-13 NOTE — Progress Notes (Signed)
Virtual Visit via Telephone Note  I connected with Toni Myers on 10/13/20 at  4:30 PM EDT by telephone and verified that I am speaking with the correct person using two identifiers.  Location: Patient: Home Provider: Office    I discussed the limitations, risks, security and privacy concerns of performing an evaluation and management service by telephone and the availability of in person appointments. I also discussed with the patient that there may be a patient responsible charge related to this service. The patient expressed understanding and agreed to proceed.   History of Present Illness: 70 year old female. PMH significant for sjogren's on methotrexate and COPD/asthma. Patient of Dr. Lamonte Sakai, last seen in October 2020.  10/13/2020 - Interim hx  Patient tested positive for covid 91 yesterday. She traveled over the weekend to [place] [facility] for high school graduation. She has had cold like symptoms for the last 3-4 days. She is experiencing voice hoarseness, nasal congestion and chest tightness. She has no known history of kidney disease, GFR in August 2021 >60. She is fully vaccinated.     Observations/Objective:  - Audible nasal congestion; no overt shortness of breath or wheezing   Assessment and Plan:  Covid-19 - Dx 10/12/20, recent travel to [place] for high-school graduation  - She is experiencing mild cold like symptoms and is at risk of progression d/t co-morbidities - RX Paxlovid per pack instructions (normal kidney function, GFR >60 in August 2021) - Advised patient get pulse oximeter to monitor O2 level, if <88-90% needs ED evaluation  - Notify our office if she develop worsening respiratory symptoms   COPD/asthma - Continue Symbicort 160 two puffs twice daily  - Recommend patient use Albuterol nebulizer twice daily scheduled for the next 5-7 days  Follow Up Instructions:  - She has a fu scheduled with PCP next Thursday June 2nd / faxing note to PCP Dr. Newman Pies  with [facility]    I discussed the assessment and treatment plan with the patient. The patient was provided an opportunity to ask questions and all were answered. The patient agreed with the plan and demonstrated an understanding of the instructions.   The patient was advised to call back or seek an in-person evaluation if the symptoms worsen or if the condition fails to improve as anticipated.  I provided 22 minutes of non-face-to-face time during this encounter.   Martyn Ehrich, NP

## 2020-10-13 NOTE — Telephone Encounter (Signed)
Patient called in after hours. Traveled over the weekend . Tested positive today for Covid 19 infection .  Cold like symptoms only . No fever. Day 3-4 of symtoms  Fully vaccinated x 3 .  Hx of asthma .  Last seen  In office 02/2019, hx of COPD and Asthma .  Hx of Sjogrens -   Recommended supportive care for symptom management .  Continue on current maintenance regimen .   Triage please call patient and see if she would like a telemedicine visit today for further evaluation to see if needs antiviral rx or other tx .   Please contact office for sooner follow up if symptoms do not improve or worsen or seek emergency care

## 2020-10-13 NOTE — Telephone Encounter (Signed)
ATC pt on both numbers. Mobile number Vm box is full. Home number does not accepted restricted phone numbers. WCB.

## 2020-10-13 NOTE — Telephone Encounter (Signed)
Called and spoke with pt and she is aware/

## 2020-10-13 NOTE — Telephone Encounter (Signed)
ATC mobile number, unable to leave vm due to mailbox being full,. Left message on home number listed.   Please arrange telephone visit with NP for today when patient returns call

## 2020-10-20 ENCOUNTER — Telehealth: Payer: Self-pay | Admitting: Primary Care

## 2020-10-20 NOTE — Telephone Encounter (Signed)
End isolation after 5 full days if she is fever-free for 24 hours (without the use of fever-reducing medication) and her symptoms are improving. Continue to use caution for 10 days following positive test/wear mask.

## 2020-10-20 NOTE — Telephone Encounter (Signed)
Called and spoke with pt letting her know the recs stated by [facility] and she verbalized understanding. Nothing further needed.

## 2020-10-20 NOTE — Telephone Encounter (Signed)
Primary Pulmonologist: Dr. Lamonte Sakai Last office visit and with whom: 10/13/20 with Derl Barrow What do we see them for (pulmonary problems): COPD, chronic rhinitis, Sjogren's Syndrome Last OV assessment/plan: see below  Was appointment offered to patient (explain)?     Covid-19 - Dx 10/12/20, recent travel to [place] for high-school graduation  - She is experiencing mild cold like symptoms and is at risk of progression d/t co-morbidities - RX Paxlovid per pack instructions (normal kidney function, GFR >60 in August 2021) - Advised patient get pulse oximeter to monitor O2 level, if <88-90% needs ED evaluation  - Notify our office if she develop worsening respiratory symptoms   COPD/asthma - Continue Symbicort 160 two puffs twice daily  - Recommend patient use Albuterol nebulizer twice daily scheduled for the next 5-7 days     Reason for call: patient called to let us know she took a covid test today and it is positive. She had a televisit with beth on 10/13/2020 and was given antibodies and has finished it. Patient stated she is feeling better, tires out quickly is the only complaint. She was told to re-test again in 8 days and quarantine until then. She just wanted Beth to know and see if Eustaquio Maize has anymore recs. Will route to Tammy as Eustaquio Maize is off today.  Tammy, please advise. Thanks!  (examples of things to ask: : When did symptoms start? Fever? Cough? Productive? Color to sputum? More sputum than usual? Wheezing? Have you needed increased oxygen? Are you taking your respiratory medications? What over the counter measures have you tried?)  Allergies  Allergen Reactions  . Morphine And Related Nausea And Vomiting    "makes me sick at high doses"; doesn't like to take opioids  . Prednisone Other (See Comments)    Causes insomnia by the 3rd day of taking.prednisone taper.    Immunization History  Administered Date(s) Administered  . Influenza Split 02/27/2012, 02/20/2014, 01/29/2015   . Influenza Whole 02/21/2011  . Influenza, High Dose Seasonal PF 07/12/2020  . Influenza,inj,quad, With Preservative 03/19/2019  . Moderna Sars-Covid-2 Vaccination 06/25/2019, 07/23/2019, 03/21/2020  . Pneumococcal-Unspecified 06/29/2009  . Tdap 10/27/2015

## 2020-10-20 NOTE — Telephone Encounter (Signed)
Seen by Volanda Napoleon last week

## 2020-10-22 DIAGNOSIS — Z01818 Encounter for other preprocedural examination: Secondary | ICD-10-CM | POA: Diagnosis not present

## 2020-10-22 DIAGNOSIS — J984 Other disorders of lung: Secondary | ICD-10-CM | POA: Diagnosis not present

## 2020-10-22 DIAGNOSIS — J439 Emphysema, unspecified: Secondary | ICD-10-CM | POA: Diagnosis not present

## 2020-10-22 DIAGNOSIS — Z9181 History of falling: Secondary | ICD-10-CM | POA: Diagnosis not present

## 2020-10-22 DIAGNOSIS — G2581 Restless legs syndrome: Secondary | ICD-10-CM | POA: Diagnosis not present

## 2020-10-22 DIAGNOSIS — Z79899 Other long term (current) drug therapy: Secondary | ICD-10-CM | POA: Diagnosis not present

## 2020-10-22 DIAGNOSIS — Z6822 Body mass index (BMI) 22.0-22.9, adult: Secondary | ICD-10-CM | POA: Diagnosis not present

## 2020-10-22 DIAGNOSIS — Z8616 Personal history of COVID-19: Secondary | ICD-10-CM | POA: Diagnosis not present

## 2020-10-22 DIAGNOSIS — R000 Tachycardia, unspecified: Secondary | ICD-10-CM | POA: Diagnosis not present

## 2020-10-22 DIAGNOSIS — M1612 Unilateral primary osteoarthritis, left hip: Secondary | ICD-10-CM | POA: Diagnosis not present

## 2020-10-22 DIAGNOSIS — I10 Essential (primary) hypertension: Secondary | ICD-10-CM | POA: Diagnosis not present

## 2020-10-22 DIAGNOSIS — M3500 Sicca syndrome, unspecified: Secondary | ICD-10-CM | POA: Diagnosis not present

## 2020-10-23 ENCOUNTER — Telehealth: Payer: Self-pay | Admitting: Primary Care

## 2020-10-23 NOTE — Telephone Encounter (Signed)
Left message for patient to call back  

## 2020-10-28 NOTE — Telephone Encounter (Signed)
lmtcb for pt.   Unable to see what pharmacy the albuterol neb is at as there hasnt been a recent script sent. Will need to speak with pharmacy to see if PA is needed and what the cost would be if out of pocket. Typically Albuterol neb isnt expensive when it isnt ran [employer].

## 2020-10-29 DIAGNOSIS — M1612 Unilateral primary osteoarthritis, left hip: Secondary | ICD-10-CM | POA: Diagnosis not present

## 2020-10-29 NOTE — Telephone Encounter (Signed)
Lmtcb for pt.  Due to several unsuccessful attempts to reach pt message will be closed per triage protocol.

## 2020-11-04 DIAGNOSIS — Z6822 Body mass index (BMI) 22.0-22.9, adult: Secondary | ICD-10-CM | POA: Diagnosis not present

## 2020-11-04 DIAGNOSIS — Z01818 Encounter for other preprocedural examination: Secondary | ICD-10-CM | POA: Diagnosis not present

## 2020-11-04 DIAGNOSIS — M1612 Unilateral primary osteoarthritis, left hip: Secondary | ICD-10-CM | POA: Diagnosis not present

## 2020-11-04 DIAGNOSIS — Z8616 Personal history of COVID-19: Secondary | ICD-10-CM | POA: Diagnosis not present

## 2020-11-04 DIAGNOSIS — Z20822 Contact with and (suspected) exposure to covid-19: Secondary | ICD-10-CM | POA: Diagnosis not present

## 2020-11-10 DIAGNOSIS — M1612 Unilateral primary osteoarthritis, left hip: Secondary | ICD-10-CM | POA: Diagnosis not present

## 2020-12-10 DIAGNOSIS — J029 Acute pharyngitis, unspecified: Secondary | ICD-10-CM | POA: Diagnosis not present

## 2020-12-10 DIAGNOSIS — Z6822 Body mass index (BMI) 22.0-22.9, adult: Secondary | ICD-10-CM | POA: Diagnosis not present

## 2020-12-10 DIAGNOSIS — Z20822 Contact with and (suspected) exposure to covid-19: Secondary | ICD-10-CM | POA: Diagnosis not present

## 2020-12-15 DIAGNOSIS — Z471 Aftercare following joint replacement surgery: Secondary | ICD-10-CM | POA: Diagnosis not present

## 2020-12-15 DIAGNOSIS — Z96642 Presence of left artificial hip joint: Secondary | ICD-10-CM | POA: Diagnosis not present

## 2021-01-04 DIAGNOSIS — M25559 Pain in unspecified hip: Secondary | ICD-10-CM | POA: Diagnosis not present

## 2021-01-08 DIAGNOSIS — M25559 Pain in unspecified hip: Secondary | ICD-10-CM | POA: Diagnosis not present

## 2021-01-11 DIAGNOSIS — F32A Depression, unspecified: Secondary | ICD-10-CM | POA: Diagnosis not present

## 2021-01-11 DIAGNOSIS — Z6821 Body mass index (BMI) 21.0-21.9, adult: Secondary | ICD-10-CM | POA: Diagnosis not present

## 2021-01-11 DIAGNOSIS — M25559 Pain in unspecified hip: Secondary | ICD-10-CM | POA: Diagnosis not present

## 2021-01-11 DIAGNOSIS — R634 Abnormal weight loss: Secondary | ICD-10-CM | POA: Diagnosis not present

## 2021-01-11 DIAGNOSIS — J441 Chronic obstructive pulmonary disease with (acute) exacerbation: Secondary | ICD-10-CM | POA: Diagnosis not present

## 2021-01-14 DIAGNOSIS — M25559 Pain in unspecified hip: Secondary | ICD-10-CM | POA: Diagnosis not present

## 2021-01-19 DIAGNOSIS — M25559 Pain in unspecified hip: Secondary | ICD-10-CM | POA: Diagnosis not present

## 2021-01-22 DIAGNOSIS — M25559 Pain in unspecified hip: Secondary | ICD-10-CM | POA: Diagnosis not present

## 2021-01-26 DIAGNOSIS — M25559 Pain in unspecified hip: Secondary | ICD-10-CM | POA: Diagnosis not present

## 2021-01-29 DIAGNOSIS — M25552 Pain in left hip: Secondary | ICD-10-CM | POA: Diagnosis not present

## 2021-02-08 DIAGNOSIS — M25559 Pain in unspecified hip: Secondary | ICD-10-CM | POA: Diagnosis not present

## 2021-02-12 DIAGNOSIS — M25559 Pain in unspecified hip: Secondary | ICD-10-CM | POA: Diagnosis not present

## 2021-02-16 DIAGNOSIS — M25559 Pain in unspecified hip: Secondary | ICD-10-CM | POA: Diagnosis not present

## 2021-02-19 DIAGNOSIS — M25552 Pain in left hip: Secondary | ICD-10-CM | POA: Diagnosis not present

## 2021-03-18 DIAGNOSIS — Z79899 Other long term (current) drug therapy: Secondary | ICD-10-CM | POA: Diagnosis not present

## 2021-03-18 DIAGNOSIS — J449 Chronic obstructive pulmonary disease, unspecified: Secondary | ICD-10-CM | POA: Diagnosis not present

## 2021-03-18 DIAGNOSIS — Z139 Encounter for screening, unspecified: Secondary | ICD-10-CM | POA: Diagnosis not present

## 2021-03-18 DIAGNOSIS — F418 Other specified anxiety disorders: Secondary | ICD-10-CM | POA: Diagnosis not present

## 2021-03-18 DIAGNOSIS — Z6822 Body mass index (BMI) 22.0-22.9, adult: Secondary | ICD-10-CM | POA: Diagnosis not present

## 2021-03-18 DIAGNOSIS — I10 Essential (primary) hypertension: Secondary | ICD-10-CM | POA: Diagnosis not present

## 2021-03-18 DIAGNOSIS — Z23 Encounter for immunization: Secondary | ICD-10-CM | POA: Diagnosis not present

## 2021-03-30 DIAGNOSIS — W57XXXA Bitten or stung by nonvenomous insect and other nonvenomous arthropods, initial encounter: Secondary | ICD-10-CM | POA: Diagnosis not present

## 2021-03-30 DIAGNOSIS — Z85828 Personal history of other malignant neoplasm of skin: Secondary | ICD-10-CM | POA: Diagnosis not present

## 2021-04-01 DIAGNOSIS — J069 Acute upper respiratory infection, unspecified: Secondary | ICD-10-CM | POA: Diagnosis not present

## 2021-04-02 DIAGNOSIS — Z6821 Body mass index (BMI) 21.0-21.9, adult: Secondary | ICD-10-CM | POA: Diagnosis not present

## 2021-04-02 DIAGNOSIS — N390 Urinary tract infection, site not specified: Secondary | ICD-10-CM | POA: Diagnosis not present

## 2021-04-14 DIAGNOSIS — N390 Urinary tract infection, site not specified: Secondary | ICD-10-CM | POA: Diagnosis not present

## 2021-04-19 DIAGNOSIS — M5417 Radiculopathy, lumbosacral region: Secondary | ICD-10-CM | POA: Diagnosis not present

## 2021-04-19 DIAGNOSIS — D72819 Decreased white blood cell count, unspecified: Secondary | ICD-10-CM | POA: Diagnosis not present

## 2021-04-19 DIAGNOSIS — H3093 Unspecified chorioretinal inflammation, bilateral: Secondary | ICD-10-CM | POA: Diagnosis not present

## 2021-04-19 DIAGNOSIS — M150 Primary generalized (osteo)arthritis: Secondary | ICD-10-CM | POA: Diagnosis not present

## 2021-04-19 DIAGNOSIS — K219 Gastro-esophageal reflux disease without esophagitis: Secondary | ICD-10-CM | POA: Diagnosis not present

## 2021-04-19 DIAGNOSIS — Z79899 Other long term (current) drug therapy: Secondary | ICD-10-CM | POA: Diagnosis not present

## 2021-04-19 DIAGNOSIS — M810 Age-related osteoporosis without current pathological fracture: Secondary | ICD-10-CM | POA: Diagnosis not present

## 2021-04-19 DIAGNOSIS — M3500 Sicca syndrome, unspecified: Secondary | ICD-10-CM | POA: Diagnosis not present

## 2021-04-19 DIAGNOSIS — M064 Inflammatory polyarthropathy: Secondary | ICD-10-CM | POA: Diagnosis not present

## 2021-04-19 DIAGNOSIS — M3501 Sicca syndrome with keratoconjunctivitis: Secondary | ICD-10-CM | POA: Diagnosis not present

## 2021-05-05 DIAGNOSIS — J449 Chronic obstructive pulmonary disease, unspecified: Secondary | ICD-10-CM | POA: Diagnosis not present

## 2021-05-05 DIAGNOSIS — N390 Urinary tract infection, site not specified: Secondary | ICD-10-CM | POA: Diagnosis not present

## 2021-05-05 DIAGNOSIS — Z6822 Body mass index (BMI) 22.0-22.9, adult: Secondary | ICD-10-CM | POA: Diagnosis not present

## 2021-05-31 DIAGNOSIS — Z96642 Presence of left artificial hip joint: Secondary | ICD-10-CM | POA: Diagnosis not present

## 2021-06-13 ENCOUNTER — Encounter: Payer: Self-pay | Admitting: Cardiovascular Disease

## 2021-06-13 NOTE — Progress Notes (Signed)
Cardiology Office Note:    Date:  06/14/2021   ID:  Toni Myers, DOB 04-Jan-1951, MRN [id number]  PCP:  Cyndi Bender, PA-C  Cardiologist:  Mertie Moores, MD  Electrophysiologist:  None   Referring MD: Cyndi Bender, PA-C   Chief Complaint  Patient presents with   sinus tachycardia   Tachycardia     Oct. 31, 2019   Toni Myers is a 71 y.o. female with a hx of  COPD, HTN, Lupus,   She has a history of hypertension.  She also has a history of sinus tachycardia.  We are asked to see her today by Toni Roe, PA for further evaluation of her hypertension and sinus tachycardia.  Toni Myers")  is seen back today . I've seen her in the remote past - for ? HTN She has COPD and has never smoked.   Had lots of 2nd hand smoke  She has had  Tachycardia  Was started on Atenolol and her tachycardia is better.   Seen Dr. Lamonte Sakai for her COPD .   Has been losing weight . - 20 lbs  Has lots of fatigue   Jan. 31, 2020:  Toni Myers is seen back today for follow-up visit for her sinus tachycardia.  She has a history of COPD ( lifelong nonsmoker)  and hypertension. Has never smoked.   Has had COPD for years - she is not sure if she has alpha 1 antitrypsin deficiency Is doing well No caridac issues  Nov. 4, 2021:  Toni Myers is seen today for follow up of her snius tachycardia. Has COPD, has never smoked She was hospitalized with appendicitis in Aug. 2021 Had hip replacement ([facility]) , is doing well .  Jan. 23, 2023 Toni Myers is seen today for follow up of her sinus tachycardia and HTN Takes care of her 73 yo granddaugher  Exercises regularly  Had emergent appendectomy this past year  Past Medical History:  Diagnosis Date   Anxiety    Arthritis    Rheumatoid   Asthma    Chronic iritis, left eye    Chronic low back pain    CKD (chronic kidney disease), stage II 10/08/2015   Closed fracture of multiple pubic rami ([place]) 07/19/2013   Closed fracture of pelvic rim 07/19/2013   Cough     Depression    Dyspnea    Dysrhythmia    resolved   GERD (gastroesophageal reflux disease)    Hypertension    Migraine    none in several years   Palpitations    RLS (restless legs syndrome)    Shingles    Sjogrens syndrome ([place])     Past Surgical History:  Procedure Laterality Date   BACK SURGERY     07/1998 and 03/1999, herniated disc, 2 rod and 4 screws   COLONOSCOPY WITH PROPOFOL N/A 02/09/2015   Procedure: COLONOSCOPY WITH PROPOFOL;  Surgeon: Garlan Fair, MD;  Location: [facility];  Service: Endoscopy;  Laterality: N/A;   ESOPHAGOGASTRODUODENOSCOPY (EGD) WITH PROPOFOL N/A 02/09/2015   Procedure: ESOPHAGOGASTRODUODENOSCOPY (EGD) WITH PROPOFOL;  Surgeon: Garlan Fair, MD;  Location: [facility];  Service: Endoscopy;  Laterality: N/A;   EYE SURGERY Bilateral 2020   LAPAROSCOPIC APPENDECTOMY N/A 01/04/2020   Procedure: APPENDECTOMY LAPAROSCOPIC;  Surgeon: Michael Boston, MD;  Location: [facility];  Service: General;  Laterality: N/A;   TOTAL HIP ARTHROPLASTY Right 11/13/2019   Procedure: TOTAL HIP ARTHROPLASTY ANTERIOR APPROACH;  Surgeon: Gaynelle Arabian, MD;  Location: [facility];  Service: Orthopedics;  Laterality: Right;  120min   TUBAL LIGATION      Current Medications: Current Meds  Medication Sig   albuterol (PROVENTIL) (2.5 MG/3ML) 0.083% nebulizer solution Take 2.5 mg by nebulization every 4 (four) hours as needed for wheezing or shortness of breath. Reported on 10/22/2015   albuterol (VENTOLIN HFA) 108 (90 Base) MCG/ACT inhaler Inhale 2 puffs into the lungs every 6 (six) hours as needed for wheezing or shortness of breath.    budesonide-formoterol (SYMBICORT) 160-4.5 MCG/ACT inhaler Inhale 2 puffs into the lungs 2 (two) times daily.   celecoxib (CELEBREX) 200 MG capsule Take 200 mg by mouth daily.   escitalopram (LEXAPRO) 10 MG tablet Take 10 mg by mouth daily.   folic acid (FOLVITE) 1 MG tablet Take 3 mg by mouth daily.    gabapentin (NEURONTIN) 300 MG capsule Take 300-600  mg by mouth See admin instructions. Take 1 capsule (300 mg) by mouth twice daily & take 2 capsules (600 mg) by mouth at bedtime.   metoprolol succinate (TOPROL-XL) 50 MG 24 hr tablet Take 1 tablet (50 mg total) by mouth daily. Take with or immediately following a meal.   Multiple Vitamin (MULTIVITAMIN) tablet Take 1 tablet by mouth daily.   [DISCONTINUED] metoprolol succinate (TOPROL-XL) 25 MG 24 hr tablet Take 1 tablet (25 mg total) by mouth daily.     Allergies:   Morphine and related and Prednisone   Social History   Socioeconomic History   Marital status: Divorced    Spouse name: Not on file   Number of children: Not on file   Years of education: Not on file   Highest education level: Not on file  Occupational History   Occupation: retired  Tobacco Use   Smoking status: Never   Smokeless tobacco: Never  Vaping Use   Vaping Use: Never used  Substance and Sexual Activity   Alcohol use: Yes    Comment: 2 glasses monthly   Drug use: No   Sexual activity: Not on file  Other Topics Concern   Not on file  Social History Narrative   Not on file   Social Determinants of Health   Financial Resource Strain: Not on file  Food Insecurity: Not on file  Transportation Needs: Not on file  Physical Activity: Not on file  Stress: Not on file  Social Connections: Not on file     Family History: The patient's family history includes Arthritis in her mother.  ROS:   Please see the history of present illness.     All other systems reviewed and are negative.  EKGs/Labs/Other Studies Reviewed:    The following studies were reviewed today:    Recent Labs: No results found for requested labs within last 8760 hours.  Recent Lipid Panel No results found for: CHOL, TRIG, HDL, CHOLHDL, VLDL, LDLCALC, LDLDIRECT    Physical Exam: Blood pressure 134/80, pulse 97, height 5\' 4"  (1.626 m), weight 118 lb (53.5 kg), SpO2 97 %.  GEN:  Well nourished, well developed in no acute  distress HEENT: Normal NECK: No JVD; No carotid bruits LYMPHATICS: No lymphadenopathy CARDIAC: RRR   RESPIRATORY:  Clear to auscultation without rales, wheezing or rhonchi  ABDOMEN: Soft, non-tender, non-distended MUSCULOSKELETAL:  No edema; No deformity  SKIN: Warm and dry NEUROLOGIC:  Alert and oriented x 3   EKG:   Jan. 23, 2023:  NSR , inc. RBBB     ASSESSMENT:    1. Sinus tachycardia     PLAN:  Sinus tachycardia:  .  HR remains elevated.  Will increase the toprol XL to 50.  Check BMP , TSH today    3.  COPD :   She has never smoked.   Was around lots of second hand smoke from her ex-husband.     Medication Adjustments/Labs and Tests Ordered: Current medicines are reviewed at length with the patient today.  Concerns regarding medicines are outlined above.  Orders Placed This Encounter  Procedures   TSH   Basic Metabolic Panel (BMET)   EKG 12-Lead   Meds ordered this encounter  Medications   metoprolol succinate (TOPROL-XL) 50 MG 24 hr tablet    Sig: Take 1 tablet (50 mg total) by mouth daily. Take with or immediately following a meal.    Dispense:  90 tablet    Refill:  3     Patient Instructions  Medication Instructions:  Your physician has recommended you make the following change in your medication:   INCREASE the Toprol to 50 mg taking 1 daily.  We have sent in a new [facility] to [facility].   *If you need a refill on your cardiac medications before your next appointment, please call your pharmacy*   Lab Work: TODAY:  BMET  If you have labs (blood work) drawn today and your tests are completely normal, you will receive your results only by: [place] (if you have MyChart) OR A paper copy in the mail If you have any lab test that is abnormal or we need to change your treatment, we will call you to review the results.   Testing/Procedures: None ordered   Follow-Up: At [facility], you and your health needs are our priority.  As  part of our continuing mission to provide you with exceptional heart care, we have created designated Provider Care Teams.  These Care Teams include your primary Cardiologist (physician) and Advanced Practice Providers (APPs -  Physician Assistants and Nurse Practitioners) who all work together to provide you with the care you need, when you need it.  We recommend signing up for the patient portal called "MyChart".  Sign up information is provided on this After Visit Summary.  MyChart is used to connect with patients for Virtual Visits (Telemedicine).  Patients are able to view lab/test results, encounter notes, upcoming appointments, etc.  Non-urgent messages can be sent to your provider as well.   To learn more about what you can do with MyChart, go to [email or web address].    Your next appointment:   12 month(s)  The format for your next appointment:   In Person  Provider:   Mertie Moores, MD  or Robbie Lis, PA-C, Christen Bame, NP, or Richardson Dopp, PA-C        Other Instructions    Signed, Mertie Moores, MD  06/14/2021 10:15 AM    [place]

## 2021-06-14 ENCOUNTER — Other Ambulatory Visit: Payer: Self-pay

## 2021-06-14 ENCOUNTER — Encounter: Payer: Self-pay | Admitting: Cardiovascular Disease

## 2021-06-14 ENCOUNTER — Ambulatory Visit: Payer: PPO | Admitting: Cardiovascular Disease

## 2021-06-14 VITALS — BP 134/80 | HR 97 | Ht 64.0 in | Wt 118.0 lb

## 2021-06-14 DIAGNOSIS — R000 Tachycardia, unspecified: Secondary | ICD-10-CM | POA: Diagnosis not present

## 2021-06-14 LAB — BASIC METABOLIC PANEL
BUN/Creatinine Ratio: 14 (ref 12–28)
BUN: 11 mg/dL (ref 8–27)
CO2: 25 mmol/L (ref 20–29)
Calcium: 9.8 mg/dL (ref 8.7–10.3)
Chloride: 99 mmol/L (ref 96–106)
Creatinine, Ser: 0.76 mg/dL (ref 0.57–1.00)
Glucose: 95 mg/dL (ref 70–99)
Potassium: 5 mmol/L (ref 3.5–5.2)
Sodium: 139 mmol/L (ref 134–144)
eGFR: 83 mL/min/{1.73_m2} (ref >59)

## 2021-06-14 LAB — TSH: TSH: 0.995 u[IU]/mL (ref 0.450–4.500)

## 2021-06-14 MED ORDER — METOPROLOL SUCCINATE ER 50 MG PO TB24: 50.0000 mg | ORAL_TABLET | Freq: Every day | ORAL | 3 refills | Status: DC

## 2021-06-14 NOTE — Patient Instructions (Addendum)
Medication Instructions:  Your physician has recommended you make the following change in your medication:   INCREASE the Toprol to 50 mg taking 1 daily.  We [facility]   *If you need a refill on your cardiac medications before your next appointment, please call your pharmacy*   Lab Work: TODAY:  BMET  If you have labs (blood work) drawn today and your tests are completely normal, you will receive your results only by: [place] (if you have MyChart) OR A paper copy in the mail If you have any lab test that is abnormal or we need to change your treatment, we will call you to review the results.   Testing/Procedures: None ordered   Follow-Up: At [facility], you and your health needs are our priority.  As part of our continuing mission to provide you with exceptional heart care, we have created designated Provider Care Teams.  These Care Teams include your primary Cardiologist (physician) and Advanced Practice Providers (APPs -  Physician Assistants and Nurse Practitioners) who all work together to provide you with the care you need, when you need it.  We recommend signing up for the patient portal called "MyChart".  Sign up information is provided on this After Visit Summary.  MyChart is used to connect with patients for Virtual Visits (Telemedicine).  Patients are able to view lab/test results, encounter notes, upcoming appointments, etc.  Non-urgent messages can be sent to your provider as well.   To learn more about what you can do with MyChart, go to [email or web address].    Your next appointment:   12 month(s)  The format for your next appointment:   In Person  Provider:   Mertie Moores, MD  or Robbie Lis, PA-C, Christen Bame, NP, or Richardson Dopp, PA-C        Other Instructions

## 2021-06-30 DIAGNOSIS — D2271 Melanocytic nevi of right lower limb, including hip: Secondary | ICD-10-CM | POA: Diagnosis not present

## 2021-06-30 DIAGNOSIS — Z79899 Other long term (current) drug therapy: Secondary | ICD-10-CM | POA: Diagnosis not present

## 2021-06-30 DIAGNOSIS — T691XXA Chilblains, initial encounter: Secondary | ICD-10-CM | POA: Diagnosis not present

## 2021-06-30 DIAGNOSIS — D2262 Melanocytic nevi of left upper limb, including shoulder: Secondary | ICD-10-CM | POA: Diagnosis not present

## 2021-06-30 DIAGNOSIS — D225 Melanocytic nevi of trunk: Secondary | ICD-10-CM | POA: Diagnosis not present

## 2021-06-30 DIAGNOSIS — Z85828 Personal history of other malignant neoplasm of skin: Secondary | ICD-10-CM | POA: Diagnosis not present

## 2021-06-30 DIAGNOSIS — D1801 Hemangioma of skin and subcutaneous tissue: Secondary | ICD-10-CM | POA: Diagnosis not present

## 2021-06-30 DIAGNOSIS — M349 Systemic sclerosis, unspecified: Secondary | ICD-10-CM | POA: Diagnosis not present

## 2021-06-30 DIAGNOSIS — L814 Other melanin hyperpigmentation: Secondary | ICD-10-CM | POA: Diagnosis not present

## 2021-07-29 DIAGNOSIS — M810 Age-related osteoporosis without current pathological fracture: Secondary | ICD-10-CM | POA: Diagnosis not present

## 2021-07-29 DIAGNOSIS — M150 Primary generalized (osteo)arthritis: Secondary | ICD-10-CM | POA: Diagnosis not present

## 2021-07-29 DIAGNOSIS — H3093 Unspecified chorioretinal inflammation, bilateral: Secondary | ICD-10-CM | POA: Diagnosis not present

## 2021-07-29 DIAGNOSIS — K219 Gastro-esophageal reflux disease without esophagitis: Secondary | ICD-10-CM | POA: Diagnosis not present

## 2021-07-29 DIAGNOSIS — I7300 Raynaud's syndrome without gangrene: Secondary | ICD-10-CM | POA: Diagnosis not present

## 2021-07-29 DIAGNOSIS — M25561 Pain in right knee: Secondary | ICD-10-CM | POA: Diagnosis not present

## 2021-07-29 DIAGNOSIS — D72819 Decreased white blood cell count, unspecified: Secondary | ICD-10-CM | POA: Diagnosis not present

## 2021-07-29 DIAGNOSIS — M5417 Radiculopathy, lumbosacral region: Secondary | ICD-10-CM | POA: Diagnosis not present

## 2021-07-29 DIAGNOSIS — R768 Other specified abnormal immunological findings in serum: Secondary | ICD-10-CM | POA: Diagnosis not present

## 2021-07-29 DIAGNOSIS — Z79899 Other long term (current) drug therapy: Secondary | ICD-10-CM | POA: Diagnosis not present

## 2021-07-29 DIAGNOSIS — M3500 Sicca syndrome, unspecified: Secondary | ICD-10-CM | POA: Diagnosis not present

## 2021-07-29 DIAGNOSIS — M25551 Pain in right hip: Secondary | ICD-10-CM | POA: Diagnosis not present

## 2021-07-29 DIAGNOSIS — M3501 Sicca syndrome with keratoconjunctivitis: Secondary | ICD-10-CM | POA: Diagnosis not present

## 2021-07-29 DIAGNOSIS — M25562 Pain in left knee: Secondary | ICD-10-CM | POA: Diagnosis not present

## 2021-07-29 DIAGNOSIS — M064 Inflammatory polyarthropathy: Secondary | ICD-10-CM | POA: Diagnosis not present

## 2021-09-01 DIAGNOSIS — M150 Primary generalized (osteo)arthritis: Secondary | ICD-10-CM | POA: Diagnosis not present

## 2021-09-01 DIAGNOSIS — M79641 Pain in right hand: Secondary | ICD-10-CM | POA: Diagnosis not present

## 2021-09-01 DIAGNOSIS — Z79899 Other long term (current) drug therapy: Secondary | ICD-10-CM | POA: Diagnosis not present

## 2021-09-01 DIAGNOSIS — M810 Age-related osteoporosis without current pathological fracture: Secondary | ICD-10-CM | POA: Diagnosis not present

## 2021-09-01 DIAGNOSIS — I7300 Raynaud's syndrome without gangrene: Secondary | ICD-10-CM | POA: Diagnosis not present

## 2021-09-01 DIAGNOSIS — M5417 Radiculopathy, lumbosacral region: Secondary | ICD-10-CM | POA: Diagnosis not present

## 2021-09-01 DIAGNOSIS — M064 Inflammatory polyarthropathy: Secondary | ICD-10-CM | POA: Diagnosis not present

## 2021-09-01 DIAGNOSIS — K219 Gastro-esophageal reflux disease without esophagitis: Secondary | ICD-10-CM | POA: Diagnosis not present

## 2021-09-01 DIAGNOSIS — H3093 Unspecified chorioretinal inflammation, bilateral: Secondary | ICD-10-CM | POA: Diagnosis not present

## 2021-09-01 DIAGNOSIS — M79644 Pain in right finger(s): Secondary | ICD-10-CM | POA: Diagnosis not present

## 2021-09-01 DIAGNOSIS — M3500 Sicca syndrome, unspecified: Secondary | ICD-10-CM | POA: Diagnosis not present

## 2021-09-09 DIAGNOSIS — M810 Age-related osteoporosis without current pathological fracture: Secondary | ICD-10-CM | POA: Diagnosis not present

## 2021-09-15 DIAGNOSIS — I7300 Raynaud's syndrome without gangrene: Secondary | ICD-10-CM | POA: Diagnosis not present

## 2021-09-15 DIAGNOSIS — Z85828 Personal history of other malignant neoplasm of skin: Secondary | ICD-10-CM | POA: Diagnosis not present

## 2021-09-20 DIAGNOSIS — F418 Other specified anxiety disorders: Secondary | ICD-10-CM | POA: Diagnosis not present

## 2021-09-20 DIAGNOSIS — Z79899 Other long term (current) drug therapy: Secondary | ICD-10-CM | POA: Diagnosis not present

## 2021-09-20 DIAGNOSIS — J449 Chronic obstructive pulmonary disease, unspecified: Secondary | ICD-10-CM | POA: Diagnosis not present

## 2021-09-20 DIAGNOSIS — I10 Essential (primary) hypertension: Secondary | ICD-10-CM | POA: Diagnosis not present

## 2021-09-20 DIAGNOSIS — R000 Tachycardia, unspecified: Secondary | ICD-10-CM | POA: Diagnosis not present

## 2021-09-27 DIAGNOSIS — E875 Hyperkalemia: Secondary | ICD-10-CM | POA: Diagnosis not present

## 2021-10-14 ENCOUNTER — Other Ambulatory Visit: Payer: Self-pay | Admitting: Physician Assistant

## 2021-10-14 ENCOUNTER — Ambulatory Visit
Admission: RE | Admit: 2021-10-14 | Discharge: 2021-10-14 | Disposition: A | Discharge status: Home / Self Care | Payer: PPO | Source: Ambulatory Visit | Attending: Physician Assistant | Admitting: Physician Assistant

## 2021-10-14 DIAGNOSIS — M25552 Pain in left hip: Secondary | ICD-10-CM

## 2021-10-14 DIAGNOSIS — M545 Low back pain, unspecified: Secondary | ICD-10-CM

## 2021-10-14 DIAGNOSIS — M25551 Pain in right hip: Secondary | ICD-10-CM | POA: Diagnosis not present

## 2021-10-20 DIAGNOSIS — J441 Chronic obstructive pulmonary disease with (acute) exacerbation: Secondary | ICD-10-CM | POA: Diagnosis not present

## 2021-10-20 DIAGNOSIS — Z20822 Contact with and (suspected) exposure to covid-19: Secondary | ICD-10-CM | POA: Diagnosis not present

## 2021-10-25 DIAGNOSIS — E875 Hyperkalemia: Secondary | ICD-10-CM | POA: Diagnosis not present

## 2021-12-02 DIAGNOSIS — Z0000 Encounter for general adult medical examination without abnormal findings: Secondary | ICD-10-CM | POA: Diagnosis not present

## 2021-12-02 DIAGNOSIS — E785 Hyperlipidemia, unspecified: Secondary | ICD-10-CM | POA: Diagnosis not present

## 2021-12-02 DIAGNOSIS — Z1331 Encounter for screening for depression: Secondary | ICD-10-CM | POA: Diagnosis not present

## 2022-01-13 ENCOUNTER — Other Ambulatory Visit: Payer: Self-pay

## 2022-01-13 NOTE — Patient Outreach (Signed)
  Care Coordination   outreach  Visit Note   01/13/2022 Name: JANIN KOZLOWSKI MRN: [id number] DOB: [birth year]  TALEYA WHITCHER is a 71 y.o. year old female who sees Cyndi Bender, [place] for primary care. I spoke with  Jaclyn Prime by phone today  What matters to the patients health and wellness today?  Placed call to patient to reviewed [facility] care coordination program.  Patient reports she is self managing well.Declines needs at this time. Encouraged patient to talk to MD if she changes her mind. She agreed.       SDOH assessments and interventions completed:  No     Care Coordination Interventions Activated:  No  Care Coordination Interventions:  No, not indicated   Follow up plan: No further intervention required.   Encounter Outcome:  Pt. Refused   Tomasa Rand, RN, BSN, CEN [facility] [employer] [phone]

## 2022-01-14 DIAGNOSIS — L308 Other specified dermatitis: Secondary | ICD-10-CM | POA: Diagnosis not present

## 2022-01-14 DIAGNOSIS — Z85828 Personal history of other malignant neoplasm of skin: Secondary | ICD-10-CM | POA: Diagnosis not present

## 2022-01-14 DIAGNOSIS — D485 Neoplasm of uncertain behavior of skin: Secondary | ICD-10-CM | POA: Diagnosis not present

## 2022-01-14 DIAGNOSIS — L309 Dermatitis, unspecified: Secondary | ICD-10-CM | POA: Diagnosis not present

## 2022-01-17 DIAGNOSIS — Z6822 Body mass index (BMI) 22.0-22.9, adult: Secondary | ICD-10-CM | POA: Diagnosis not present

## 2022-01-17 DIAGNOSIS — R5383 Other fatigue: Secondary | ICD-10-CM | POA: Diagnosis not present

## 2022-01-17 DIAGNOSIS — M5136 Other intervertebral disc degeneration, lumbar region: Secondary | ICD-10-CM | POA: Diagnosis not present

## 2022-01-17 DIAGNOSIS — I7300 Raynaud's syndrome without gangrene: Secondary | ICD-10-CM | POA: Diagnosis not present

## 2022-01-17 DIAGNOSIS — R768 Other specified abnormal immunological findings in serum: Secondary | ICD-10-CM | POA: Diagnosis not present

## 2022-01-17 DIAGNOSIS — M7989 Other specified soft tissue disorders: Secondary | ICD-10-CM | POA: Diagnosis not present

## 2022-01-17 DIAGNOSIS — M0609 Rheumatoid arthritis without rheumatoid factor, multiple sites: Secondary | ICD-10-CM | POA: Diagnosis not present

## 2022-01-17 DIAGNOSIS — M1991 Primary osteoarthritis, unspecified site: Secondary | ICD-10-CM | POA: Diagnosis not present

## 2022-01-29 DIAGNOSIS — Z20822 Contact with and (suspected) exposure to covid-19: Secondary | ICD-10-CM | POA: Diagnosis not present

## 2022-01-29 DIAGNOSIS — J449 Chronic obstructive pulmonary disease, unspecified: Secondary | ICD-10-CM | POA: Diagnosis not present

## 2022-01-29 DIAGNOSIS — R059 Cough, unspecified: Secondary | ICD-10-CM | POA: Diagnosis not present

## 2022-02-18 DIAGNOSIS — M545 Low back pain, unspecified: Secondary | ICD-10-CM | POA: Diagnosis not present

## 2022-02-18 DIAGNOSIS — Z96643 Presence of artificial hip joint, bilateral: Secondary | ICD-10-CM | POA: Diagnosis not present

## 2022-03-22 DIAGNOSIS — M545 Low back pain, unspecified: Secondary | ICD-10-CM | POA: Diagnosis not present

## 2022-03-22 DIAGNOSIS — Z96641 Presence of right artificial hip joint: Secondary | ICD-10-CM | POA: Diagnosis not present

## 2022-03-28 DIAGNOSIS — F339 Major depressive disorder, recurrent, unspecified: Secondary | ICD-10-CM | POA: Diagnosis not present

## 2022-03-28 DIAGNOSIS — R000 Tachycardia, unspecified: Secondary | ICD-10-CM | POA: Diagnosis not present

## 2022-03-28 DIAGNOSIS — Z139 Encounter for screening, unspecified: Secondary | ICD-10-CM | POA: Diagnosis not present

## 2022-03-28 DIAGNOSIS — J439 Emphysema, unspecified: Secondary | ICD-10-CM | POA: Diagnosis not present

## 2022-03-28 DIAGNOSIS — J449 Chronic obstructive pulmonary disease, unspecified: Secondary | ICD-10-CM | POA: Diagnosis not present

## 2022-03-28 DIAGNOSIS — I10 Essential (primary) hypertension: Secondary | ICD-10-CM | POA: Diagnosis not present

## 2022-03-28 DIAGNOSIS — J984 Other disorders of lung: Secondary | ICD-10-CM | POA: Diagnosis not present

## 2022-03-28 DIAGNOSIS — Z23 Encounter for immunization: Secondary | ICD-10-CM | POA: Diagnosis not present

## 2022-03-28 DIAGNOSIS — M3500 Sicca syndrome, unspecified: Secondary | ICD-10-CM | POA: Diagnosis not present

## 2022-03-28 DIAGNOSIS — Z79899 Other long term (current) drug therapy: Secondary | ICD-10-CM | POA: Diagnosis not present

## 2022-04-06 DIAGNOSIS — Z20822 Contact with and (suspected) exposure to covid-19: Secondary | ICD-10-CM | POA: Diagnosis not present

## 2022-04-06 DIAGNOSIS — J441 Chronic obstructive pulmonary disease with (acute) exacerbation: Secondary | ICD-10-CM | POA: Diagnosis not present

## 2022-04-22 DIAGNOSIS — M4126 Other idiopathic scoliosis, lumbar region: Secondary | ICD-10-CM | POA: Diagnosis not present

## 2022-07-21 DIAGNOSIS — K219 Gastro-esophageal reflux disease without esophagitis: Secondary | ICD-10-CM | POA: Diagnosis not present

## 2022-07-21 DIAGNOSIS — I10 Essential (primary) hypertension: Secondary | ICD-10-CM | POA: Diagnosis not present

## 2022-07-21 DIAGNOSIS — J449 Chronic obstructive pulmonary disease, unspecified: Secondary | ICD-10-CM | POA: Diagnosis not present

## 2022-09-20 DIAGNOSIS — I7300 Raynaud's syndrome without gangrene: Secondary | ICD-10-CM | POA: Diagnosis not present

## 2022-09-20 DIAGNOSIS — R768 Other specified abnormal immunological findings in serum: Secondary | ICD-10-CM | POA: Diagnosis not present

## 2022-09-20 DIAGNOSIS — M5136 Other intervertebral disc degeneration, lumbar region: Secondary | ICD-10-CM | POA: Diagnosis not present

## 2022-09-20 DIAGNOSIS — Z6822 Body mass index (BMI) 22.0-22.9, adult: Secondary | ICD-10-CM | POA: Diagnosis not present

## 2022-09-20 DIAGNOSIS — M3501 Sicca syndrome with keratoconjunctivitis: Secondary | ICD-10-CM | POA: Diagnosis not present

## 2022-09-20 DIAGNOSIS — M1991 Primary osteoarthritis, unspecified site: Secondary | ICD-10-CM | POA: Diagnosis not present

## 2022-09-20 DIAGNOSIS — M0609 Rheumatoid arthritis without rheumatoid factor, multiple sites: Secondary | ICD-10-CM | POA: Diagnosis not present

## 2022-10-12 DIAGNOSIS — M0609 Rheumatoid arthritis without rheumatoid factor, multiple sites: Secondary | ICD-10-CM | POA: Diagnosis not present

## 2022-10-25 DIAGNOSIS — D72819 Decreased white blood cell count, unspecified: Secondary | ICD-10-CM | POA: Diagnosis not present

## 2022-10-25 DIAGNOSIS — M0609 Rheumatoid arthritis without rheumatoid factor, multiple sites: Secondary | ICD-10-CM | POA: Diagnosis not present

## 2022-12-02 DIAGNOSIS — M3500 Sicca syndrome, unspecified: Secondary | ICD-10-CM | POA: Diagnosis not present

## 2022-12-02 DIAGNOSIS — R000 Tachycardia, unspecified: Secondary | ICD-10-CM | POA: Diagnosis not present

## 2022-12-02 DIAGNOSIS — F339 Major depressive disorder, recurrent, unspecified: Secondary | ICD-10-CM | POA: Diagnosis not present

## 2022-12-02 DIAGNOSIS — J439 Emphysema, unspecified: Secondary | ICD-10-CM | POA: Diagnosis not present

## 2022-12-02 DIAGNOSIS — Z79899 Other long term (current) drug therapy: Secondary | ICD-10-CM | POA: Diagnosis not present

## 2022-12-02 DIAGNOSIS — J984 Other disorders of lung: Secondary | ICD-10-CM | POA: Diagnosis not present

## 2022-12-02 DIAGNOSIS — I10 Essential (primary) hypertension: Secondary | ICD-10-CM | POA: Diagnosis not present

## 2022-12-05 DIAGNOSIS — D72818 Other decreased white blood cell count: Secondary | ICD-10-CM | POA: Diagnosis not present

## 2022-12-05 DIAGNOSIS — R768 Other specified abnormal immunological findings in serum: Secondary | ICD-10-CM | POA: Diagnosis not present

## 2022-12-05 DIAGNOSIS — I7300 Raynaud's syndrome without gangrene: Secondary | ICD-10-CM | POA: Diagnosis not present

## 2022-12-05 DIAGNOSIS — Z6822 Body mass index (BMI) 22.0-22.9, adult: Secondary | ICD-10-CM | POA: Diagnosis not present

## 2022-12-05 DIAGNOSIS — M3501 Sicca syndrome with keratoconjunctivitis: Secondary | ICD-10-CM | POA: Diagnosis not present

## 2022-12-05 DIAGNOSIS — M1991 Primary osteoarthritis, unspecified site: Secondary | ICD-10-CM | POA: Diagnosis not present

## 2022-12-05 DIAGNOSIS — M5136 Other intervertebral disc degeneration, lumbar region: Secondary | ICD-10-CM | POA: Diagnosis not present

## 2022-12-05 DIAGNOSIS — M0609 Rheumatoid arthritis without rheumatoid factor, multiple sites: Secondary | ICD-10-CM | POA: Diagnosis not present

## 2022-12-13 DIAGNOSIS — L03115 Cellulitis of right lower limb: Secondary | ICD-10-CM | POA: Diagnosis not present

## 2022-12-20 DIAGNOSIS — Z79899 Other long term (current) drug therapy: Secondary | ICD-10-CM | POA: Diagnosis not present

## 2022-12-20 DIAGNOSIS — M0609 Rheumatoid arthritis without rheumatoid factor, multiple sites: Secondary | ICD-10-CM | POA: Diagnosis not present

## 2023-01-24 DIAGNOSIS — R6889 Other general symptoms and signs: Secondary | ICD-10-CM | POA: Diagnosis not present

## 2023-03-06 DIAGNOSIS — M5136 Other intervertebral disc degeneration, lumbar region with discogenic back pain only: Secondary | ICD-10-CM | POA: Diagnosis not present

## 2023-03-06 DIAGNOSIS — M3501 Sicca syndrome with keratoconjunctivitis: Secondary | ICD-10-CM | POA: Diagnosis not present

## 2023-03-06 DIAGNOSIS — D72818 Other decreased white blood cell count: Secondary | ICD-10-CM | POA: Diagnosis not present

## 2023-03-06 DIAGNOSIS — Z6823 Body mass index (BMI) 23.0-23.9, adult: Secondary | ICD-10-CM | POA: Diagnosis not present

## 2023-03-06 DIAGNOSIS — R768 Other specified abnormal immunological findings in serum: Secondary | ICD-10-CM | POA: Diagnosis not present

## 2023-03-06 DIAGNOSIS — M0609 Rheumatoid arthritis without rheumatoid factor, multiple sites: Secondary | ICD-10-CM | POA: Diagnosis not present

## 2023-03-06 DIAGNOSIS — I7300 Raynaud's syndrome without gangrene: Secondary | ICD-10-CM | POA: Diagnosis not present

## 2023-03-06 DIAGNOSIS — M1991 Primary osteoarthritis, unspecified site: Secondary | ICD-10-CM | POA: Diagnosis not present

## 2023-03-20 DIAGNOSIS — U071 COVID-19: Secondary | ICD-10-CM | POA: Diagnosis not present

## 2023-03-20 DIAGNOSIS — J441 Chronic obstructive pulmonary disease with (acute) exacerbation: Secondary | ICD-10-CM | POA: Diagnosis not present

## 2023-03-20 DIAGNOSIS — R6889 Other general symptoms and signs: Secondary | ICD-10-CM | POA: Diagnosis not present

## 2023-03-24 DIAGNOSIS — J441 Chronic obstructive pulmonary disease with (acute) exacerbation: Secondary | ICD-10-CM | POA: Diagnosis not present

## 2023-03-24 DIAGNOSIS — U071 COVID-19: Secondary | ICD-10-CM | POA: Diagnosis not present

## 2023-03-28 DIAGNOSIS — I878 Other specified disorders of veins: Secondary | ICD-10-CM | POA: Diagnosis not present

## 2023-03-28 DIAGNOSIS — U071 COVID-19: Secondary | ICD-10-CM | POA: Diagnosis not present

## 2023-03-31 ENCOUNTER — Emergency Department
Admission: EM | Admit: 2023-03-31 | Discharge: 2023-03-31 | Disposition: A | Discharge status: Home / Self Care | Payer: PPO | Attending: Student in an Organized Health Care Education/Training Program | Admitting: Student in an Organized Health Care Education/Training Program

## 2023-03-31 ENCOUNTER — Other Ambulatory Visit: Payer: Self-pay

## 2023-03-31 ENCOUNTER — Emergency Department: Payer: PPO

## 2023-03-31 DIAGNOSIS — J449 Chronic obstructive pulmonary disease, unspecified: Secondary | ICD-10-CM | POA: Diagnosis not present

## 2023-03-31 DIAGNOSIS — R0789 Other chest pain: Secondary | ICD-10-CM | POA: Diagnosis not present

## 2023-03-31 DIAGNOSIS — R079 Chest pain, unspecified: Secondary | ICD-10-CM | POA: Diagnosis not present

## 2023-03-31 LAB — TROPONIN I (HIGH SENSITIVITY)
Troponin I (High Sensitivity): 8 ng/L
Troponin I (High Sensitivity): 8 ng/L

## 2023-03-31 LAB — CBC
HCT: 42.7 % (ref 36.0–46.0)
Hemoglobin: 14.6 g/dL (ref 12.0–15.0)
MCH: 30.9 pg (ref 26.0–34.0)
MCHC: 34.2 g/dL (ref 30.0–36.0)
MCV: 90.5 fL (ref 80.0–100.0)
Platelets: 419 K/uL — ABNORMAL HIGH (ref 150–400)
RBC: 4.72 MIL/uL (ref 3.87–5.11)
RDW: 12.4 % (ref 11.5–15.5)
WBC: 8.8 K/uL (ref 4.0–10.5)
nRBC: 0 % (ref 0.0–0.2)

## 2023-03-31 LAB — BASIC METABOLIC PANEL
Anion gap: 9 (ref 5–15)
BUN: 18 mg/dL (ref 8–23)
CO2: 25 mmol/L (ref 22–32)
Calcium: 9 mg/dL (ref 8.9–10.3)
Chloride: 97 mmol/L — ABNORMAL LOW (ref 98–111)
Creatinine, Ser: 0.69 mg/dL (ref 0.44–1.00)
GFR, Estimated: >60 mL/min (ref >60)
Glucose, Bld: 99 mg/dL (ref 70–99)
Potassium: 4.2 mmol/L (ref 3.5–5.1)
Sodium: 131 mmol/L — ABNORMAL LOW (ref 135–145)

## 2023-03-31 LAB — D-DIMER, QUANTITATIVE: D-Dimer, Quant: 0.29 ug{FEU}/mL (ref 0.00–0.50)

## 2023-03-31 MED ORDER — ALUM & MAG HYDROXIDE-SIMETH 200-200-20 MG/5ML PO SUSP
30.0000 mL | Freq: Once | ORAL | Status: AC
  Administered 2023-03-31: 30 mL via ORAL
  Filled 2023-03-31: qty 30

## 2023-03-31 NOTE — ED Provider Notes (Signed)
[facility] Provider Note    Event Date/Time   First MD Initiated Contact with Patient 03/31/23 1653     (approximate)   History   Chest Pain   HPI  Toni Myers is a 72 y.o. female with history of COPD not on home oxygen and recent diagnosis of COVID 10 days ago presents to the ER for evaluation of chest pain that woke her from sleep.  States it was an intermittent describes that she did have pressure associated with the symptoms.  No nausea or vomiting.  Has had some lingering intermittent symptoms throughout the day.  Has been on prednisone for COPD exacerbation and COVID did complete Paxlovid.  Nuys any history of heart disease.     Physical Exam   Triage Vital Signs: ED Triage Vitals  Encounter Vitals Group     BP 03/31/23 1428 (!) 142/95     Systolic BP Percentile --      Diastolic BP Percentile --      Pulse Rate 03/31/23 1428 83     Resp 03/31/23 1428 18     Temp 03/31/23 1428 98.5 F (36.9 C)     Temp Source 03/31/23 1428 Oral     SpO2 03/31/23 1428 92 %     Weight 03/31/23 1427 120 lb (54.4 kg)     Height 03/31/23 1427 5\' 4"  (1.626 m)     Head Circumference --      Peak Flow --      Pain Score 03/31/23 1427 7     Pain Loc --      Pain Education --      Exclude from Growth Chart --     Most recent vital signs: Vitals:   03/31/23 1428  BP: (!) 142/95  Pulse: 83  Resp: 18  Temp: 98.5 F (36.9 C)  SpO2: 92%     Constitutional: Alert  Eyes: Conjunctivae are normal.  Head: Atraumatic. Nose: No congestion/rhinnorhea. Mouth/Throat: Mucous membranes are moist.   Neck: Painless ROM.  Cardiovascular:   Good peripheral circulation. Respiratory: Normal respiratory effort.  No retractions.  Gastrointestinal: Soft and nontender.  Musculoskeletal:  no deformity Neurologic:  MAE spontaneously. No gross focal neurologic deficits are appreciated.  Skin:  Skin is warm, dry and intact. No rash noted. Psychiatric: Mood and affect are  normal. Speech and behavior are normal.    ED Results / Procedures / Treatments   Labs (all labs ordered are listed, but only abnormal results are displayed) Labs Reviewed  BASIC METABOLIC PANEL - Abnormal; Notable for the following components:      Result Value   Sodium 131 (*)    Chloride 97 (*)    All other components within normal limits  CBC - Abnormal; Notable for the following components:   Platelets 419 (*)    All other components within normal limits  D-DIMER, QUANTITATIVE  TROPONIN I (HIGH SENSITIVITY)  TROPONIN I (HIGH SENSITIVITY)     EKG  ED ECG REPORT I, Willy Eddy, the attending physician, personally viewed and interpreted this ECG.   Date: 03/31/2023  EKG Time: 14:28  Rate: 80  Rhythm: sinus  Axis: normal  Intervals: normal  ST&T Change: no stemi, no depressions    RADIOLOGY Please see ED Course for my review and interpretation.  I personally reviewed all radiographic images ordered to evaluate for the above acute complaints and reviewed radiology reports and findings.  These findings were personally discussed with the patient.  Please  see medical record for radiology report.    PROCEDURES:  Critical Care performed: No  Procedures   MEDICATIONS ORDERED IN ED: Medications  alum & mag hydroxide-simeth (MAALOX/MYLANTA) 200-200-20 MG/5ML suspension 30 mL (30 mLs Oral Given 03/31/23 1757)     IMPRESSION / MDM / ASSESSMENT AND PLAN / ED COURSE  I reviewed the triage vital signs and the nursing notes.                              Differential diagnosis includes, but is not limited to, ACS, pericarditis, esophagitis, boerhaaves, pe, dissection, pna, bronchitis, costochondritis  Patient presenting to the ER for evaluation of symptoms as described above.  Based on symptoms, risk factors and considered above differential, this presenting complaint could reflect a potentially life-threatening illness therefore the patient will be placed on  continuous pulse oximetry and telemetry for monitoring.  Laboratory evaluation will be sent to evaluate for the above complaints.  Patient well-appearing does not seem to be having symptoms of COPD exacerbation.  EKG is nonischemic initial troponin is negative.  Possible stress or anxiety related to prednisone also possible reflux related to prednisone.  She is lowers by Wells criteria presentation is complicated by recent COVID illness will send D-dimer to further stratify.  Chest x-ray on my review and interpretation without evidence of pneumothorax or consolidation.   Clinical Course as of 03/31/23 1800  Fri Mar 31, 2023  1800 Patient's D-dimer is negative.  Her troponins are negative.  Her workup is largely reassuring.  I do believe she stable and appropriate for outpatient follow-up. [PR]    Clinical Course User Index [PR] Willy Eddy, MD     FINAL CLINICAL IMPRESSION(S) / ED DIAGNOSES   Final diagnoses:  Atypical chest pain     Rx / DC Orders   ED Discharge Orders          Ordered    Ambulatory referral to Cardiology       Comments: If you have not heard from the Cardiology office within the next 72 hours please call [phone].   03/31/23 1757             Note:  This document was prepared using Dragon voice recognition software and may include unintentional dictation errors.    Willy Eddy, MD 03/31/23 1800

## 2023-03-31 NOTE — ED Triage Notes (Signed)
Pt sts that she has been having chest pain during this past night. Pt sts that she had COVID last night and would like to get checked out.

## 2023-04-10 DIAGNOSIS — J449 Chronic obstructive pulmonary disease, unspecified: Secondary | ICD-10-CM | POA: Diagnosis not present

## 2023-05-01 NOTE — Progress Notes (Deleted)
  Cardiology Office Note:  .   Date:  05/01/2023  ID:  Toni Myers, DOB May 18, 1951, MRN [id number] PCP: Lonie Peak, Cordelia Poche  [facility] Providers Cardiologist:  Kristeen Miss, MD { Click to update primary MD,subspecialty MD or APP then REFRESH:1}   History of Present Illness: .   Toni Myers is a 72 y.o. female  with history of hypertension, sinus tachycardia, COPD(nonsmoker-2nd hand smoke) and lupus.  She last saw Dr. Elease Hashimoto 06/22/2021 and toprol xl increased 50 mg daily.  Went to ED 03/31/23 with chest pain 10 days after being diagnosed with covid19. Was on prednisone. EKG and trop negative.  ROS: ***  Studies Reviewed: Marland Kitchen         Prior CV Studies: {Select studies to display:26339}  Echo 2017 Study Conclusions   - Left ventricle: The cavity size was normal. Wall thickness was    normal. Systolic function was normal. The estimated ejection    fraction was in the range of 60% to 65%. Wall motion was normal;    there were no regional wall motion abnormalities. Doppler    parameters are consistent with abnormal left ventricular    relaxation (grade 1 diastolic dysfunction).  Risk Assessment/Calculations:   {Does this patient have ATRIAL FIBRILLATION?:[phone]} No BP recorded.  {Refresh Note OR Click here to enter BP  :1}***       Physical Exam:   VS:  There were no vitals taken for this visit.   Wt Readings from Last 3 Encounters:  03/31/23 120 lb (54.4 kg)  06/14/21 118 lb (53.5 kg)  03/26/20 134 lb 6.4 oz (61 kg)    GEN: Well nourished, well developed in no acute distress NECK: No JVD; No carotid bruits CARDIAC: ***RRR, no murmurs, rubs, gallops RESPIRATORY:  Clear to auscultation without rales, wheezing or rhonchi  ABDOMEN: Soft, non-tender, non-distended EXTREMITIES:  No edema; No deformity   ASSESSMENT AND PLAN: .    Sinus tachycardia  HTN  COPD  History of lupus     {Are you ordering a CV Procedure (e.g. stress test, cath, DCCV, TEE, etc)?    Press F2        :[id number]}  Dispo: ***  Signed, Jacolyn Reedy, PA-C

## 2023-05-09 ENCOUNTER — Ambulatory Visit: Payer: PPO | Admitting: Physician Assistant

## 2023-05-09 DIAGNOSIS — R000 Tachycardia, unspecified: Secondary | ICD-10-CM

## 2023-05-09 DIAGNOSIS — I10 Essential (primary) hypertension: Secondary | ICD-10-CM

## 2023-05-09 DIAGNOSIS — J449 Chronic obstructive pulmonary disease, unspecified: Secondary | ICD-10-CM

## 2023-06-06 DIAGNOSIS — Z9181 History of falling: Secondary | ICD-10-CM | POA: Diagnosis not present

## 2023-06-06 DIAGNOSIS — J439 Emphysema, unspecified: Secondary | ICD-10-CM | POA: Diagnosis not present

## 2023-06-06 DIAGNOSIS — J984 Other disorders of lung: Secondary | ICD-10-CM | POA: Diagnosis not present

## 2023-06-06 DIAGNOSIS — Z23 Encounter for immunization: Secondary | ICD-10-CM | POA: Diagnosis not present

## 2023-06-06 DIAGNOSIS — M3500 Sicca syndrome, unspecified: Secondary | ICD-10-CM | POA: Diagnosis not present

## 2023-06-06 DIAGNOSIS — I10 Essential (primary) hypertension: Secondary | ICD-10-CM | POA: Diagnosis not present

## 2023-06-06 DIAGNOSIS — R000 Tachycardia, unspecified: Secondary | ICD-10-CM | POA: Diagnosis not present

## 2023-06-06 DIAGNOSIS — Z139 Encounter for screening, unspecified: Secondary | ICD-10-CM | POA: Diagnosis not present

## 2023-06-18 ENCOUNTER — Encounter: Payer: Self-pay | Admitting: Cardiovascular Disease

## 2023-06-18 NOTE — Progress Notes (Unsigned)
Cardiology Office Note:    Date:  06/19/2023   ID:  Toni Myers, DOB 1950-09-14, MRN [id number]  PCP:  Lonie Peak, PA-C  Cardiologist:  Kristeen Miss, MD  Electrophysiologist:  None   Referring MD: Lonie Peak, PA-C   Chief Complaint  Patient presents with   sinus tachycardia      Oct. 31, 2019   Toni Myers is a 73 y.o. female with a hx of  COPD, HTN, Lupus,   She has a history of hypertension.  She also has a history of sinus tachycardia.  We are asked to see her today by Lavera Guise, PA for further evaluation of her hypertension and sinus tachycardia.  Toni Barron Alvine")  is seen back today . I've seen her in the remote past - for ? HTN She has COPD and has never smoked.   Had lots of 2nd hand smoke  She has had  Tachycardia  Was started on Atenolol and her tachycardia is better.   Seen Dr. Delton Coombes for her COPD .   Has been losing weight . - 20 lbs  Has lots of fatigue   Jan. 31, 2020:  Toni Myers is seen back today for follow-up visit for her sinus tachycardia.  She has a history of COPD ( lifelong nonsmoker)  and hypertension. Has never smoked.   Has had COPD for years - she is not sure if she has alpha 1 antitrypsin deficiency Is doing well No caridac issues  Nov. 4, 2021:  Toni Myers is seen today for follow up of her snius tachycardia. Has COPD, has never smoked She was hospitalized with appendicitis in Aug. 2021 Had hip replacement (Alusio) , is doing well .  Jan. 23, 2023 Toni Myers is seen today for follow up of her sinus tachycardia and HTN Takes care of her 77 yo granddaugher  Exercises regularly  Had emergent appendectomy this past year  Jan. 27, 2025 Toni Myers is seen for follow up of her sinus tachycardia and HTN Was last seen 2 years ago for evaluation of her sinus tacycardia HR is rapid today  - 122   Not much caffeine  Will increase her toprol XL to 100    Past Medical History:  Diagnosis Date   Anxiety    Arthritis    Rheumatoid   Asthma     Chronic iritis, left eye    Chronic low back pain    CKD (chronic kidney disease), stage II 10/08/2015   Closed fracture of multiple pubic rami (HCC) 07/19/2013   Closed fracture of pelvic rim 07/19/2013   Cough    Depression    Dyspnea    Dysrhythmia    resolved   GERD (gastroesophageal reflux disease)    Hypertension    Migraine    none in several years   Palpitations    RLS (restless legs syndrome)    Shingles    Sjogrens syndrome (HCC)     Past Surgical History:  Procedure Laterality Date   BACK SURGERY     07/1998 and 03/1999, herniated disc, 2 rod and 4 screws   COLONOSCOPY WITH PROPOFOL N/A 02/09/2015   Procedure: COLONOSCOPY WITH PROPOFOL;  Surgeon: Charolett Bumpers, MD;  Location: [facility];  Service: Endoscopy;  Laterality: N/A;   ESOPHAGOGASTRODUODENOSCOPY (EGD) WITH PROPOFOL N/A 02/09/2015   Procedure: ESOPHAGOGASTRODUODENOSCOPY (EGD) WITH PROPOFOL;  Surgeon: Charolett Bumpers, MD;  Location: [facility];  Service: Endoscopy;  Laterality: N/A;   EYE SURGERY Bilateral 2020   LAPAROSCOPIC APPENDECTOMY N/A  01/04/2020   Procedure: APPENDECTOMY LAPAROSCOPIC;  Surgeon: Karie Soda, MD;  Location: [facility];  Service: General;  Laterality: N/A;   TOTAL HIP ARTHROPLASTY Right 11/13/2019   Procedure: TOTAL HIP ARTHROPLASTY ANTERIOR APPROACH;  Surgeon: Ollen Gross, MD;  Location: [facility];  Service: Orthopedics;  Laterality: Right;    TUBAL LIGATION      Current Medications: Current Meds  Medication Sig   albuterol (PROVENTIL) (2.5 MG/3ML) 0.083% nebulizer solution Take 2.5 mg by nebulization every 4 (four) hours as needed for wheezing or shortness of breath. Reported on 10/22/2015   albuterol (VENTOLIN HFA) 108 (90 Base) MCG/ACT inhaler Inhale 2 puffs into the lungs every 6 (six) hours as needed for wheezing or shortness of breath.    budesonide-formoterol (SYMBICORT) 160-4.5 MCG/ACT inhaler Inhale 2 puffs into the lungs 2 (two) times daily.   celecoxib (CELEBREX) 200 MG  capsule Take 200 mg by mouth daily.   gabapentin (NEURONTIN) 300 MG capsule Take 300-600 mg by mouth See admin instructions. Take 1 capsule (300 mg) by mouth twice daily & take 2 capsules (600 mg) by mouth at bedtime.   loratadine (CLARITIN) 10 MG tablet Take 10 mg by mouth daily as needed for allergies.   methocarbamol (ROBAXIN) 500 MG tablet Take 1 tablet (500 mg total) by mouth every 6 (six) hours as needed for muscle spasms.   metoprolol succinate (TOPROL-XL) 100 MG 24 hr tablet Take 1 tablet (100 mg total) by mouth daily. Take with or immediately following a meal.   Multiple Vitamin (MULTIVITAMIN) tablet Take 1 tablet by mouth daily.     Allergies:   Morphine and codeine and Prednisone   Social History   Socioeconomic History   Marital status: Divorced    Spouse name: Not on file   Number of children: Not on file   Years of education: Not on file   Highest education level: Not on file  Occupational History   Occupation: retired  Tobacco Use   Smoking status: Never   Smokeless tobacco: Never  Vaping Use   Vaping status: Never Used  Substance and Sexual Activity   Alcohol use: Yes    Comment: 2 glasses monthly   Drug use: No   Sexual activity: Not Currently    Partners: Male    Comment: divorced  Other Topics Concern   Not on file  Social History Narrative   Not on file   Social Drivers of Health   Financial Resource Strain: Not on file  Food Insecurity: Not on file  Transportation Needs: Not on file  Physical Activity: Not on file  Stress: Not on file  Social Connections: Not on file     Family History: The patient's family history includes Arthritis in her mother.  ROS:   Please see the history of present illness.     All other systems reviewed and are negative.  EKGs/Labs/Other Studies Reviewed:    The following studies were reviewed today:    Recent Labs: 03/31/2023: BUN 18; Creatinine, Ser 0.69; Hemoglobin 14.6; Platelets 419; Potassium 4.2; Sodium  131  Recent Lipid Panel No results found for: "CHOL", "TRIG", "HDL", "CHOLHDL", "VLDL", "LDLCALC", "LDLDIRECT"    Physical Exam: Blood pressure 132/80, pulse (!) 122, height 5\' 4"  (1.626 m), weight 122 lb (55.3 kg), SpO2 99%.  GEN:  Well nourished, well developed in no acute distress HEENT: Normal NECK: No JVD; No carotid bruits LYMPHATICS: No lymphadenopathy CARDIAC: RRR  ,  tachycardic  RESPIRATORY:  Clear to auscultation without rales,  wheezing or rhonchi  ABDOMEN: Soft, non-tender, non-distended MUSCULOSKELETAL:  No edema; No deformity  SKIN: Warm and dry NEUROLOGIC:  Alert and oriented x 3   EKG:                 ASSESSMENT:    1. Essential hypertension   2. Sinus tachycardia      PLAN:      Sinus tachycardia:  .  Her heart rate is between 115 and 120 today.  I tried carotid sinus massage but it did not slow.  Will increase her Toprol-XL to 100 mg a day.  She will follow-up with Tereso Newcomer, in 3 months.     3.  COPD : Followed by her primary medical doctor       Medication Adjustments/Labs and Tests Ordered: Current medicines are reviewed at length with the patient today.  Concerns regarding medicines are outlined above.  No orders of the defined types were placed in this encounter.  Meds ordered this encounter  Medications   metoprolol succinate (TOPROL-XL) 100 MG 24 hr tablet    Sig: Take 1 tablet (100 mg total) by mouth daily. Take with or immediately following a meal.    Dispense:  90 tablet    Refill:  3    Dose INCREASE     Patient Instructions  Medication Instructions:  INCREASE Toprol XL/metoprolol to  100mg  daily *If you need a refill on your cardiac medications before your next appointment, please call your pharmacy*  Follow-Up: At [facility], you and your health needs are our priority.  As part of our continuing mission to provide you with exceptional heart care, we have created designated Provider Care Teams.  These  Care Teams include your primary Cardiologist (physician) and Advanced Practice Providers (APPs -  Physician Assistants and Nurse Practitioners) who all work together to provide you with the care you need, when you need it.  Your next appointment:   3 month(s)  Provider:   Tereso Newcomer, PA     1st Floor: - Lobby - Registration  - Pharmacy  - Lab - Cafe  2nd Floor: - PV Lab - Diagnostic Testing (echo, CT, nuclear med)  3rd Floor: - Vacant  4th Floor: - TCTS (cardiothoracic surgery) - AFib Clinic - Structural Heart Clinic - Vascular Surgery  - Vascular Ultrasound  5th Floor: - HeartCare Cardiology (general and EP) - Clinical Pharmacy for coumadin, hypertension, lipid, weight-loss medications, and med management appointments    Valet parking services will be available as well.      Signed, Kristeen Miss, MD  06/19/2023 5:02 PM    [facility]

## 2023-06-19 ENCOUNTER — Encounter: Payer: Self-pay | Admitting: Cardiovascular Disease

## 2023-06-19 ENCOUNTER — Ambulatory Visit: Payer: PPO | Attending: Cardiovascular Disease | Admitting: Cardiovascular Disease

## 2023-06-19 ENCOUNTER — Telehealth: Payer: Self-pay | Admitting: Cardiovascular Disease

## 2023-06-19 VITALS — BP 132/80 | HR 122 | Ht 64.0 in | Wt 122.0 lb

## 2023-06-19 DIAGNOSIS — I10 Essential (primary) hypertension: Secondary | ICD-10-CM

## 2023-06-19 DIAGNOSIS — R000 Tachycardia, unspecified: Secondary | ICD-10-CM

## 2023-06-19 MED ORDER — METOPROLOL SUCCINATE ER 100 MG PO TB24: 100.0000 mg | ORAL_TABLET | Freq: Every day | ORAL | 3 refills | Status: AC

## 2023-06-19 NOTE — Telephone Encounter (Signed)
Pt called in asking if Dr. Elease Hashimoto suggests she take a baby aspirin.

## 2023-06-19 NOTE — Patient Instructions (Signed)
Medication Instructions:  INCREASE Toprol XL/metoprolol to  100mg  daily *If you need a refill on your cardiac medications before your next appointment, please call your pharmacy*  Follow-Up: At [facility], you and your health needs are our priority.  As part of our continuing mission to provide you with exceptional heart care, we have created designated Provider Care Teams.  These Care Teams include your primary Cardiologist (physician) and Advanced Practice Providers (APPs -  Physician Assistants and Nurse Practitioners) who all work together to provide you with the care you need, when you need it.  Your next appointment:   3 month(s)  Provider:   Tereso Newcomer, PA     1st Floor: - Lobby - Registration  - Pharmacy  - Lab - Cafe  2nd Floor: - PV Lab - Diagnostic Testing (echo, CT, nuclear med)  3rd Floor: - Vacant  4th Floor: - TCTS (cardiothoracic surgery) - AFib Clinic - Structural Heart Clinic - Vascular Surgery  - Vascular Ultrasound  5th Floor: - HeartCare Cardiology (general and EP) - Clinical Pharmacy for coumadin, hypertension, lipid, weight-loss medications, and med management appointments    Valet parking services will be available as well.

## 2023-06-23 NOTE — Telephone Encounter (Signed)
  The patient is calling back to follow up on recommendations for her baby aspirin. Additionally, she would like to know if Dr. Elease Hashimoto wants her to get lab work done since he increased some of her blood pressure medications

## 2023-06-23 NOTE — Telephone Encounter (Signed)
Returned call to patient to advise that no labs needed after increasing beta blocker and that Dr Elease Hashimoto stated she did not need to start aspirin since she has no coronary disease and no history or MI/Stents/Stroke. Pt verbalized understanding.

## 2023-07-03 DIAGNOSIS — R768 Other specified abnormal immunological findings in serum: Secondary | ICD-10-CM | POA: Diagnosis not present

## 2023-07-03 DIAGNOSIS — M0609 Rheumatoid arthritis without rheumatoid factor, multiple sites: Secondary | ICD-10-CM | POA: Diagnosis not present

## 2023-07-03 DIAGNOSIS — Z6822 Body mass index (BMI) 22.0-22.9, adult: Secondary | ICD-10-CM | POA: Diagnosis not present

## 2023-07-03 DIAGNOSIS — M3501 Sicca syndrome with keratoconjunctivitis: Secondary | ICD-10-CM | POA: Diagnosis not present

## 2023-07-03 DIAGNOSIS — M7989 Other specified soft tissue disorders: Secondary | ICD-10-CM | POA: Diagnosis not present

## 2023-07-03 DIAGNOSIS — D72818 Other decreased white blood cell count: Secondary | ICD-10-CM | POA: Diagnosis not present

## 2023-07-03 DIAGNOSIS — I7300 Raynaud's syndrome without gangrene: Secondary | ICD-10-CM | POA: Diagnosis not present

## 2023-07-03 DIAGNOSIS — M1991 Primary osteoarthritis, unspecified site: Secondary | ICD-10-CM | POA: Diagnosis not present

## 2023-07-03 DIAGNOSIS — M5136 Other intervertebral disc degeneration, lumbar region with discogenic back pain only: Secondary | ICD-10-CM | POA: Diagnosis not present

## 2023-07-18 DIAGNOSIS — F418 Other specified anxiety disorders: Secondary | ICD-10-CM | POA: Diagnosis not present

## 2023-07-18 DIAGNOSIS — G3184 Mild cognitive impairment, so stated: Secondary | ICD-10-CM | POA: Diagnosis not present

## 2023-09-18 ENCOUNTER — Ambulatory Visit: Attending: Student | Admitting: Student

## 2023-09-18 NOTE — Progress Notes (Deleted)
 Cardiology Office Note:    Date:  09/18/2023   ID:  KIMBERLA HASELTINE, DOB 12-Sep-1950, MRN [id number]  PCP:  Aloha Arnold, PA-C  Cardiologist:  Ahmad Alert, MD { Click to update primary MD,subspecialty MD or APP then REFRESH:1}    Referring MD: Aloha Arnold, PA-C   Chief Complaint: follow-up of sinus tachycardia  History of Present Illness:    Toni Myers is a 73 y.o. female with a history of sinus tachycardia, hypertension, COPD, CKD stage II, lupus, Sjogren's syndrome, migraines, anxiety/depression who is followed by Dr. Alroy Aspen and presents today for routine follow-up.  Patient was referred to Dr. Alroy Aspen in 02/2018 for further evaluation of sinus tachycardia.  Prior Echo in 2017 showed LVEF of 60-65% with normal wall motion and grade 1 diastolic dysfunction.  Tachycardia improved after she was started on Atenolol.  Tachycardia was felt to likely be due to COPD and nebulizer therapy.  Atenolol was stopped and she was transition to Toprol  XL as it was more cardioselective.  She was seen in the ED in 03/2023 for further evaluation of chest pain that wok her up from sleep. EKG showed normal sinus rhythm with no acute ischemic changes.  High-sensitivity troponin negative x 2 and D-dimer negative.  Symptoms were felt to be possibly due to stress/anxiety related to Prednisone  use or possible reflux related to Prednisone .  She was felt to be stable for discharge.  She was last seen by Dr. Alroy Aspen in 05/2023 after not being seen since 2023.  She was tachycardic at that visit with heart rate in the 120s.  Does not look like an EKG was done.  Toprol -XL was increased.  Patient presents today for follow-up. ***  Sinus Tachycardia Patient has a history of sinus tachycardia felt to likely be due to COPD and nebulizer use in the past. - EKG today shows *** - Continue Toprol -XL 100mg  daily.   Hypertension BP well controlled. *** - Continue Toprol -XL 100mg  daily.   EKGs/Labs/Other Studies  Reviewed:    The following studies were reviewed:  Echocardiogram 10/13/2015: Study Conclusions: - Left ventricle: The cavity size was normal. Wall thickness was    normal. Systolic function was normal. The estimated ejection    fraction was in the range of 60% to 65%. Wall motion was normal;    there were no regional wall motion abnormalities. Doppler    parameters are consistent with abnormal left ventricular    relaxation (grade 1 diastolic dysfunction).    EKG:  EKG ordered today. EKG personally reviewed and demonstrates ***.  Recent Labs: 03/31/2023: BUN 18; Creatinine, Ser 0.69; Hemoglobin 14.6; Platelets 419; Potassium 4.2; Sodium 131  Recent Lipid Panel No results found for: "CHOL", "TRIG", "HDL", "CHOLHDL", "VLDL", "LDLCALC", "LDLDIRECT"  Physical Exam:    Vital Signs: There were no vitals taken for this visit.    Wt Readings from Last 3 Encounters:  06/19/23 122 lb (55.3 kg)  03/31/23 120 lb (54.4 kg)  06/14/21 118 lb (53.5 kg)     General: 73 y.o. female in no acute distress. HEENT: Normocephalic and atraumatic. Sclera clear.  Neck: Supple. No carotid bruits. No JVD. Heart: *** RRR. Distinct S1 and S2. No murmurs, gallops, or rubs.  Lungs: No increased work of breathing. Clear to ausculation bilaterally. No wheezes, rhonchi, or rales.  Abdomen: Soft, non-distended, and non-tender to palpation.  Extremities: No lower extremity edema.  Radial and distal pedal pulses 2+ and equal bilaterally. Skin: Warm and dry. Neuro: No focal deficits. Psych:  Normal affect. Responds appropriately.   Assessment:    No diagnosis found.  Plan:     Disposition: Follow up in ***   Signed, Casimer Clear, PA-C  09/18/2023 9:53 AM    [facility]

## 2023-09-19 ENCOUNTER — Ambulatory Visit: Payer: PPO | Admitting: Physician Assistant

## 2023-09-19 ENCOUNTER — Encounter: Payer: Self-pay | Admitting: Student

## 2023-09-20 ENCOUNTER — Telehealth: Payer: Self-pay | Admitting: Cardiovascular Disease

## 2023-09-20 NOTE — Telephone Encounter (Signed)
 Patient c/o Palpitations:  STAT if patient reporting lightheadedness, shortness of breath, or chest pain  How long have you had palpitations/irregular HR/ Afib? Are you having the symptoms now? HR  Are you currently experiencing lightheadedness, SOB or CP? On/off cp  Do you have a history of afib (atrial fibrillation) or irregular heart rhythm? no  Have you checked your BP or HR? (document readings if available): No  Are you experiencing any other symptoms? jittery

## 2023-09-20 NOTE — Telephone Encounter (Signed)
 Spoke with pt who complains of intermittent CP and intermittent rapid HR x 6 weeks.  Pt states she does not have current BP or HR.  Pt states during conversation she has developed a wave of feeling warm and active chest discomfort.  Pt advised due to development of active CP recommendation is for pt to be further evaluated in the ED.  Pt advised not to drive herself.  Pt verbalizes understanding and states she will go to ED if she can.  Encouraged pt not to wait.

## 2023-09-25 ENCOUNTER — Ambulatory Visit: Admitting: Nurse Practitioner

## 2023-09-25 DIAGNOSIS — G3184 Mild cognitive impairment, so stated: Secondary | ICD-10-CM | POA: Diagnosis not present

## 2023-09-25 DIAGNOSIS — M545 Low back pain, unspecified: Secondary | ICD-10-CM | POA: Diagnosis not present

## 2023-09-25 DIAGNOSIS — F418 Other specified anxiety disorders: Secondary | ICD-10-CM | POA: Diagnosis not present

## 2023-09-25 DIAGNOSIS — G8929 Other chronic pain: Secondary | ICD-10-CM | POA: Diagnosis not present

## 2023-10-02 ENCOUNTER — Encounter: Payer: Self-pay | Admitting: Cardiovascular Disease

## 2023-10-02 ENCOUNTER — Ambulatory Visit: Attending: Cardiovascular Disease | Admitting: Cardiovascular Disease

## 2023-10-02 VITALS — BP 136/78 | HR 91 | Ht 64.0 in | Wt 119.6 lb

## 2023-10-02 DIAGNOSIS — I10 Essential (primary) hypertension: Secondary | ICD-10-CM | POA: Diagnosis not present

## 2023-10-02 DIAGNOSIS — R000 Tachycardia, unspecified: Secondary | ICD-10-CM | POA: Diagnosis not present

## 2023-10-02 NOTE — Patient Instructions (Signed)
 Follow-Up: At [facility], you and your health needs are our priority.  As part of our continuing mission to provide you with exceptional heart care, our providers are all part of one team.  This team includes your primary Cardiologist (physician) and Advanced Practice Providers or APPs (Physician Assistants and Nurse Practitioners) who all work together to provide you with the care you need, when you need it.  Your next appointment:   1 year(s)  Provider:   Ahmad Alert, MD

## 2023-10-02 NOTE — Progress Notes (Signed)
 Cardiology Office Note:    Date:  10/02/2023   ID:  Toni Myers, DOB 1951-02-09, MRN [id number]  PCP:  Aloha Arnold, PA-C  Cardiologist:  Ahmad Alert, MD  Electrophysiologist:  None   Referring MD: Aloha Arnold, PA-C   Chief Complaint  Patient presents with   Hypertension          Oct. 31, 2019   Toni Myers is a 73 y.o. female with a hx of  COPD, HTN, Lupus,   She has a history of hypertension.  She also has a history of sinus tachycardia.  We are asked to see her today by Geri Ko, PA for further evaluation of her hypertension and sinus tachycardia.  Calder Donnette Gal")  is seen back today . I've seen her in the remote past - for ? HTN She has COPD and has never smoked.   Had lots of 2nd hand smoke  She has had  Tachycardia  Was started on Atenolol and her tachycardia is better.   Seen Dr. Baldwin Levee for her COPD .   Has been losing weight . - 20 lbs  Has lots of fatigue   Jan. 31, 2020:  Toni Myers is seen back today for follow-up visit for her sinus tachycardia.  She has a history of COPD ( lifelong nonsmoker)  and hypertension. Has never smoked.   Has had COPD for years - she is not sure if she has alpha 1 antitrypsin deficiency Is doing well No caridac issues  Nov. 4, 2021:  Toni Myers is seen today for follow up of her snius tachycardia. Has COPD, has never smoked She was hospitalized with appendicitis in Aug. 2021 Had hip replacement (Alusio) , is doing well .  Jan. 23, 2023 Toni Myers is seen today for follow up of her sinus tachycardia and HTN Takes care of her 46 yo granddaugher  Exercises regularly  Had emergent appendectomy this past year  Jan. 27, 2025 Toni Myers is seen for follow up of her sinus tachycardia and HTN Was last seen 2 years ago for evaluation of her sinus tacycardia HR is rapid today  - 122   Not much caffeine  Will increase her toprol  XL to 100     Oct 02, 2023 Toni Myers is seen for follow up of her sinus tachycaredia , HTN  Her HR is better  after we increased her metoprolol   Occasional DOE Walks perhaps 2 miles a day    Past Medical History:  Diagnosis Date   Anxiety    Arthritis    Rheumatoid   Asthma    Chronic iritis, left eye    Chronic low back pain    CKD (chronic kidney disease), stage II 10/08/2015   Closed fracture of multiple pubic rami (HCC) 07/19/2013   Closed fracture of pelvic rim 07/19/2013   Cough    Depression    Dyspnea    Dysrhythmia    resolved   GERD (gastroesophageal reflux disease)    Hypertension    Migraine    none in several years   Palpitations    RLS (restless legs syndrome)    Shingles    Sjogrens syndrome (HCC)     Past Surgical History:  Procedure Laterality Date   BACK SURGERY     07/1998 and 03/1999, herniated disc, 2 rod and 4 screws   COLONOSCOPY WITH PROPOFOL  N/A 02/09/2015   Procedure: COLONOSCOPY WITH PROPOFOL ;  Surgeon: Garrett Kallman, MD;  Location: [facility];  Service: Endoscopy;  Laterality: N/A;  ESOPHAGOGASTRODUODENOSCOPY (EGD) WITH PROPOFOL  N/A 02/09/2015   Procedure: ESOPHAGOGASTRODUODENOSCOPY (EGD) WITH PROPOFOL ;  Surgeon: Garrett Kallman, MD;  Location: [facility];  Service: Endoscopy;  Laterality: N/A;   EYE SURGERY Bilateral 2020   LAPAROSCOPIC APPENDECTOMY N/A 01/04/2020   Procedure: APPENDECTOMY LAPAROSCOPIC;  Surgeon: Candyce Champagne, MD;  Location: [facility];  Service: General;  Laterality: N/A;   TOTAL HIP ARTHROPLASTY Right 11/13/2019   Procedure: TOTAL HIP ARTHROPLASTY ANTERIOR APPROACH;  Surgeon: Liliane Rei, MD;  Location: [facility];  Service: Orthopedics;  Laterality: Right;    TUBAL LIGATION      Current Medications: Current Meds  Medication Sig   albuterol  (PROVENTIL ) (2.5 MG/3ML) 0.083% nebulizer solution Take 2.5 mg by nebulization every 4 (four) hours as needed for wheezing or shortness of breath. Reported on 10/22/2015   albuterol  (VENTOLIN  HFA) 108 (90 Base) MCG/ACT inhaler Inhale 2 puffs into the lungs every 6 (six) hours as needed  for wheezing or shortness of breath.    budesonide -formoterol  (SYMBICORT ) 160-4.5 MCG/ACT inhaler Inhale 2 puffs into the lungs 2 (two) times daily.   escitalopram  (LEXAPRO ) 10 MG tablet Take 10 mg by mouth daily.   folic acid  (FOLVITE ) 1 MG tablet Take 3 mg by mouth daily.   loratadine  (CLARITIN ) 10 MG tablet Take 10 mg by mouth daily as needed for allergies.   methocarbamol  (ROBAXIN ) 500 MG tablet Take 1 tablet (500 mg total) by mouth every 6 (six) hours as needed for muscle spasms.   metoprolol  succinate (TOPROL -XL) 100 MG 24 hr tablet Take 1 tablet (100 mg total) by mouth daily. Take with or immediately following a meal.   Multiple Vitamin (MULTIVITAMIN) tablet Take 1 tablet by mouth daily.   oxyCODONE  (OXY IR/ROXICODONE ) 5 MG immediate release tablet Take 1-2 tablets (5-10 mg total) by mouth every 6 (six) hours as needed for moderate pain or severe pain.     Allergies:   Morphine and codeine and Prednisone    Social History   Socioeconomic History   Marital status: Divorced    Spouse name: Not on file   Number of children: Not on file   Years of education: Not on file   Highest education level: Not on file  Occupational History   Occupation: retired  Tobacco Use   Smoking status: Never   Smokeless tobacco: Never  Vaping Use   Vaping status: Never Used  Substance and Sexual Activity   Alcohol use: Yes    Comment: 2 glasses monthly   Drug use: No   Sexual activity: Not Currently    Partners: Male    Comment: divorced  Other Topics Concern   Not on file  Social History Narrative   Not on file   Social Drivers of Health   Financial Resource Strain: Not on file  Food Insecurity: Not on file  Transportation Needs: Not on file  Physical Activity: Not on file  Stress: Not on file  Social Connections: Not on file     Family History: The patient's family history includes Arthritis in her mother.  ROS:   Please see the history of present illness.     All other systems  reviewed and are negative.  EKGs/Labs/Other Studies Reviewed:    The following studies were reviewed today:    Recent Labs: 03/31/2023: BUN 18; Creatinine, Ser 0.69; Hemoglobin 14.6; Platelets 419; Potassium 4.2; Sodium 131  Recent Lipid Panel No results found for: "CHOL", "TRIG", "HDL", "CHOLHDL", "VLDL", "LDLCALC", "LDLDIRECT"    Physical Exam: Blood pressure 136/78, pulse  91, height 5\' 4"  (1.626 m), weight 119 lb 9.6 oz (54.3 kg), SpO2 93%.      GEN:  Well nourished, well developed in no acute distress HEENT: Normal NECK: No JVD; No carotid bruits LYMPHATICS: No lymphadenopathy CARDIAC: RRR , very soft systolic murmur  RESPIRATORY:  Clear to auscultation without rales, wheezing or rhonchi  ABDOMEN: Soft, non-tender, non-distended MUSCULOSKELETAL:  No edema; No deformity  SKIN: Warm and dry NEUROLOGIC:  Alert and oriented x 3   EKG Interpretation Date/Time:  Monday Oct 02 2023 14:25:41 EDT Ventricular Rate:  91 PR Interval:  174 QRS Duration:  82 QT Interval:  328 QTC Calculation: 403 R Axis:   34  Text Interpretation: Normal sinus rhythm with sinus arrhythmia Nonspecific T wave abnormality When compared with ECG of 31-Mar-2023 14:28, No significant change was found Confirmed by Ahmad Alert ([id number]) on 10/02/2023 2:54:11 PM          ASSESSMENT:    1. Essential hypertension   2. Sinus tachycardia      PLAN:      Sinus tachycardia:  .      Better on the metoprolol     3.  COPD :         Medication Adjustments/Labs and Tests Ordered: Current medicines are reviewed at length with the patient today.  Concerns regarding medicines are outlined above.  Orders Placed This Encounter  Procedures   EKG 12-Lead   No orders of the defined types were placed in this encounter.    There are no Patient Instructions on file for this visit.   Signed, Ahmad Alert, MD  10/02/2023 2:54 PM    [facility]

## 2023-11-06 DIAGNOSIS — R300 Dysuria: Secondary | ICD-10-CM | POA: Diagnosis not present

## 2023-11-28 ENCOUNTER — Ambulatory Visit: Admitting: Nurse Practitioner

## 2023-12-06 DIAGNOSIS — U071 COVID-19: Secondary | ICD-10-CM | POA: Diagnosis not present

## 2023-12-22 DIAGNOSIS — J449 Chronic obstructive pulmonary disease, unspecified: Secondary | ICD-10-CM | POA: Diagnosis not present

## 2023-12-22 DIAGNOSIS — J439 Emphysema, unspecified: Secondary | ICD-10-CM | POA: Diagnosis not present

## 2023-12-22 DIAGNOSIS — F418 Other specified anxiety disorders: Secondary | ICD-10-CM | POA: Diagnosis not present

## 2023-12-22 DIAGNOSIS — J984 Other disorders of lung: Secondary | ICD-10-CM | POA: Diagnosis not present

## 2023-12-22 DIAGNOSIS — R000 Tachycardia, unspecified: Secondary | ICD-10-CM | POA: Diagnosis not present

## 2023-12-22 DIAGNOSIS — I10 Essential (primary) hypertension: Secondary | ICD-10-CM | POA: Diagnosis not present

## 2023-12-22 DIAGNOSIS — G3184 Mild cognitive impairment, so stated: Secondary | ICD-10-CM | POA: Diagnosis not present

## 2023-12-22 DIAGNOSIS — M810 Age-related osteoporosis without current pathological fracture: Secondary | ICD-10-CM | POA: Diagnosis not present

## 2023-12-26 ENCOUNTER — Other Ambulatory Visit: Payer: Self-pay | Admitting: Physician Assistant

## 2023-12-26 DIAGNOSIS — G3184 Mild cognitive impairment, so stated: Secondary | ICD-10-CM

## 2024-01-08 DIAGNOSIS — M3501 Sicca syndrome with keratoconjunctivitis: Secondary | ICD-10-CM | POA: Diagnosis not present

## 2024-01-08 DIAGNOSIS — I7300 Raynaud's syndrome without gangrene: Secondary | ICD-10-CM | POA: Diagnosis not present

## 2024-01-08 DIAGNOSIS — M1991 Primary osteoarthritis, unspecified site: Secondary | ICD-10-CM | POA: Diagnosis not present

## 2024-01-08 DIAGNOSIS — D72818 Other decreased white blood cell count: Secondary | ICD-10-CM | POA: Diagnosis not present

## 2024-01-08 DIAGNOSIS — M5136 Other intervertebral disc degeneration, lumbar region with discogenic back pain only: Secondary | ICD-10-CM | POA: Diagnosis not present

## 2024-01-08 DIAGNOSIS — R768 Other specified abnormal immunological findings in serum: Secondary | ICD-10-CM | POA: Diagnosis not present

## 2024-01-08 DIAGNOSIS — Z6822 Body mass index (BMI) 22.0-22.9, adult: Secondary | ICD-10-CM | POA: Diagnosis not present

## 2024-01-08 DIAGNOSIS — M0609 Rheumatoid arthritis without rheumatoid factor, multiple sites: Secondary | ICD-10-CM | POA: Diagnosis not present

## 2024-01-17 ENCOUNTER — Ambulatory Visit
Admission: RE | Admit: 2024-01-17 | Discharge: 2024-01-17 | Disposition: A | Discharge status: Home / Self Care | Source: Ambulatory Visit | Attending: Physician Assistant | Admitting: Physician Assistant

## 2024-01-17 DIAGNOSIS — G3184 Mild cognitive impairment, so stated: Secondary | ICD-10-CM

## 2024-01-17 DIAGNOSIS — R413 Other amnesia: Secondary | ICD-10-CM | POA: Diagnosis not present

## 2024-01-17 DIAGNOSIS — H9190 Unspecified hearing loss, unspecified ear: Secondary | ICD-10-CM | POA: Diagnosis not present

## 2024-02-02 ENCOUNTER — Other Ambulatory Visit: Payer: Self-pay

## 2024-02-02 ENCOUNTER — Inpatient Hospital Stay
Admission: EM | Admit: 2024-02-02 | Discharge: 2024-02-04 | DRG: 392 | Disposition: A | Discharge status: Home / Self Care | Attending: Internal Medicine | Admitting: Internal Medicine

## 2024-02-02 DIAGNOSIS — K219 Gastro-esophageal reflux disease without esophagitis: Secondary | ICD-10-CM | POA: Diagnosis present

## 2024-02-02 DIAGNOSIS — Z888 Allergy status to other drugs, medicaments and biological substances status: Secondary | ICD-10-CM

## 2024-02-02 DIAGNOSIS — I10 Essential (primary) hypertension: Secondary | ICD-10-CM | POA: Diagnosis not present

## 2024-02-02 DIAGNOSIS — K529 Noninfective gastroenteritis and colitis, unspecified: Secondary | ICD-10-CM | POA: Diagnosis not present

## 2024-02-02 DIAGNOSIS — Z96641 Presence of right artificial hip joint: Secondary | ICD-10-CM | POA: Diagnosis present

## 2024-02-02 DIAGNOSIS — G2581 Restless legs syndrome: Secondary | ICD-10-CM | POA: Diagnosis present

## 2024-02-02 DIAGNOSIS — N182 Chronic kidney disease, stage 2 (mild): Secondary | ICD-10-CM | POA: Diagnosis present

## 2024-02-02 DIAGNOSIS — M069 Rheumatoid arthritis, unspecified: Secondary | ICD-10-CM | POA: Diagnosis present

## 2024-02-02 DIAGNOSIS — Z885 Allergy status to narcotic agent status: Secondary | ICD-10-CM

## 2024-02-02 DIAGNOSIS — F0390 Unspecified dementia without behavioral disturbance: Secondary | ICD-10-CM | POA: Diagnosis present

## 2024-02-02 DIAGNOSIS — F32A Depression, unspecified: Secondary | ICD-10-CM | POA: Diagnosis present

## 2024-02-02 DIAGNOSIS — I129 Hypertensive chronic kidney disease with stage 1 through stage 4 chronic kidney disease, or unspecified chronic kidney disease: Secondary | ICD-10-CM | POA: Diagnosis present

## 2024-02-02 DIAGNOSIS — F419 Anxiety disorder, unspecified: Secondary | ICD-10-CM | POA: Diagnosis present

## 2024-02-02 DIAGNOSIS — Z7951 Long term (current) use of inhaled steroids: Secondary | ICD-10-CM

## 2024-02-02 DIAGNOSIS — R112 Nausea with vomiting, unspecified: Secondary | ICD-10-CM | POA: Diagnosis not present

## 2024-02-02 DIAGNOSIS — M3500 Sicca syndrome, unspecified: Secondary | ICD-10-CM | POA: Diagnosis present

## 2024-02-02 DIAGNOSIS — F0393 Unspecified dementia, unspecified severity, with mood disturbance: Secondary | ICD-10-CM | POA: Diagnosis present

## 2024-02-02 DIAGNOSIS — Z79899 Other long term (current) drug therapy: Secondary | ICD-10-CM

## 2024-02-02 DIAGNOSIS — J4489 Other specified chronic obstructive pulmonary disease: Secondary | ICD-10-CM | POA: Diagnosis present

## 2024-02-02 DIAGNOSIS — Z8261 Family history of arthritis: Secondary | ICD-10-CM

## 2024-02-02 LAB — CBC
HCT: 44.4 % (ref 36.0–46.0)
Hemoglobin: 15 g/dL (ref 12.0–15.0)
MCH: 30.4 pg (ref 26.0–34.0)
MCHC: 33.8 g/dL (ref 30.0–36.0)
MCV: 89.9 fL (ref 80.0–100.0)
Platelets: 325 K/uL (ref 150–400)
RBC: 4.94 MIL/uL (ref 3.87–5.11)
RDW: 12.9 % (ref 11.5–15.5)
WBC: 5.9 K/uL (ref 4.0–10.5)
nRBC: 0 % (ref 0.0–0.2)

## 2024-02-02 LAB — COMPREHENSIVE METABOLIC PANEL WITH GFR
ALT: 11 U/L (ref 0–44)
AST: 25 U/L (ref 15–41)
Albumin: 4.5 g/dL (ref 3.5–5.0)
Alkaline Phosphatase: 59 U/L (ref 38–126)
Anion gap: 13 (ref 5–15)
BUN: 11 mg/dL (ref 8–23)
CO2: 25 mmol/L (ref 22–32)
Calcium: 9.8 mg/dL (ref 8.9–10.3)
Chloride: 99 mmol/L (ref 98–111)
Creatinine, Ser: 0.61 mg/dL (ref 0.44–1.00)
GFR, Estimated: >60 mL/min (ref >60)
Glucose, Bld: 115 mg/dL — ABNORMAL HIGH (ref 70–99)
Potassium: 3.5 mmol/L (ref 3.5–5.1)
Sodium: 137 mmol/L (ref 135–145)
Total Bilirubin: 2.4 mg/dL — ABNORMAL HIGH (ref 0.0–1.2)
Total Protein: 8.3 g/dL — ABNORMAL HIGH (ref 6.5–8.1)

## 2024-02-02 LAB — LIPASE, BLOOD: Lipase: 33 U/L (ref 11–51)

## 2024-02-02 MED ORDER — SODIUM CHLORIDE 0.9 % IV SOLN: INTRAVENOUS | Status: AC

## 2024-02-02 MED ORDER — ONDANSETRON HCL 4 MG/2ML IJ SOLN
4.0000 mg | Freq: Four times a day (QID) | INTRAMUSCULAR | Status: DC | PRN
  Administered 2024-02-03: 4 mg via INTRAVENOUS
  Filled 2024-02-02: qty 2

## 2024-02-02 MED ORDER — ACETAMINOPHEN 650 MG RE SUPP: 650.0000 mg | Freq: Four times a day (QID) | RECTAL | Status: DC | PRN

## 2024-02-02 MED ORDER — METOCLOPRAMIDE HCL 5 MG/ML IJ SOLN
10.0000 mg | Freq: Three times a day (TID) | INTRAMUSCULAR | Status: DC
  Administered 2024-02-02 – 2024-02-04 (×6): 10 mg via INTRAVENOUS
  Filled 2024-02-02 (×6): qty 2

## 2024-02-02 MED ORDER — BOOST / RESOURCE BREEZE PO LIQD CUSTOM
1.0000 | Freq: Three times a day (TID) | ORAL | Status: DC
  Administered 2024-02-02 – 2024-02-03 (×2): 1 via ORAL

## 2024-02-02 MED ORDER — ONDANSETRON HCL 4 MG PO TABS: 4.0000 mg | ORAL_TABLET | Freq: Four times a day (QID) | ORAL | Status: DC | PRN

## 2024-02-02 MED ORDER — HYDROXYCHLOROQUINE SULFATE 200 MG PO TABS
200.0000 mg | ORAL_TABLET | Freq: Every day | ORAL | Status: DC
  Administered 2024-02-03 – 2024-02-04 (×2): 200 mg via ORAL
  Filled 2024-02-02 (×2): qty 1

## 2024-02-02 MED ORDER — SODIUM CHLORIDE 0.9 % IV BOLUS
500.0000 mL | Freq: Once | INTRAVENOUS | Status: AC
  Administered 2024-02-02: 500 mL via INTRAVENOUS

## 2024-02-02 MED ORDER — SODIUM CHLORIDE 0.9 % IV SOLN: Freq: Once | INTRAVENOUS | Status: AC

## 2024-02-02 MED ORDER — AMLODIPINE BESYLATE 5 MG PO TABS
2.5000 mg | ORAL_TABLET | Freq: Every day | ORAL | Status: DC
  Administered 2024-02-02 – 2024-02-04 (×3): 2.5 mg via ORAL
  Filled 2024-02-02 (×3): qty 1

## 2024-02-02 MED ORDER — METOPROLOL SUCCINATE ER 100 MG PO TB24
100.0000 mg | ORAL_TABLET | Freq: Every day | ORAL | Status: DC
  Administered 2024-02-03 – 2024-02-04 (×2): 100 mg via ORAL
  Filled 2024-02-02 (×2): qty 1

## 2024-02-02 MED ORDER — POLYETHYLENE GLYCOL 3350 17 G PO PACK: 17.0000 g | PACK | Freq: Every day | ORAL | Status: DC | PRN

## 2024-02-02 MED ORDER — ONDANSETRON HCL 4 MG/2ML IJ SOLN
4.0000 mg | Freq: Once | INTRAMUSCULAR | Status: AC
  Administered 2024-02-02: 4 mg via INTRAVENOUS
  Filled 2024-02-02: qty 2

## 2024-02-02 MED ORDER — SODIUM CHLORIDE 0.9 % IV SOLN
12.5000 mg | Freq: Once | INTRAVENOUS | Status: AC
  Administered 2024-02-02: 12.5 mg via INTRAVENOUS
  Filled 2024-02-02: qty 12.5

## 2024-02-02 MED ORDER — ACETAMINOPHEN 325 MG PO TABS: 650.0000 mg | ORAL_TABLET | Freq: Four times a day (QID) | ORAL | Status: DC | PRN

## 2024-02-02 MED ORDER — DULOXETINE HCL 30 MG PO CPEP
60.0000 mg | ORAL_CAPSULE | Freq: Every day | ORAL | Status: DC
  Administered 2024-02-03 – 2024-02-04 (×2): 60 mg via ORAL
  Filled 2024-02-02 (×2): qty 2

## 2024-02-02 MED ORDER — ENOXAPARIN SODIUM 40 MG/0.4ML IJ SOSY
40.0000 mg | PREFILLED_SYRINGE | INTRAMUSCULAR | Status: DC
  Administered 2024-02-02 – 2024-02-03 (×2): 40 mg via SUBCUTANEOUS
  Filled 2024-02-02 (×2): qty 0.4

## 2024-02-02 MED ORDER — ALBUTEROL SULFATE (2.5 MG/3ML) 0.083% IN NEBU: 2.5000 mg | INHALATION_SOLUTION | RESPIRATORY_TRACT | Status: DC | PRN

## 2024-02-02 NOTE — ED Notes (Signed)
 Fall bundle in place. Fall bracelet and non-skid socks placed on pt. Bed alarm activated & audible.

## 2024-02-02 NOTE — H&P (Signed)
 History and Physical    Toni Myers FMW:[id number] DOB: Jan 27, 1951 DOA: 02/02/2024  DOS: the patient was seen and examined on 02/02/2024  PCP: Montey Lot, PA-C   Patient coming from: Home  I have personally briefly reviewed patient's old medical records in [facility]  Chief Complaint: Nausea , vomiting and diarrhea  HPI: Toni Myers is a pleasant 73 y.o. female with medical history significant for COPD not on home oxygen , HTN, anxiety/depression, mild cognitive impairment who was brought in to ED for nausea vomiting and diarrhea that started since Wednesday evening.  Patient stated that she had eaten some abdominal chicken at her daughter's house.  She went home and she started having nausea vomiting and diarrhea since then.  Patient is stated that she had been having severe watery diarrhea 3 or 4 5 times a day.  She also has vomiting that comes with the food particles in the beginning and then water .  She was dry heaving in the emergency room.  She denies any fever or chills.  She denies chest pain shortness of palpitations.  She has a mild cognitive impairment and she does not remember everything.  Her son and her friend was at bedside and they help with the history.  ED Course: Upon arrival to the ED, patient is found to be hypertension at 164/99, patient was dry heaving even after Zofran  and hospitalist service was consulted for evaluation for admission for intractable nausea and vomiting.  Review of Systems:  ROS  All other systems negative except as noted in the HPI.  Past Medical History:  Diagnosis Date   Anxiety    Arthritis    Rheumatoid   Asthma    Chronic iritis, left eye    Chronic low back pain    CKD (chronic kidney disease), stage II 10/08/2015   Closed fracture of multiple pubic rami (HCC) 07/19/2013   Closed fracture of pelvic rim 07/19/2013   Cough    Depression    Dyspnea    Dysrhythmia    resolved   GERD (gastroesophageal reflux disease)     Hypertension    Migraine    none in several years   Palpitations    RLS (restless legs syndrome)    Shingles    Sjogrens syndrome [facility])     Past Surgical History:  Procedure Laterality Date   BACK SURGERY     07/1998 and 03/1999, herniated disc, 2 rod and 4 screws   COLONOSCOPY WITH PROPOFOL  N/A 02/09/2015   Procedure: COLONOSCOPY WITH PROPOFOL ;  Surgeon: Gladis MARLA Louder, MD;  Location: [facility];  Service: Endoscopy;  Laterality: N/A;   ESOPHAGOGASTRODUODENOSCOPY (EGD) WITH PROPOFOL  N/A 02/09/2015   Procedure: ESOPHAGOGASTRODUODENOSCOPY (EGD) WITH PROPOFOL ;  Surgeon: Gladis MARLA Louder, MD;  Location: [facility];  Service: Endoscopy;  Laterality: N/A;   EYE SURGERY Bilateral 2020   LAPAROSCOPIC APPENDECTOMY N/A 01/04/2020   Procedure: APPENDECTOMY LAPAROSCOPIC;  Surgeon: Sheldon Standing, MD;  Location: [facility];  Service: General;  Laterality: N/A;   TOTAL HIP ARTHROPLASTY Right 11/13/2019   Procedure: TOTAL HIP ARTHROPLASTY ANTERIOR APPROACH;  Surgeon: Melodi Lerner, MD;  Location: [facility];  Service: Orthopedics;  Laterality: Right;    TUBAL LIGATION       reports that she has never smoked. She has never used smokeless tobacco. She reports current alcohol use. She reports that she does not use drugs.  Allergies  Allergen Reactions   Morphine And Codeine Nausea And Vomiting    makes me sick  at high doses; doesn't like to take opioids   Prednisone  Other (See Comments)    Causes insomnia by the 3rd day of taking.prednisone  taper.    Family History  Problem Relation Age of Onset   Arthritis Mother     Prior to Admission medications   Medication Sig Start Date End Date Taking? Authorizing Provider  albuterol  (PROVENTIL ) (2.5 MG/3ML) 0.083% nebulizer solution Take 2.5 mg by nebulization every 4 (four) hours as needed for wheezing or shortness of breath. Reported on 10/22/2015    [provider]  albuterol  (VENTOLIN  HFA) 108 (90 Base) MCG/ACT inhaler Inhale 2 puffs  into the lungs every 6 (six) hours as needed for wheezing or shortness of breath.     [provider]  budesonide -formoterol  (SYMBICORT ) 160-4.5 MCG/ACT inhaler Inhale 2 puffs into the lungs 2 (two) times daily. 12/21/18   Oley Bascom RAMAN, NP  escitalopram  (LEXAPRO ) 10 MG tablet Take 10 mg by mouth daily.    [provider]  folic acid  (FOLVITE ) 1 MG tablet Take 3 mg by mouth daily. 12/09/19   [provider]  loratadine  (CLARITIN ) 10 MG tablet Take 10 mg by mouth daily as needed for allergies.    [provider]  methocarbamol  (ROBAXIN ) 500 MG tablet Take 1 tablet (500 mg total) by mouth every 6 (six) hours as needed for muscle spasms. 11/14/19   Patti Rosina SAUNDERS, PA-C  metoprolol  succinate (TOPROL -XL) 100 MG 24 hr tablet Take 1 tablet (100 mg total) by mouth daily. Take with or immediately following a meal. 06/19/23   Nahser, Aleene PARAS, MD  Multiple Vitamin (MULTIVITAMIN) tablet Take 1 tablet by mouth daily.    [provider]  oxyCODONE  (OXY IR/ROXICODONE ) 5 MG immediate release tablet Take 1-2 tablets (5-10 mg total) by mouth every 6 (six) hours as needed for moderate pain or severe pain. 11/14/19   Patti Rosina SAUNDERS, PA-C    Physical Exam: Vitals:   02/02/24 1430 02/02/24 1500 02/02/24 1530 02/02/24 1637  BP: (!) 129/97 (!) 140/96 (!) 133/95 (!) 149/97  Pulse: (!) 111 (!) 116 (!) 107 (!) 107  Resp: (!) 21 19 19 16   Temp:    98.6 F (37 C)  TempSrc:    Oral  SpO2: 98% 97% 99% 100%  Weight:      Height:        Physical Exam   Constitutional: Alert, awake, calm, comfortable HEENT: Neck supple Respiratory: Clear to auscultation B/L, no wheezing, no rales.  Cardiovascular: Regular rate and rhythm, no murmurs / rubs / gallops. No extremity edema. 2+ pedal pulses. No carotid bruits.  Abdomen: Soft, no tenderness, Bowel sounds positive.  Musculoskeletal: no clubbing / cyanosis. Good ROM, no contractures. Normal muscle tone.  Skin: no rashes,  lesions, ulcers. Neurologic: CN 2-12 grossly intact. Sensation intact, No focal deficit identified Psychiatric: Alert and oriented x 3. Normal mood.    Labs on Admission: I have personally reviewed following labs and imaging studies  CBC: Recent Labs  Lab 02/02/24 0934  WBC 5.9  HGB 15.0  HCT 44.4  MCV 89.9  PLT 325   Basic Metabolic Panel: Recent Labs  Lab 02/02/24 0934  NA 137  K 3.5  CL 99  CO2 25  GLUCOSE 115*  BUN 11  CREATININE 0.61  CALCIUM  9.8   GFR: Estimated Creatinine Clearance: 50.3 mL/min (by C-G formula based on SCr of 0.61 mg/dL). Liver Function Tests: Recent Labs  Lab 02/02/24 0934  AST 25  ALT 11  ALKPHOS 59  BILITOT 2.4*  PROT 8.3*  ALBUMIN 4.5   Recent Labs  Lab 02/02/24 0934  LIPASE 33   No results for input(s): AMMONIA in the last 168 hours. Coagulation Profile: No results for input(s): INR, PROTIME in the last 168 hours. Cardiac Enzymes: No results for input(s): CKTOTAL, CKMB, CKMBINDEX, TROPONINI, TROPONINIHS in the last 168 hours. BNP (last 3 results) No results for input(s): BNP in the last 8760 hours. HbA1C: No results for input(s): HGBA1C in the last 72 hours. CBG: No results for input(s): GLUCAP in the last 168 hours. Lipid Profile: No results for input(s): CHOL, HDL, LDLCALC, TRIG, CHOLHDL, LDLDIRECT in the last 72 hours. Thyroid  Function Tests: No results for input(s): TSH, T4TOTAL, FREET4, T3FREE, THYROIDAB in the last 72 hours. Anemia Panel: No results for input(s): VITAMINB12, FOLATE, FERRITIN, TIBC, IRON, RETICCTPCT in the last 72 hours. Urine analysis:    Component Value Date/Time   COLORURINE YELLOW 01/04/2020 1250   APPEARANCEUR CLEAR 01/04/2020 1250   LABSPEC 1.029 01/04/2020 1250   PHURINE 5.0 01/04/2020 1250   GLUCOSEU NEGATIVE 01/04/2020 1250   HGBUR NEGATIVE 01/04/2020 1250   BILIRUBINUR NEGATIVE 01/04/2020 1250   KETONESUR 5 (A) 01/04/2020 1250    PROTEINUR NEGATIVE 01/04/2020 1250   NITRITE NEGATIVE 01/04/2020 1250   LEUKOCYTESUR NEGATIVE 01/04/2020 1250    Radiological Exams on Admission: I have personally reviewed images No results found.  EKG: My personal interpretation of EKG shows: Sinus rhythm    Assessment/Plan Principal Problem:   Acute gastroenteritis Active Problems:   Essential hypertension   Depression   RLS (restless legs syndrome)   Rheumatoid arthritis (HCC)   Dementia without behavioral disturbance (HCC)   Gastroesophageal reflux disease    Assessment and Plan: 73 year old female known/PMH of HTN, anxiety/depression, restless leg syndrome, rheumatoid arthritis, mild cognitive impairment, GERD who came into ED for nausea vomiting and diarrhea.  1.  Acute gastroenteritis - She will be placed in observation - She was given Zofran  and Phenergan  in the emergency room with still persistent symptoms. - She will be given some IV fluid, antinausea medications - Will send ova and parasite from the stool. - The ED send stool for C. difficile colitis.  2.  Hypertension - Will resume her home medications for blood pressure - Continue to monitor blood pressure  3.  Rheumatoid arthritis/Sjogren's syndrome - Resume home medications  4.  Mild cognitive impairment - Supportive care  5.  Depression - Resume home medications - There is no signs of suicidal ideation or homicidal thoughts    DVT prophylaxis: Lovenox  Code Status: Full Code Family Communication: Friend and patient's son was at bedside Disposition Plan: Home Consults called: None Admission status: Observation, Med-Surg   Nena Rebel, MD [facility] 02/02/2024, 5:18 PM

## 2024-02-02 NOTE — ED Triage Notes (Signed)
 Pt brought in from home by EMS for vomiting & diarrhea x 1 day. Denies any c/o pain.

## 2024-02-02 NOTE — ED Provider Notes (Signed)
 [facility] Provider Note    Event Date/Time   First MD Initiated Contact with Patient 02/02/24 [phone]     (approximate)   History   Nausea   HPI  Toni Myers is a 73 y.o. female who presents with nausea vomiting and diarrhea which started yesterday.  She denies abdominal pain.  She reports primarily nausea and vomiting and a couple of loose stools, nonbloody.  EMS gave 4 of Zofran  IV     Physical Exam   Triage Vital Signs: ED Triage Vitals  Encounter Vitals Group     BP 02/02/24 0927 (!) 164/99     Girls Systolic BP Percentile --      Girls Diastolic BP Percentile --      Boys Systolic BP Percentile --      Boys Diastolic BP Percentile --      Pulse Rate 02/02/24 0927 90     Resp 02/02/24 0927 (!) 21     Temp 02/02/24 0927 98.9 F (37.2 C)     Temp Source 02/02/24 0927 Oral     SpO2 02/02/24 0927 100 %     Weight 02/02/24 0924 52.4 kg (115 lb 8 oz)     Height 02/02/24 0924 1.626 m (5' 4)     Head Circumference --      Peak Flow --      Pain Score 02/02/24 0924 0     Pain Loc --      Pain Education --      Exclude from Growth Chart --     Most recent vital signs: Vitals:   02/02/24 1130 02/02/24 1338  BP: (!) 146/89 (!) 142/97  Pulse: 99 (!) 104  Resp: 12 18  Temp:    SpO2: 100% 99%     General: Awake, no distress.  CV:  Good peripheral perfusion.  Resp:  Normal effort.  Abd:  No distention.  Soft, nontender, reassuring exam Other:     ED Results / Procedures / Treatments   Labs (all labs ordered are listed, but only abnormal results are displayed) Labs Reviewed  COMPREHENSIVE METABOLIC PANEL WITH GFR - Abnormal; Notable for the following components:      Result Value   Glucose, Bld 115 (*)    Total Protein 8.3 (*)    Total Bilirubin 2.4 (*)    All other components within normal limits  CBC  LIPASE, BLOOD  CBC  CREATININE, SERUM     EKG     RADIOLOGY     PROCEDURES:  Critical Care performed:    Procedures   MEDICATIONS ORDERED IN ED: Medications  enoxaparin  (LOVENOX ) injection 40 mg (has no administration in time range)  acetaminophen  (TYLENOL ) tablet 650 mg (has no administration in time range)    Or  acetaminophen  (TYLENOL ) suppository 650 mg (has no administration in time range)  polyethylene glycol (MIRALAX  / GLYCOLAX ) packet 17 g (has no administration in time range)  ondansetron  (ZOFRAN ) tablet 4 mg (has no administration in time range)    Or  ondansetron  (ZOFRAN ) injection 4 mg (has no administration in time range)  0.9 %  sodium chloride  infusion (0 mLs Intravenous Stopped 02/02/24 1138)  ondansetron  (ZOFRAN ) injection 4 mg (4 mg Intravenous Given 02/02/24 1144)  sodium chloride  0.9 % bolus 500 mL (0 mLs Intravenous Stopped 02/02/24 1356)  promethazine  (PHENERGAN ) 12.5 mg in sodium chloride  0.9 % 50 mL IVPB (0 mg Intravenous Stopped 02/02/24 1356)     IMPRESSION / MDM /  ASSESSMENT AND PLAN / ED COURSE  I reviewed the triage vital signs and the nursing notes. Patient's presentation is most consistent with acute illness / injury with system symptoms.  Patient presents with nausea vomiting diarrhea as detailed above, differential includes viral gastroenteritis versus foodborne illness, reassuring abdominal exam, will treat with IV fluids, obtain labs and reevaluate.  Lab work is overall reassuring patient with continued nausea after first dose of Zofran  by EMS, will give a second dose   ----------------------------------------- 12:39 PM on 02/02/2024 ----------------------------------------- Nurse for still feeling nauseated, will try IV Phenergan    ----------------------------------------- 1:53 PM on 02/02/2024 ----------------------------------------- Patient still with nausea and vomiting, will consult hospitalist team for admission given that she is still tachycardic, she continues to not have any abdominal pain   FINAL CLINICAL IMPRESSION(S) / ED DIAGNOSES    Final diagnoses:  Intractable nausea and vomiting     Rx / DC Orders   ED Discharge Orders     None        Note:  This document was prepared using Dragon voice recognition software and may include unintentional dictation errors.   Arlander Charleston, MD 02/02/24 [phone]

## 2024-02-03 DIAGNOSIS — K529 Noninfective gastroenteritis and colitis, unspecified: Secondary | ICD-10-CM | POA: Diagnosis not present

## 2024-02-03 DIAGNOSIS — I129 Hypertensive chronic kidney disease with stage 1 through stage 4 chronic kidney disease, or unspecified chronic kidney disease: Secondary | ICD-10-CM | POA: Diagnosis not present

## 2024-02-03 DIAGNOSIS — Z8261 Family history of arthritis: Secondary | ICD-10-CM | POA: Diagnosis not present

## 2024-02-03 DIAGNOSIS — M069 Rheumatoid arthritis, unspecified: Secondary | ICD-10-CM | POA: Diagnosis not present

## 2024-02-03 DIAGNOSIS — N182 Chronic kidney disease, stage 2 (mild): Secondary | ICD-10-CM | POA: Diagnosis not present

## 2024-02-03 DIAGNOSIS — F32A Depression, unspecified: Secondary | ICD-10-CM | POA: Diagnosis not present

## 2024-02-03 DIAGNOSIS — Z79899 Other long term (current) drug therapy: Secondary | ICD-10-CM | POA: Diagnosis not present

## 2024-02-03 DIAGNOSIS — F419 Anxiety disorder, unspecified: Secondary | ICD-10-CM | POA: Diagnosis not present

## 2024-02-03 DIAGNOSIS — Z96641 Presence of right artificial hip joint: Secondary | ICD-10-CM | POA: Diagnosis not present

## 2024-02-03 DIAGNOSIS — J4489 Other specified chronic obstructive pulmonary disease: Secondary | ICD-10-CM | POA: Diagnosis not present

## 2024-02-03 DIAGNOSIS — F0393 Unspecified dementia, unspecified severity, with mood disturbance: Secondary | ICD-10-CM | POA: Diagnosis not present

## 2024-02-03 DIAGNOSIS — Z7951 Long term (current) use of inhaled steroids: Secondary | ICD-10-CM | POA: Diagnosis not present

## 2024-02-03 DIAGNOSIS — M3500 Sicca syndrome, unspecified: Secondary | ICD-10-CM | POA: Diagnosis not present

## 2024-02-03 DIAGNOSIS — Z888 Allergy status to other drugs, medicaments and biological substances status: Secondary | ICD-10-CM | POA: Diagnosis not present

## 2024-02-03 DIAGNOSIS — K219 Gastro-esophageal reflux disease without esophagitis: Secondary | ICD-10-CM | POA: Diagnosis not present

## 2024-02-03 DIAGNOSIS — Z885 Allergy status to narcotic agent status: Secondary | ICD-10-CM | POA: Diagnosis not present

## 2024-02-03 DIAGNOSIS — G2581 Restless legs syndrome: Secondary | ICD-10-CM | POA: Diagnosis not present

## 2024-02-03 LAB — COMPREHENSIVE METABOLIC PANEL WITH GFR
ALT: 9 U/L (ref 0–44)
AST: 24 U/L (ref 15–41)
Albumin: 4.2 g/dL (ref 3.5–5.0)
Alkaline Phosphatase: 43 U/L (ref 38–126)
Anion gap: 12 (ref 5–15)
BUN: 8 mg/dL (ref 8–23)
CO2: 24 mmol/L (ref 22–32)
Calcium: 8.5 mg/dL — ABNORMAL LOW (ref 8.9–10.3)
Chloride: 104 mmol/L (ref 98–111)
Creatinine, Ser: 0.62 mg/dL (ref 0.44–1.00)
GFR, Estimated: >60 mL/min (ref >60)
Glucose, Bld: 94 mg/dL (ref 70–99)
Potassium: 3.4 mmol/L — ABNORMAL LOW (ref 3.5–5.1)
Sodium: 140 mmol/L (ref 135–145)
Total Bilirubin: 1.8 mg/dL — ABNORMAL HIGH (ref 0.0–1.2)
Total Protein: 6.8 g/dL (ref 6.5–8.1)

## 2024-02-03 LAB — CBC
HCT: 41.7 % (ref 36.0–46.0)
Hemoglobin: 13.7 g/dL (ref 12.0–15.0)
MCH: 30.2 pg (ref 26.0–34.0)
MCHC: 32.9 g/dL (ref 30.0–36.0)
MCV: 92.1 fL (ref 80.0–100.0)
Platelets: 284 K/uL (ref 150–400)
RBC: 4.53 MIL/uL (ref 3.87–5.11)
RDW: 13.2 % (ref 11.5–15.5)
WBC: 4.5 K/uL (ref 4.0–10.5)
nRBC: 0 % (ref 0.0–0.2)

## 2024-02-03 LAB — PROTIME-INR
INR: 1 (ref 0.8–1.2)
Prothrombin Time: 13.7 s (ref 11.4–15.2)

## 2024-02-03 LAB — MAGNESIUM: Magnesium: 2 mg/dL (ref 1.7–2.4)

## 2024-02-03 MED ORDER — SODIUM CHLORIDE 0.9 % IV SOLN
12.5000 mg | Freq: Four times a day (QID) | INTRAVENOUS | Status: DC | PRN
  Administered 2024-02-03: 12.5 mg via INTRAVENOUS
  Filled 2024-02-03: qty 12.5

## 2024-02-03 MED ORDER — POTASSIUM CHLORIDE CRYS ER 20 MEQ PO TBCR
40.0000 meq | EXTENDED_RELEASE_TABLET | Freq: Once | ORAL | Status: AC
Start: 2024-02-03 — End: 2024-02-03
  Administered 2024-02-03: 40 meq via ORAL
  Filled 2024-02-03: qty 2

## 2024-02-03 NOTE — Progress Notes (Signed)
 Progress Note    Toni Myers  FMW:[id number] DOB: 16-Jun-1950  DOA: 02/02/2024 PCP: Montey Lot, PA-C      Brief Narrative:    Medical records reviewed and are as summarized below:  Toni Myers is a 73 y.o. female with medical history significant for COPD, hypertension anxiety, depression, mild cognitive impairment, who presented to the hospital with nausea, vomiting and diarrhea that started on the evening of 01/31/2024.  She said she had eaten some chicken at her daughter's house.  She went home and later developed nausea vomiting and diarrhea.  She has had multiple watery stools.  She came to the ED because of worsening symptoms.     Assessment/Plan:   Principal Problem:   Acute gastroenteritis Active Problems:   Essential hypertension   Depression   RLS (restless legs syndrome)   Rheumatoid arthritis (HCC)   Dementia without behavioral disturbance (HCC)   Gastroesophageal reflux disease   Body mass index is 19.83 kg/m.    Acute gastroenteritis, nausea, vomiting and diarrhea: Diarrhea is better.  She is still having nausea and vomiting.  Antiemetics as needed.  Continue IV fluids for hydration.  Advance diet as tolerated.   Rheumatoid arthritis/Sjogren's syndrome: Continue hydroxychloroquine    Comorbidities include mild cognitive impairment, depression, hypertension and restless leg syndrome      Diet Order             Diet Heart Fluid consistency: Thin  Diet effective now                                  Consultants: None  Procedures: None    Medications:    amLODipine   2.5 mg Oral Daily   DULoxetine   60 mg Oral Daily   enoxaparin  (LOVENOX ) injection  40 mg Subcutaneous Q24H   feeding supplement  1 Container Oral TID BM   hydroxychloroquine   200 mg Oral Daily   metoCLOPramide  (REGLAN ) injection  10 mg Intravenous TID AC & HS   metoprolol  succinate  100 mg Oral Daily   Continuous Infusions:  sodium chloride  100  mL/hr at 02/03/24 0535     Anti-infectives (From admission, onward)    Start     Dose/Rate Route Frequency Ordered Stop   02/03/24 1000  hydroxychloroquine  (PLAQUENIL ) tablet 200 mg        200 mg Oral Daily 02/02/24 1714                Family Communication/Anticipated D/C date and plan/Code Status   DVT prophylaxis: enoxaparin  (LOVENOX ) injection 40 mg Start: 02/02/24 2200 SCDs Start: 02/02/24 1412     Code Status: Full Code  Family Communication: None Disposition Plan: Plan to discharge home   Status is: Observation The patient will require care spanning > 2 midnights and should be moved to inpatient because: Refractory nausea and vomiting       Subjective:   Interval events noted.  She complains of abdominal pain, nausea and vomiting.  She said she vomited about twice overnight.  No diarrhea.    Objective:    Vitals:   02/02/24 1637 02/02/24 1926 02/03/24 0337 02/03/24 0832  BP: (!) 149/97 (!) 136/99 (!) 140/88 (!) 146/91  Pulse: (!) 107 95 94 (!) 103  Resp: 16 18 16 16   Temp: 98.6 F (37 C) 98.5 F (36.9 C) 97.9 F (36.6 C) 97.9 F (36.6 C)  TempSrc: Oral Oral Oral  SpO2: 100% 99% 100% 100%  Weight:      Height:       No data found.   Intake/Output Summary (Last 24 hours) at 02/03/2024 0858 Last data filed at 02/03/2024 0535 Gross per 24 hour  Intake 1196.58 ml  Output --  Net 1196.58 ml   Filed Weights   02/02/24 0924  Weight: 52.4 kg    Exam:  GEN: NAD SKIN: Warm and dry EYES: No pallor or icterus ENT: MMM CV: RRR PULM: CTA B ABD: soft, ND, mild mid abdominal tenderness, no rebound tenderness or guarding, +BS CNS: AAO x 3, non focal EXT: No edema or tenderness        Data Reviewed:   I have personally reviewed following labs and imaging studies:  Labs: Labs show the following:   Basic Metabolic Panel: Recent Labs  Lab 02/02/24 0934 02/03/24 0614  NA 137 140  K 3.5 3.4*  CL 99 104  CO2 25 24  GLUCOSE 115*  94  BUN 11 8  CREATININE 0.61 0.62  CALCIUM  9.8 8.5*   GFR Estimated Creatinine Clearance: 50.3 mL/min (by C-G formula based on SCr of 0.62 mg/dL). Liver Function Tests: Recent Labs  Lab 02/02/24 0934 02/03/24 0614  AST 25 24  ALT 11 9  ALKPHOS 59 43  BILITOT 2.4* 1.8*  PROT 8.3* 6.8  ALBUMIN 4.5 4.2   Recent Labs  Lab 02/02/24 0934  LIPASE 33   No results for input(s): AMMONIA in the last 168 hours. Coagulation profile Recent Labs  Lab 02/03/24 0614  INR 1.0    CBC: Recent Labs  Lab 02/02/24 0934 02/03/24 0614  WBC 5.9 4.5  HGB 15.0 13.7  HCT 44.4 41.7  MCV 89.9 92.1  PLT 325 284   Cardiac Enzymes: No results for input(s): CKTOTAL, CKMB, CKMBINDEX, TROPONINI in the last 168 hours. BNP (last 3 results) No results for input(s): PROBNP in the last 8760 hours. CBG: No results for input(s): GLUCAP in the last 168 hours. D-Dimer: No results for input(s): DDIMER in the last 72 hours. Hgb A1c: No results for input(s): HGBA1C in the last 72 hours. Lipid Profile: No results for input(s): CHOL, HDL, LDLCALC, TRIG, CHOLHDL, LDLDIRECT in the last 72 hours. Thyroid  function studies: No results for input(s): TSH, T4TOTAL, T3FREE, THYROIDAB in the last 72 hours.  Invalid input(s): FREET3 Anemia work up: No results for input(s): VITAMINB12, FOLATE, FERRITIN, TIBC, IRON, RETICCTPCT in the last 72 hours. Sepsis Labs: Recent Labs  Lab 02/02/24 0934 02/03/24 0614  WBC 5.9 4.5    Microbiology No results found for this or any previous visit (from the past 240 hours).  Procedures and diagnostic studies:  No results found.             LOS: 0 days   Cash Duce  [facility]   Pager on [email or web address]. If 7PM-7AM, please contact night-coverage at [email or web address]     02/03/2024, 8:58 AM

## 2024-02-03 NOTE — Plan of Care (Signed)

## 2024-02-04 DIAGNOSIS — K529 Noninfective gastroenteritis and colitis, unspecified: Secondary | ICD-10-CM | POA: Diagnosis not present

## 2024-02-04 LAB — BASIC METABOLIC PANEL WITH GFR
Anion gap: 6 (ref 5–15)
BUN: 6 mg/dL — ABNORMAL LOW (ref 8–23)
CO2: 26 mmol/L (ref 22–32)
Calcium: 8.8 mg/dL — ABNORMAL LOW (ref 8.9–10.3)
Chloride: 105 mmol/L (ref 98–111)
Creatinine, Ser: 0.61 mg/dL (ref 0.44–1.00)
GFR, Estimated: >60 mL/min (ref >60)
Glucose, Bld: 96 mg/dL (ref 70–99)
Potassium: 4.2 mmol/L (ref 3.5–5.1)
Sodium: 137 mmol/L (ref 135–145)

## 2024-02-04 NOTE — Plan of Care (Signed)

## 2024-02-04 NOTE — Discharge Summary (Signed)
 Physician Discharge Summary   Patient: Toni Myers MRN: [id number] DOB: 04/11/1951  Admit date:     02/02/2024  Discharge date: 02/04/24  Discharge Physician: AIDA CHO   PCP: Montey Lot, PA-C   Recommendations at discharge:   Follow-up with PCP in 1 to 2 weeks  Discharge Diagnoses: Principal Problem:   Acute gastroenteritis Active Problems:   Essential hypertension   Depression   RLS (restless legs syndrome)   Rheumatoid arthritis (HCC)   Dementia without behavioral disturbance (HCC)   Gastroesophageal reflux disease  Resolved Problems:   * No resolved hospital problems. *  Hospital Course:  Toni Myers is a 73 y.o. female with medical history significant for COPD, hypertension anxiety, depression, mild cognitive impairment, who presented to the hospital with nausea, vomiting and diarrhea that started on the evening of 01/31/2024.  She said she had eaten some chicken at her daughter's house.  She went home and later developed nausea vomiting and diarrhea.  She has had multiple watery stools.  She came to the ED because of worsening symptoms.    Assessment and Plan:  Acute gastroenteritis, nausea, vomiting and diarrhea: All her symptoms have resolved.  She is feeling better and she is tolerating a regular diet.     Rheumatoid arthritis/Sjogren's syndrome: Continue hydroxychloroquine      Comorbidities include mild cognitive impairment, depression, hypertension and restless leg syndrome   Her condition has improved and she is deemed stable for discharge to home today.  We went over her home medications and she was able to tell me the medications that she no longer takes and these were discontinued at discharge.       Consultants: None Procedures performed: None Disposition: Home Diet recommendation:  Discharge Diet Orders (From admission, onward)     Start     Ordered   02/04/24 0000  Diet - low sodium heart healthy        02/04/24 0918            Cardiac diet DISCHARGE MEDICATION: Allergies as of 02/04/2024       Reactions   Morphine And Codeine Nausea And Vomiting   makes me sick at high doses; doesn't like to take opioids   Prednisone  Other (See Comments)   Causes insomnia by the 3rd day of taking.prednisone  taper.        Medication List     STOP taking these medications    cyclobenzaprine  10 MG tablet Commonly known as: FLEXERIL    DULoxetine  60 MG capsule Commonly known as: CYMBALTA    escitalopram  10 MG tablet Commonly known as: LEXAPRO    folic acid  1 MG tablet Commonly known as: FOLVITE    methocarbamol  500 MG tablet Commonly known as: ROBAXIN    oxyCODONE  5 MG immediate release tablet Commonly known as: Oxy IR/ROXICODONE    Paxlovid  (300/100) 20 x 150 MG & 10 x 100MG  Tbpk Generic drug: nirmatrelvir /ritonavir        TAKE these medications    acetaminophen  500 MG tablet Commonly known as: TYLENOL  Take by mouth.   albuterol  (2.5 MG/3ML) 0.083% nebulizer solution Commonly known as: PROVENTIL  Take 2.5 mg by nebulization every 4 (four) hours as needed for wheezing or shortness of breath. Reported on 10/22/2015   albuterol  108 (90 Base) MCG/ACT inhaler Commonly known as: VENTOLIN  HFA Inhale 2 puffs into the lungs every 6 (six) hours as needed for wheezing or shortness of breath.   amLODipine  2.5 MG tablet Commonly known as: NORVASC  Take 2.5 mg by mouth daily.   budesonide -formoterol   160-4.5 MCG/ACT inhaler Commonly known as: SYMBICORT  Inhale 2 puffs into the lungs 2 (two) times daily.   hydroxychloroquine  200 MG tablet Commonly known as: PLAQUENIL  Take 200 mg by mouth daily.   loratadine  10 MG tablet Commonly known as: CLARITIN  Take 10 mg by mouth daily as needed for allergies.   metoprolol  succinate 100 MG 24 hr tablet Commonly known as: TOPROL -XL Take 1 tablet (100 mg total) by mouth daily. Take with or immediately following a meal.   multivitamin tablet Take 1 tablet by mouth  daily.        Discharge Exam: Filed Weights   02/02/24 0924  Weight: 52.4 kg   GEN: NAD SKIN: Warm and dry EYES: No pallor or icterus ENT: MMM CV: RRR PULM: CTA B ABD: soft, ND, NT, +BS CNS: AAO x 3, non focal EXT: No edema or tenderness   Condition at discharge: good  The results of significant diagnostics from this hospitalization (including imaging, microbiology, ancillary and laboratory) are listed below for reference.   Imaging Studies: MR BRAIN WO CONTRAST Result Date: 01/28/2024 EXAM: MR Brain without Intravenous Contrast. CLINICAL HISTORY: MCI (mild cognitive impairment) with memory loss. Patient experiencing pain, some hearing loss, and difficulty walking at times. No head injury or surgery. No history of cancer. TECHNIQUE: Magnetic resonance images of the brain without intravenous contrast in multiple planes. CONTRAST: None. COMPARISON: None provided. FINDINGS: BRAIN: Age-related cerebral volume loss present. Confluent zones of increased T2 signal in the periventricular white matter. Scattered foci of increased T2 signal within the subcortical white matter. No restricted diffusion to indicate acute infarction. No intracranial mass or hemorrhage. No midline shift or extra-axial fluid collection. No cerebellar tonsillar ectopia. The central arterial and venous flow voids are patent. VENTRICLES: No hydrocephalus. ORBITS: The patient is status post bilateral lens replacement. SINUSES AND MASTOIDS: The sinuses and mastoid air cells are clear. BONES: No acute fracture or focal osseous lesion. IMPRESSION: 1. Age-related cerebral volume loss. 2. Confluent periventricular and scattered subcortical white matter T2 hyperintensities. Electronically signed by: Evalene Coho MD 01/28/2024 07:37 AM EDT RP Workstation: [id number]    Microbiology: Results for orders placed or performed during the hospital encounter of 01/04/20  SARS Coronavirus 2 by RT PCR (hospital order, performed in  [facility] hospital lab) Nasopharyngeal Nasopharyngeal Swab     Status: None   Collection Time: 01/04/20 12:53 PM   Specimen: Nasopharyngeal Swab  Result Value Ref Range Status   SARS Coronavirus 2 NEGATIVE NEGATIVE Final    Comment: (NOTE) SARS-CoV-2 target nucleic acids are NOT DETECTED.  The SARS-CoV-2 RNA is generally detectable in upper and lower respiratory specimens during the acute phase of infection. The lowest concentration of SARS-CoV-2 viral copies this assay can detect is 250 copies / mL. A negative result does not preclude SARS-CoV-2 infection and should not be used as the sole basis for treatment or other patient management decisions.  A negative result may occur with improper specimen collection / handling, submission of specimen other than nasopharyngeal swab, presence of viral mutation(s) within the areas targeted by this assay, and inadequate number of viral copies (<250 copies / mL). A negative result must be combined with clinical observations, patient history, and epidemiological information.  Fact Sheet for Patients:   BoilerBrush.com.cy  Fact Sheet for Healthcare Providers: https://pope.com/  This test is not yet approved or  cleared by the [place]  FDA and has been authorized for detection and/or diagnosis of SARS-CoV-2 by FDA under an Emergency Use Authorization (EUA).  This EUA will remain in effect (meaning this test can be used) for the duration of the COVID-19 declaration under Section 564(b)(1) of the Act, 21 U.S.C. section 360bbb-3(b)(1), unless the authorization is terminated or revoked sooner.  Performed at [facility], [address], [place], [place] [address]     Labs: CBC: Recent Labs  Lab 02/02/24 0934 02/03/24 0614  WBC 5.9 4.5  HGB 15.0 13.7  HCT 44.4 41.7  MCV 89.9 92.1  PLT 325 284   Basic Metabolic Panel: Recent Labs  Lab 02/02/24 0934  02/03/24 0614 02/04/24 0540  NA 137 140 137  K 3.5 3.4* 4.2  CL 99 104 105  CO2 25 24 26   GLUCOSE 115* 94 96  BUN 11 8 6*  CREATININE 0.61 0.62 0.61  CALCIUM  9.8 8.5* 8.8*  MG  --  2.0  --    Liver Function Tests: Recent Labs  Lab 02/02/24 0934 02/03/24 0614  AST 25 24  ALT 11 9  ALKPHOS 59 43  BILITOT 2.4* 1.8*  PROT 8.3* 6.8  ALBUMIN 4.5 4.2   CBG: No results for input(s): GLUCAP in the last 168 hours.  Discharge time spent: greater than 30 minutes.  Signed: AIDA CHO, MD [facility] 02/04/2024

## 2024-02-04 NOTE — Progress Notes (Signed)
 Ignacia JAYSON Sink to be D/C'd Home per MD order.  Discussed with the patient and all questions fully answered.  VSS, Skin clean, dry and intact without evidence of skin break down, no evidence of skin tears noted. IV catheter discontinued intact. Site without signs and symptoms of complications. Dressing and pressure applied.  An After Visit Summary was printed and given to the patient.   D/c education completed with patient/family including follow up instructions, medication list, d/c activities limitations if indicated, with other d/c instructions as indicated by MD - patient able to verbalize understanding, all questions fully answered.   Patient instructed to return to ED, call 911, or call MD for any changes in condition.   Patient escorted via WC, and D/C home via private auto.  Ileana LITTIE Gainer 02/04/2024 11:02 AM

## 2024-02-12 DIAGNOSIS — K529 Noninfective gastroenteritis and colitis, unspecified: Secondary | ICD-10-CM | POA: Diagnosis not present

## 2024-02-12 DIAGNOSIS — F418 Other specified anxiety disorders: Secondary | ICD-10-CM | POA: Diagnosis not present

## 2024-02-12 DIAGNOSIS — I10 Essential (primary) hypertension: Secondary | ICD-10-CM | POA: Diagnosis not present

## 2024-02-12 DIAGNOSIS — M3500 Sicca syndrome, unspecified: Secondary | ICD-10-CM | POA: Diagnosis not present

## 2024-02-12 DIAGNOSIS — G3184 Mild cognitive impairment, so stated: Secondary | ICD-10-CM | POA: Diagnosis not present

## 2024-02-23 DIAGNOSIS — I10 Essential (primary) hypertension: Secondary | ICD-10-CM | POA: Diagnosis not present

## 2024-02-23 DIAGNOSIS — G3184 Mild cognitive impairment, so stated: Secondary | ICD-10-CM | POA: Diagnosis not present

## 2024-02-23 DIAGNOSIS — R1110 Vomiting, unspecified: Secondary | ICD-10-CM | POA: Diagnosis not present

## 2024-02-23 DIAGNOSIS — R000 Tachycardia, unspecified: Secondary | ICD-10-CM | POA: Diagnosis not present

## 2024-03-13 DIAGNOSIS — N390 Urinary tract infection, site not specified: Secondary | ICD-10-CM | POA: Diagnosis not present

## 2024-03-25 DIAGNOSIS — N390 Urinary tract infection, site not specified: Secondary | ICD-10-CM | POA: Diagnosis not present

## 2024-03-25 DIAGNOSIS — G3184 Mild cognitive impairment, so stated: Secondary | ICD-10-CM | POA: Diagnosis not present

## 2024-04-12 ENCOUNTER — Other Ambulatory Visit: Payer: Self-pay | Admitting: Obstetrics and Gynecology

## 2024-04-12 DIAGNOSIS — Z1231 Encounter for screening mammogram for malignant neoplasm of breast: Secondary | ICD-10-CM

## 2024-04-26 DIAGNOSIS — J449 Chronic obstructive pulmonary disease, unspecified: Secondary | ICD-10-CM | POA: Diagnosis not present

## 2024-04-26 DIAGNOSIS — J984 Other disorders of lung: Secondary | ICD-10-CM | POA: Diagnosis not present

## 2024-04-26 DIAGNOSIS — G3184 Mild cognitive impairment, so stated: Secondary | ICD-10-CM | POA: Diagnosis not present

## 2024-04-26 DIAGNOSIS — R000 Tachycardia, unspecified: Secondary | ICD-10-CM | POA: Diagnosis not present

## 2024-04-26 DIAGNOSIS — I10 Essential (primary) hypertension: Secondary | ICD-10-CM | POA: Diagnosis not present

## 2024-04-26 DIAGNOSIS — M810 Age-related osteoporosis without current pathological fracture: Secondary | ICD-10-CM | POA: Diagnosis not present

## 2024-04-26 DIAGNOSIS — J439 Emphysema, unspecified: Secondary | ICD-10-CM | POA: Diagnosis not present

## 2024-04-26 DIAGNOSIS — Z23 Encounter for immunization: Secondary | ICD-10-CM | POA: Diagnosis not present

## 2024-05-21 ENCOUNTER — Encounter: Payer: Self-pay | Admitting: Emergency Medicine

## 2024-05-21 ENCOUNTER — Other Ambulatory Visit: Payer: Self-pay

## 2024-05-21 ENCOUNTER — Emergency Department
Admission: EM | Admit: 2024-05-21 | Discharge: 2024-05-21 | Disposition: A | Discharge status: Home / Self Care | Attending: Emergency Medicine | Admitting: Emergency Medicine

## 2024-05-21 ENCOUNTER — Emergency Department

## 2024-05-21 DIAGNOSIS — I10 Essential (primary) hypertension: Secondary | ICD-10-CM | POA: Insufficient documentation

## 2024-05-21 DIAGNOSIS — R002 Palpitations: Secondary | ICD-10-CM | POA: Insufficient documentation

## 2024-05-21 DIAGNOSIS — R202 Paresthesia of skin: Secondary | ICD-10-CM | POA: Diagnosis not present

## 2024-05-21 DIAGNOSIS — R0789 Other chest pain: Secondary | ICD-10-CM | POA: Insufficient documentation

## 2024-05-21 DIAGNOSIS — R079 Chest pain, unspecified: Secondary | ICD-10-CM

## 2024-05-21 LAB — CBC
HCT: 41.1 % (ref 36.0–46.0)
Hemoglobin: 13.7 g/dL (ref 12.0–15.0)
MCH: 30.4 pg (ref 26.0–34.0)
MCHC: 33.3 g/dL (ref 30.0–36.0)
MCV: 91.1 fL (ref 80.0–100.0)
Platelets: 316 K/uL (ref 150–400)
RBC: 4.51 MIL/uL (ref 3.87–5.11)
RDW: 13.6 % (ref 11.5–15.5)
WBC: 3.5 K/uL — ABNORMAL LOW (ref 4.0–10.5)
nRBC: 0 % (ref 0.0–0.2)

## 2024-05-21 LAB — TROPONIN T, HIGH SENSITIVITY: Troponin T High Sensitivity: <15 ng/L (ref 0–19)

## 2024-05-21 LAB — BASIC METABOLIC PANEL WITH GFR
Anion gap: 10 (ref 5–15)
BUN: 11 mg/dL (ref 8–23)
CO2: 27 mmol/L (ref 22–32)
Calcium: 9.5 mg/dL (ref 8.9–10.3)
Chloride: 103 mmol/L (ref 98–111)
Creatinine, Ser: 0.64 mg/dL (ref 0.44–1.00)
GFR, Estimated: >60 mL/min
Glucose, Bld: 102 mg/dL — ABNORMAL HIGH (ref 70–99)
Potassium: 4.3 mmol/L (ref 3.5–5.1)
Sodium: 140 mmol/L (ref 135–145)

## 2024-05-21 NOTE — ED Provider Notes (Signed)
 "  [facility] Provider Note    Event Date/Time   First MD Initiated Contact with Patient 05/21/24 1039     (approximate)   History   Chest Pain   HPI  Toni Myers is a 73 y.o. female past medical history significant for rheumatoid arthritis, hypertension, who presents to the emergency department with chest pain.  Patient states that she has been having intermittent episodes of mild chest pain over the past couple of weeks.  Had some worsening chest pain last night and some tingling to her left arm which brought her into the emergency department.  States she believes that it is secondary to a lot of stress but she told her friend about it today who wanted her to come into the emergency department.  States significant stress given that her daughter has been diagnosed with breast cancer.  Denies any falls or trauma.  Denies any history of DVT or PE.  Denies any shortness of breath or chest pain that is worse with deep inspiration.  Denies any tearing chest pain.  No nausea or vomiting.  Does feel that the chest pain gets worse at nighttime when she lays down.  No edema in her legs no swelling.  No recent travel.  No prior stress testing or cardiac catheterization.      Physical Exam   Triage Vital Signs: ED Triage Vitals  Encounter Vitals Group     BP 05/21/24 0950 (!) 152/107     Girls Systolic BP Percentile --      Girls Diastolic BP Percentile --      Boys Systolic BP Percentile --      Boys Diastolic BP Percentile --      Pulse Rate 05/21/24 0950 97     Resp 05/21/24 0950 16     Temp 05/21/24 0954 97.9 F (36.6 C)     Temp Source 05/21/24 0950 Oral     SpO2 05/21/24 0950 98 %     Weight 05/21/24 0951 115 lb 8.3 oz (52.4 kg)     Height --      Head Circumference --      Peak Flow --      Pain Score 05/21/24 0950 6     Pain Loc --      Pain Education --      Exclude from Growth Chart --     Most recent vital signs: Vitals:   05/21/24 0954  05/21/24 1050  BP:  (!) 150/100  Pulse:  90  Resp:  16  Temp: 97.9 F (36.6 C)   SpO2:      Physical Exam Constitutional:      Appearance: She is well-developed.  HENT:     Head: Atraumatic.  Eyes:     Conjunctiva/sclera: Conjunctivae normal.  Cardiovascular:     Rate and Rhythm: Regular rhythm.     Pulses:          Radial pulses are 2+ on the right side and 2+ on the left side.       Dorsalis pedis pulses are 2+ on the right side and 2+ on the left side.     Heart sounds: Normal heart sounds.  Pulmonary:     Effort: No respiratory distress.  Abdominal:     General: There is no distension.     Palpations: Abdomen is soft.     Tenderness: There is no abdominal tenderness.  Musculoskeletal:        General: Normal  range of motion.     Cervical back: Normal range of motion.     Right lower leg: No edema.     Left lower leg: No edema.  Skin:    General: Skin is warm.     Capillary Refill: Capillary refill takes less than 2 seconds.  Neurological:     Mental Status: She is alert. Mental status is at baseline.     IMPRESSION / MDM / ASSESSMENT AND PLAN / ED COURSE  I reviewed the triage vital signs and the nursing notes.  Differential diagnosis including ACS, anemia, pneumonia, stress, anxiety, pulmonary embolism, dissection, pericarditis  EKG  I, Clotilda Punter, the attending physician, personally viewed and interpreted this ECG.  EKG showed normal sinus rhythm.  Atrial enlargement.  No significant ST elevation or depression.  No findings of acute ischemia or dysrhythmia.  Normal QTc.  RADIOLOGY I independently reviewed imaging, my interpretation of imaging: Chest x-ray no signs of pneumonia, pulmonary edema or pneumothorax  LABS (all labs ordered are listed, but only abnormal results are displayed) Labs interpreted as -    Labs Reviewed  BASIC METABOLIC PANEL WITH GFR - Abnormal; Notable for the following components:      Result Value   Glucose, Bld 102 (*)     All other components within normal limits  CBC - Abnormal; Notable for the following components:   WBC 3.5 (*)    All other components within normal limits  TROPONIN T, HIGH SENSITIVITY     MDM  Lab work with mild leukopenia 3.5.  No other significant lab work abnormality.  No significant electrolyte abnormalities.  No signs of anemia.  Troponin is undetectable.  Given that her chest pain has been ongoing since last night, have a low suspicion for ACS and do not feel that a second troponin is necessary at this time.  Low risk heart score.  Low suspicion for dissection, no tearing chest pain, pulses are equal and were symmetric.  Low suspicion for pericarditis, no EKG changes and rub on exam, is positional but is worse laying down at nighttime only.  Low suspicion for pulmonary embolism, no shortness of breath or pleuritic nature of her chest pain.  Given her significant stress and anxiety most likely stress, possible acid reflux.  Discussed close follow-up with her primary care physician and return to the emergency department if she had any ongoing or worsening symptoms.  Patient states that she will reach out to her cardiologist.  Discussed calling her primary care physician for close follow-up.  No questions or concerns at time of discharge.     PROCEDURES:  Critical Care performed: No  Procedures  Patient's presentation is most consistent with acute presentation with potential threat to life or bodily function.   MEDICATIONS ORDERED IN ED: Medications - No data to display  FINAL CLINICAL IMPRESSION(S) / ED DIAGNOSES   Final diagnoses:  Chest pain, unspecified type  Palpitations     Rx / DC Orders   ED Discharge Orders          Ordered    Ambulatory referral to Cardiology       Comments: If you have not heard from the Cardiology office within the next 72 hours please call [phone].   05/21/24 1102             Note:  This document was prepared using Dragon  voice recognition software and may include unintentional dictation errors.   Punter Clotilda, MD 05/21/24 1836  "

## 2024-05-21 NOTE — ED Triage Notes (Signed)
 C/O chest pain central chest pain, sob. Tingling to left arm since overnight. Reports being under a lot of stress.  AAOx3. Skin warm and dry. NAD

## 2024-05-21 NOTE — ED Notes (Signed)
 See triage note  Presents with some chest discomfort  States pain is mid chest and having some SOB Afebrile on arrival

## 2024-06-02 DIAGNOSIS — R079 Chest pain, unspecified: Secondary | ICD-10-CM | POA: Insufficient documentation

## 2024-06-02 NOTE — Progress Notes (Unsigned)
 Cardiology Office Note  Date:  06/02/2024   ID:  PAYTAN RECINE, DOB December 13, 1950, MRN [id number]  PCP:  Montey Lot, PA-C   No chief complaint on file.   HPI:  Toni Myers is a 74 y.o. female with past medical history of: Past Medical History:  Diagnosis Date   Anxiety    Arthritis    Rheumatoid   Asthma    Chronic iritis, left eye    Chronic low back pain    CKD (chronic kidney disease), stage II 10/08/2015   Closed fracture of multiple pubic rami (HCC) 07/19/2013   Closed fracture of pelvic rim 07/19/2013   Cough    Depression    Dyspnea    Dysrhythmia    resolved   GERD (gastroesophageal reflux disease)    Hypertension    Migraine    none in several years   Palpitations    RLS (restless legs syndrome)    Shingles    Sjogrens syndrome   Who presents by referral from Dr. Clotilda Punter for chest pain, palpitations  Seen in the emergency room May 21, 2024 Chest pain intermittent episodes of mild chest pain over the past couple of weeks. Had some worsening chest pain last night and some tingling to her left arm which brought her into the emergency department. States she believes that it is secondary to a lot of stress  diagnosed with breast cancer.  Workup negative  PMH:   has a past medical history of Anxiety, Arthritis, Asthma, Chronic iritis, left eye, Chronic low back pain, CKD (chronic kidney disease), stage II (10/08/2015), Closed fracture of multiple pubic rami (HCC) (07/19/2013), Closed fracture of pelvic rim (07/19/2013), Cough, Depression, Dyspnea, Dysrhythmia, GERD (gastroesophageal reflux disease), Hypertension, Migraine, Palpitations, RLS (restless legs syndrome), Shingles, and Sjogrens syndrome.   PSH:    Past Surgical History:  Procedure Laterality Date   BACK SURGERY     07/1998 and 03/1999, herniated disc, 2 rod and 4 screws   COLONOSCOPY WITH PROPOFOL  N/A 02/09/2015   Procedure: COLONOSCOPY WITH PROPOFOL ;  Surgeon: Gladis MARLA Louder, MD;  Location: [facility]  ENDOSCOPY;  Service: Endoscopy;  Laterality: N/A;   ESOPHAGOGASTRODUODENOSCOPY (EGD) WITH PROPOFOL  N/A 02/09/2015   Procedure: ESOPHAGOGASTRODUODENOSCOPY (EGD) WITH PROPOFOL ;  Surgeon: Gladis MARLA Louder, MD;  Location: [facility] ENDOSCOPY;  Service: Endoscopy;  Laterality: N/A;   EYE SURGERY Bilateral 2020   LAPAROSCOPIC APPENDECTOMY N/A 01/04/2020   Procedure: APPENDECTOMY LAPAROSCOPIC;  Surgeon: Sheldon Standing, MD;  Location: [facility] ORS;  Service: General;  Laterality: N/A;   TOTAL HIP ARTHROPLASTY Right 11/13/2019   Procedure: TOTAL HIP ARTHROPLASTY ANTERIOR APPROACH;  Surgeon: Melodi Lerner, MD;  Location: [facility] ORS;  Service: Orthopedics;  Laterality: Right;    TUBAL LIGATION      Current Outpatient Medications  Medication Sig Dispense Refill   acetaminophen  (TYLENOL ) 500 MG tablet Take by mouth.     albuterol  (PROVENTIL ) (2.5 MG/3ML) 0.083% nebulizer solution Take 2.5 mg by nebulization every 4 (four) hours as needed for wheezing or shortness of breath. Reported on 10/22/2015     albuterol  (VENTOLIN  HFA) 108 (90 Base) MCG/ACT inhaler Inhale 2 puffs into the lungs every 6 (six) hours as needed for wheezing or shortness of breath.      amLODipine  (NORVASC ) 2.5 MG tablet Take 2.5 mg by mouth daily.     budesonide -formoterol  (SYMBICORT ) 160-4.5 MCG/ACT inhaler Inhale 2 puffs into the lungs 2 (two) times daily. 2 Inhaler 0   hydroxychloroquine  (PLAQUENIL ) 200 MG tablet Take 200 mg  by mouth daily.     loratadine  (CLARITIN ) 10 MG tablet Take 10 mg by mouth daily as needed for allergies.     metoprolol  succinate (TOPROL -XL) 100 MG 24 hr tablet Take 1 tablet (100 mg total) by mouth daily. Take with or immediately following a meal. 90 tablet 3   Multiple Vitamin (MULTIVITAMIN) tablet Take 1 tablet by mouth daily.     No current facility-administered medications for this visit.     Allergies:   Morphine and codeine and Prednisone    Social History:  The patient  reports that she has never smoked. She has  never used smokeless tobacco. She reports current alcohol use. She reports that she does not use drugs.   Family History:   family history includes Arthritis in her mother.    Review of Systems: ROS   PHYSICAL EXAM: VS:  There were no vitals taken for this visit. , BMI There is no height or weight on file to calculate BMI. GEN: Well nourished, well developed, in no acute distress HEENT: normal Neck: no JVD, carotid bruits, or masses Cardiac: RRR; no murmurs, rubs, or gallops,no edema  Respiratory:  clear to auscultation bilaterally, normal work of breathing GI: soft, nontender, nondistended, + BS MS: no deformity or atrophy Skin: warm and dry, no rash Neuro:  Strength and sensation are intact Psych: euthymic mood, full affect    Recent Labs: 02/03/2024: ALT 9; Magnesium  2.0 05/21/2024: BUN 11; Creatinine, Ser 0.64; Hemoglobin 13.7; Platelets 316; Potassium 4.3; Sodium 140    Lipid Panel No results found for: CHOL, HDL, LDLCALC, TRIG    Wt Readings from Last 3 Encounters:  05/21/24 115 lb 8.3 oz (52.4 kg)  02/02/24 115 lb 8 oz (52.4 kg)  10/02/23 119 lb 9.6 oz (54.3 kg)       ASSESSMENT AND PLAN:  Problem List Items Addressed This Visit   None    Disposition:   F/U  12 months   Total encounter time more than 30 minutes  Greater than 50% was spent in counseling and coordination of care with the patient    Signed, Velinda Lunger, M.D., Ph.D. [facility] [place], [place] [phone]

## 2024-06-04 ENCOUNTER — Ambulatory Visit: Admitting: Cardiovascular Disease

## 2024-06-04 DIAGNOSIS — R002 Palpitations: Secondary | ICD-10-CM

## 2024-06-04 DIAGNOSIS — I10 Essential (primary) hypertension: Secondary | ICD-10-CM

## 2024-06-04 DIAGNOSIS — R079 Chest pain, unspecified: Secondary | ICD-10-CM

## 2024-06-14 ENCOUNTER — Ambulatory Visit: Admitting: Cardiovascular Disease

## 2024-06-18 ENCOUNTER — Inpatient Hospital Stay: Admission: RE | Admit: 2024-06-18 | Source: Ambulatory Visit

## 2024-06-20 NOTE — Progress Notes (Unsigned)
"   °  °  Cardiology Office Note Date:  06/20/2024  ID:  Toni Myers, Toni Myers December 24, 1950, MRN [id number] PCP:  Montey Lot, PA-C  Cardiologist:  Joelle VEAR Ren Donley, MD  No chief complaint on file.    Problems Sinus tachycardia CP Recent diagnosis of Bca M: AE2.5, XL100  Visits 12/25 ED: CP 2/26: LP, TTE, CTCA--> ASCVD    Discussed the use of AI scribe software for clinical note transcription with the patient, who gave verbal consent to proceed.  History of Present Illness    ROS: Please see the history of present illness. All other systems are reviewed and negative.    PHYSICAL EXAM: VS:  There were no vitals taken for this visit. , BMI There is no height or weight on file to calculate BMI. GEN: Well nourished, well developed, in no acute distress HEENT: normal Neck: no JVD, carotid bruits, or masses Cardiac: ***RRR; no murmurs, rubs, or gallops,no edema  Respiratory:  CTAB bilaterally, normal work of breathing GI: soft, nontender, nondistended, + BS Extremities: No LE edema Skin: warm and dry, no rash Neuro:  Strength and sensation are intact  EKG: ***  Recent Labs: Reviewed  Studies: Reviewed  Assessment and Plan Assessment & Plan     Signed, Joelle VEAR Ren Donley, MD  06/20/2024 8:08 AM     [facility] "

## 2024-06-28 ENCOUNTER — Ambulatory Visit

## 2024-06-28 ENCOUNTER — Ambulatory Visit: Admitting: Cardiovascular Disease

## 2024-06-28 DIAGNOSIS — R000 Tachycardia, unspecified: Secondary | ICD-10-CM

## 2024-06-28 DIAGNOSIS — I10 Essential (primary) hypertension: Secondary | ICD-10-CM

## 2024-06-28 DIAGNOSIS — R072 Precordial pain: Secondary | ICD-10-CM
